# Patient Record
Sex: Male | Born: 1935 | Race: White | Hispanic: No | Smoking: Former smoker
Health system: Southern US, Community
[De-identification: ages and names within clinical notes are randomized; demographics above are authoritative.]

## PROBLEM LIST (undated history)

## (undated) DIAGNOSIS — Z9289 Personal history of other medical treatment: Secondary | ICD-10-CM

## (undated) DIAGNOSIS — I5189 Other ill-defined heart diseases: Secondary | ICD-10-CM

## (undated) DIAGNOSIS — I471 Supraventricular tachycardia, unspecified: Secondary | ICD-10-CM

## (undated) DIAGNOSIS — J8410 Pulmonary fibrosis, unspecified: Secondary | ICD-10-CM

## (undated) DIAGNOSIS — I951 Orthostatic hypotension: Secondary | ICD-10-CM

## (undated) DIAGNOSIS — I480 Paroxysmal atrial fibrillation: Secondary | ICD-10-CM

## (undated) DIAGNOSIS — E785 Hyperlipidemia, unspecified: Secondary | ICD-10-CM

## (undated) DIAGNOSIS — N400 Enlarged prostate without lower urinary tract symptoms: Secondary | ICD-10-CM

## (undated) DIAGNOSIS — Z950 Presence of cardiac pacemaker: Secondary | ICD-10-CM

## (undated) DIAGNOSIS — I10 Essential (primary) hypertension: Secondary | ICD-10-CM

## (undated) HISTORY — DX: Orthostatic hypotension: I95.1

## (undated) HISTORY — DX: Paroxysmal atrial fibrillation: I48.0

## (undated) HISTORY — DX: Pulmonary fibrosis, unspecified: J84.10

## (undated) HISTORY — DX: Presence of cardiac pacemaker: Z95.0

## (undated) HISTORY — PX: FOOT SURGERY: SHX648

## (undated) HISTORY — DX: Essential (primary) hypertension: I10

## (undated) HISTORY — PX: HERNIA REPAIR: SHX51

## (undated) HISTORY — PX: INSERT / REPLACE / REMOVE PACEMAKER: SUR710

## (undated) HISTORY — DX: Hyperlipidemia, unspecified: E78.5

## (undated) HISTORY — PX: BACK SURGERY: SHX140

---

## 2008-09-21 ENCOUNTER — Ambulatory Visit (HOSPITAL_BASED_OUTPATIENT_CLINIC_OR_DEPARTMENT_OTHER): Admission: RE | Admit: 2008-09-21 | Discharge: 2008-09-21 | Payer: Self-pay | Admitting: Orthopedic Surgery

## 2009-10-22 ENCOUNTER — Ambulatory Visit: Payer: Self-pay | Admitting: Unknown Physician Specialty

## 2009-11-19 ENCOUNTER — Encounter: Payer: Self-pay | Admitting: Cardiovascular Disease

## 2009-12-17 ENCOUNTER — Encounter: Payer: Self-pay | Admitting: Cardiovascular Disease

## 2009-12-31 ENCOUNTER — Ambulatory Visit: Payer: Self-pay | Admitting: Ophthalmology

## 2010-02-06 DIAGNOSIS — I10 Essential (primary) hypertension: Secondary | ICD-10-CM

## 2010-02-06 DIAGNOSIS — E785 Hyperlipidemia, unspecified: Secondary | ICD-10-CM

## 2010-06-07 ENCOUNTER — Emergency Department: Payer: Self-pay | Admitting: Emergency Medicine

## 2010-06-07 ENCOUNTER — Encounter: Payer: Self-pay | Admitting: Cardiovascular Disease

## 2010-06-20 ENCOUNTER — Ambulatory Visit: Payer: Self-pay | Admitting: Cardiovascular Disease

## 2010-06-20 DIAGNOSIS — I471 Supraventricular tachycardia: Secondary | ICD-10-CM

## 2010-07-29 ENCOUNTER — Inpatient Hospital Stay: Payer: Self-pay | Admitting: Internal Medicine

## 2010-08-02 ENCOUNTER — Emergency Department: Payer: Self-pay | Admitting: Emergency Medicine

## 2010-08-06 ENCOUNTER — Emergency Department: Payer: Self-pay | Admitting: Internal Medicine

## 2010-08-13 ENCOUNTER — Emergency Department: Payer: Self-pay | Admitting: Emergency Medicine

## 2010-08-26 ENCOUNTER — Emergency Department: Payer: Self-pay | Admitting: Emergency Medicine

## 2010-10-14 ENCOUNTER — Ambulatory Visit: Payer: Self-pay | Admitting: Cardiovascular Disease

## 2010-11-04 ENCOUNTER — Ambulatory Visit: Payer: Self-pay | Admitting: Ophthalmology

## 2010-12-17 NOTE — Assessment & Plan Note (Signed)
Summary: EKG FOR SURGICAL CLEARANCE  Nurse Visit   Vital Signs:  Patient profile:   77 year old male Height:      69 inches Weight:      165 pounds BMI:     24.45 Pulse rate:   71 / minute BP sitting:   140 / 756  (left arm) Cuff size:   regular  Vitals Entered By: Bishop Dublin, CMA (October 14, 2010 3:23 PM)  Past History:  Past Medical History: Last updated: 02/06/2010 hx of tachypalpitations Hyperlipidemia Hypertension  Past Surgical History: Last updated: 06/20/2010 Foot surg. March 47829. Back surg. (lower back surg.) hernia repair  Family History: Last updated: 06/20/2010 Family History of Coronary Artery Disease:   Social History: Last updated: 06/20/2010 Retired  Married  Tobacco Use - No.  Alcohol Use - no Regular Exercise - yes Drug Use - no  Risk Factors: Exercise: yes (06/20/2010)  Risk Factors: Smoking Status: never (06/20/2010)   Impression & Recommendations:  Problem # 1:  PRE-OPERATIVE CARDIAC EXAM (ICD-V72.81) He is low risk for upcoming cataract surgery. Can proceed without further delay.   His updated medication list for this problem includes:    Cardizem La 180 Mg Xr24h-tab (Diltiazem hcl coated beads) .Marland Kitchen... 1 once daily    Aspirin 81 Mg Tbec (Aspirin) .Marland Kitchen... 1 once daily    Diltiazem Hcl Er Beads 120 Mg Xr24h-cap (Diltiazem hcl er beads) .Marland Kitchen... Take one capsule by mouth daily    Visit Type:  Nurse visit  CC:  EKG for surgical clearance cataract surgery with Dr. Brett Canales Dingeldein at [facility]  Surgery scheduled for Dec. 19 and 2011..   Current Medications (verified): 1)  Prevacid 30 Mg Cpdr (Lansoprazole) .Marland Kitchen.. 1 Once Daily 2)  Cardizem La 180 Mg Xr24h-Tab (Diltiazem Hcl Coated Beads) .Marland Kitchen.. 1 Once Daily 3)  Aspirin 81 Mg Tbec (Aspirin) .Marland Kitchen.. 1 Once Daily 4)  Saw Palmetto 160 Mg Caps (Saw Palmetto (Serenoa Repens)) .Marland Kitchen.. 1 Once Daily 5)  Glucosamine-Chondroitin 500-400 Mg Caps (Glucosamine-Chondroitin) .Marland Kitchen.. 1 Once  Daily 6)  Garlic Oil 1000 Mg Caps (Garlic) .Marland Kitchen.. 1 Once Daily 7)  Centrum Silver  Tabs (Multiple Vitamins-Minerals) .Marland Kitchen.. 1 Once Daily 8)  Calcium-D 600-200 Mg-Unit Tabs (Calcium Carbonate-Vitamin D) .Marland Kitchen.. 1 Once Daily 9)  Vitamin E .... 1 Once Daily 10)  Diltiazem Hcl Er Beads 120 Mg Xr24h-Cap (Diltiazem Hcl Er Beads) .... Take One Capsule By Mouth Daily 11)  Simvastatin 20 Mg Tabs (Simvastatin) .... Take One Tablet By Mouth Daily At Bedtime  Allergies (verified): No Known Drug Allergies  Appended Document: EKG FOR SURGICAL CLEARANCE Faxed nurse visit to [facility] with Dr. Clementeen Hoof for cataract clearance surgery.

## 2010-12-17 NOTE — Letter (Signed)
Summary: Medical Record Release  Medical Record Release   Imported By: Harlon Flor 02/08/2010 08:01:56  _____________________________________________________________________  External Attachment:    Type:   Image     Comment:   External Document

## 2010-12-17 NOTE — Assessment & Plan Note (Signed)
Summary: ROV   Visit Type:  Follow-up Primary [name]:  Dr.Walker  CC:  c/o getting dizzy the other day with nausea and vomiting and had passed out.  Went to [facility] two weeks ago.Marland Kitchen  History of Present Illness: 77 yo gentleman with a h/o of tachypalpitations, well controlled on diltiazem, hyperlipidemia, GERD, esophageal stricture, presenting for new patient evaluation. He was last seen by myself at [facility] on 11/2009.  He reports that he had a recent epsiode of dizziness for which he took NTG x2 thinking it was his heart. He had worked hard mowing the day before in the hot sun. After the NTG, he felt worse, had N/V and called EMS. He believes he had syncope for 30 seconds.  His BP was very low, pulse in the 50s (his baseline). He was evaluated in the ER and discharged hime once he was feeling better. He did not want to stay and be admitted for cardiac eval.  Today he feels well. No further episodes, though his heart rate is in the 50s. He does have rare palpitation on diltiazem 180 mg daily.   EKG: NSR with rate of 60 bpm, RBBB, APC noted  Preventive Screening-Counseling & Management  Alcohol-Tobacco     Smoking Status: never  Caffeine-Diet-Exercise     Does Patient Exercise: yes      Drug Use:  no.    Current Medications (verified): 1)  Prevacid 30 Mg Cpdr (Lansoprazole) .Marland Kitchen.. 1 Once Daily 2)  Cardizem La 180 Mg Xr24h-Tab (Diltiazem Hcl Coated Beads) .Marland Kitchen.. 1 Once Daily 3)  Aspirin 81 Mg Tbec (Aspirin) .Marland Kitchen.. 1 Once Daily 4)  Saw Palmetto 160 Mg Caps (Saw Palmetto (Serenoa Repens)) .Marland Kitchen.. 1 Once Daily 5)  Glucosamine-Chondroitin 500-400 Mg Caps (Glucosamine-Chondroitin) .Marland Kitchen.. 1 Once Daily 6)  Garlic Oil 1000 Mg Caps (Garlic) .Marland Kitchen.. 1 Once Daily 7)  Centrum Silver  Tabs (Multiple Vitamins-Minerals) .Marland Kitchen.. 1 Once Daily 8)  Calcium-D 600-200 Mg-Unit Tabs (Calcium Carbonate-Vitamin D) .Marland Kitchen.. 1 Once Daily 9)  Vitamin E .... 1 Once Daily  Allergies (verified): No Known Drug Allergies  Past  History:  Past Surgical History: Foot surg. March 40981. Back surg. (lower back surg.) hernia repair  Family History: Family History of Coronary Artery Disease:   Social History: Retired  Married  Tobacco Use - No.  Alcohol Use - no Regular Exercise - yes Drug Use - no Smoking Status:  never Does Patient Exercise:  yes Drug Use:  no  Review of Systems  The patient denies fever, weight loss, weight gain, vision loss, decreased hearing, hoarseness, chest pain, syncope, dyspnea on exertion, peripheral edema, prolonged cough, abdominal pain, incontinence, muscle weakness, depression, and enlarged lymph nodes.         recent syncope/near syncope though now resolved  Vital Signs:  Patient profile:   77 year old male Height:      69 inches Weight:      166 pounds BMI:     24.60 Pulse rate:   62 / minute BP sitting:   132 / 73  (left arm) Cuff size:   regular  Vitals Entered By: Bishop Dublin, CMA (June 20, 2010 11:17 AM)  Physical Exam  General:  Well developed, well nourished, in no acute distress. Head:  normocephalic and atraumatic Neck:  Neck supple, no JVD. No masses, thyromegaly or abnormal cervical nodes. Lungs:  Clear bilaterally to auscultation and percussion. Heart:  Non-displaced PMI, chest non-tender; regular rate and rhythm, S1, S2 without murmurs, rubs or gallops. Carotid  upstroke normal, no bruit.  Pedals normal pulses. No edema, no varicosities. Abdomen:  Bowel sounds positive; abdomen soft and non-tender without masses Msk:  Back normal, normal gait. Muscle strength and tone normal. Pulses:  pulses normal in all 4 extremities Extremities:  No clubbing or cyanosis. Neurologic:  Alert and oriented x 3. Skin:  Intact without lesions or rashes. Psych:  Normal affect.   Impression & Recommendations:  Problem # 1:  SVT/ PSVT/ PAT (ICD-427.0) Rare episodes of APCS. Given his recent syncope/near syncope (likely exacerbated by taking NTG sl x2), we wil  decrase his diltazem to 120 mg daily. I have asked him to use his diltiazem 30 mg as needed for palpitations.  His updated medication list for this problem includes:    Cardizem La 180 Mg Xr24h-tab (Diltiazem hcl coated beads) .Marland Kitchen... 1 once daily    Aspirin 81 Mg Tbec (Aspirin) .Marland Kitchen... 1 once daily    Diltiazem Hcl Er Beads 120 Mg Xr24h-cap (Diltiazem hcl er beads) .Marland Kitchen... Take one capsule by mouth daily  Problem # 2:  HYPERTENSION (ICD-401.9) Well controlled on todays visit. We ae decreasing the diltiazem to 120 mg dailt. He is on ASA. Nonsmoker.   His updated medication list for this problem includes:    Cardizem La 180 Mg Xr24h-tab (Diltiazem hcl coated beads) .Marland Kitchen... 1 once daily    Aspirin 81 Mg Tbec (Aspirin) .Marland Kitchen... 1 once daily    Diltiazem Hcl Er Beads 120 Mg Xr24h-cap (Diltiazem hcl er beads) .Marland Kitchen... Take one capsule by mouth daily  Problem # 3:  HYPERLIPIDEMIA (ICD-272.4) We will check his cholesterol at his convenience in our office. He reports he does not have a follow up a this time with Dr. Dan Humphreys.  His updated medication list for this problem includes:    Simvastatin 20 Mg Tabs (Simvastatin) .Marland Kitchen... Take one tablet by mouth daily at bedtime  Other Orders: EKG w/ Interpretation (93000)  Patient Instructions: 1)  Your physician has recommended you make the following change in your medication: START diltiazem 120mg  daily 2)  Your physician wants you to follow-up in:  1 year  You will receive a reminder letter in the mail two months in advance. If you don't receive a letter, please call our office to schedule the follow-up appointment. Prescriptions: DILTIAZEM HCL ER BEADS 120 MG XR24H-CAP (DILTIAZEM HCL ER BEADS) Take one capsule by mouth daily  #90 x 4   Entered by:   Benedict Needy, RN   Authorized by:   Dossie Arbour MD   Signed by:   Benedict Needy, RN on 06/20/2010   Method used:   Electronically to        [facility]* (retail)             ,          Ph: [phone]        Fax: [phone]   RxID:   [id number]

## 2010-12-17 NOTE — Letter (Signed)
Summary: SE NOTE  SE NOTE   Imported By: Frazier Butt Chriscoe 06/07/2010 13:59:56  _____________________________________________________________________  External Attachment:    Type:   Image     Comment:   External Document

## 2010-12-17 NOTE — Consult Note (Signed)
Summary: Consultation Report  Consultation Report   Imported By: Park Breed 06/20/2010 14:20:21  _____________________________________________________________________  External Attachment:    Type:   Image     Comment:   External Document

## 2010-12-30 ENCOUNTER — Encounter: Payer: Self-pay | Admitting: Cardiovascular Disease

## 2011-01-08 NOTE — Miscellaneous (Signed)
Summary: Rx Refill  Clinical Lists Changes  Medications: Changed medication from DILTIAZEM HCL ER BEADS 120 MG XR24H-CAP (DILTIAZEM HCL ER BEADS) Take one capsule by mouth daily to DILTIAZEM HCL ER BEADS 120 MG XR24H-CAP (DILTIAZEM HCL ER BEADS) Take one capsule by mouth daily - Signed Added new medication of CARDIZEM 30 MG TABS (DILTIAZEM HCL) Take one tablet by mouth once daily. - Signed Changed medication from SIMVASTATIN 20 MG TABS (SIMVASTATIN) Take one tablet by mouth daily at bedtime to SIMVASTATIN 20 MG TABS (SIMVASTATIN) Take one tablet by mouth daily at bedtime - Signed Rx of DILTIAZEM HCL ER BEADS 120 MG XR24H-CAP (DILTIAZEM HCL ER BEADS) Take one capsule by mouth daily;  #90 x 3;  Signed;  Entered by: Lanny Hurst RN;  Authorized by: Dossie Arbour MD;  Method used: Print then Give to Patient Rx of CARDIZEM 30 MG TABS (DILTIAZEM HCL) Take one tablet by mouth once daily.;  #90 x 3;  Signed;  Entered by: Lanny Hurst RN;  Authorized by: Dossie Arbour MD;  Method used: Print then Give to Patient Rx of SIMVASTATIN 20 MG TABS (SIMVASTATIN) Take one tablet by mouth daily at bedtime;  #90 x 3;  Signed;  Entered by: Lanny Hurst RN;  Authorized by: Dossie Arbour MD;  Method used: Print then Give to Patient Rx of PREVACID 30 MG CPDR (LANSOPRAZOLE) 1 once daily;  #90 x 3;  Signed;  Entered by: Lanny Hurst RN;  Authorized by: Dossie Arbour MD;  Method used: Print then Give to Patient    Prescriptions: PREVACID 30 MG CPDR (LANSOPRAZOLE) 1 once daily  #90 x 3   Entered by:   Lanny Hurst RN   Authorized by:   Dossie Arbour MD   Signed by:   Lanny Hurst RN on 12/30/2010   Method used:   Print then Give to Patient   RxID:   [id number] SIMVASTATIN 20 MG TABS (SIMVASTATIN) Take one tablet by mouth daily at bedtime  #90 x 3   Entered by:   Lanny Hurst RN   Authorized by:   Dossie Arbour MD   Signed by:   Lanny Hurst RN on 12/30/2010   Method used:   Print then Give to Patient   RxID:   [id number] CARDIZEM  30 MG TABS (DILTIAZEM HCL) Take one tablet by mouth once daily.  #90 x 3   Entered by:   Lanny Hurst RN   Authorized by:   Dossie Arbour MD   Signed by:   Lanny Hurst RN on 12/30/2010   Method used:   Print then Give to Patient   RxID:   [id number] DILTIAZEM HCL ER BEADS 120 MG XR24H-CAP (DILTIAZEM HCL ER BEADS) Take one capsule by mouth daily  #90 x 3   Entered by:   Lanny Hurst RN   Authorized by:   Dossie Arbour MD   Signed by:   Lanny Hurst RN on 12/30/2010   Method used:   Print then Give to Patient   RxID:   [id number]

## 2011-03-10 ENCOUNTER — Telehealth: Payer: Self-pay | Admitting: Cardiovascular Disease

## 2011-03-10 NOTE — Telephone Encounter (Signed)
[facility] has informed that pt that Simvastatin counteracts with Diltiazem.  Can the pt take Lipitor?

## 2011-03-11 ENCOUNTER — Telehealth: Payer: Self-pay

## 2011-03-11 NOTE — Telephone Encounter (Signed)
Looks like he might be overdue for a visit?

## 2011-03-11 NOTE — Telephone Encounter (Signed)
Would continue diltiazem with simva 20. Will discuss with him on next visit.

## 2011-03-11 NOTE — Telephone Encounter (Signed)
Dr. Mariah Milling does this patient need to switch the simvastatin to lipitor?

## 2011-03-12 ENCOUNTER — Telehealth: Payer: Self-pay

## 2011-03-12 MED ORDER — SIMVASTATIN 20 MG PO TABS: 20.0000 mg | ORAL_TABLET | Freq: Every day | ORAL | Status: DC

## 2011-03-12 NOTE — Telephone Encounter (Signed)
Notified patient it is okay for him to take the diltiazem and simvastatin together.

## 2011-03-12 NOTE — Telephone Encounter (Signed)
Notified patient okay to take the diltiazem and simvastatin together.  Also made an appointment with Dr. Mariah Milling for Mar 21, 2011.

## 2011-03-12 NOTE — Telephone Encounter (Signed)
Notified patient need to make an appointment/scheduled for Mar 21, 2011.

## 2011-03-21 ENCOUNTER — Encounter: Payer: Self-pay | Admitting: Cardiovascular Disease

## 2011-03-21 ENCOUNTER — Ambulatory Visit (INDEPENDENT_AMBULATORY_CARE_PROVIDER_SITE_OTHER): Payer: Medicare Other | Admitting: Cardiovascular Disease

## 2011-03-21 DIAGNOSIS — I471 Supraventricular tachycardia: Secondary | ICD-10-CM

## 2011-03-21 DIAGNOSIS — E785 Hyperlipidemia, unspecified: Secondary | ICD-10-CM

## 2011-03-21 DIAGNOSIS — I10 Essential (primary) hypertension: Secondary | ICD-10-CM

## 2011-03-21 NOTE — Progress Notes (Signed)
   Patient ID: Walter Townsend, male    DOB: Apr 05, 1925, 77 y.o.   MRN: [id number]  HPI Comments: 77 yo gentleman with a h/o of tachypalpitations, well controlled on diltiazem, hyperlipidemia, GERD, esophageal stricture, presenting for Routine followup.  He reports that he had been doing well until his wife started having problems. She had confusion and leg weakness. He was helping her after she started to collapse and he pulled his back after trying to lift her. He's using a crutch and has significant left leg discomfort with mild numbness in the upper leg. He is being monitored closely and is on nonsteroidal anti-inflammatories. He has noticed mild improvement in his symptoms. This happened previously and it resolved without intervention.  He does have palpitations that he does appreciate. They are stable though bothersome. He denies any rapid rhythms indicating SVT as he has had in the past. He does have frequent PVCs on diltiazem 180 mg daily.  EKG today shows normal sinus rhythm with right bundle branch block,  PVCs noted.      Review of Systems  Constitutional: Negative.   HENT: Negative.   Eyes: Negative.   Respiratory: Negative.   Cardiovascular: Positive for palpitations.  Gastrointestinal: Negative.   Musculoskeletal: Positive for back pain.  Skin: Negative.   Neurological: Negative.   Hematological: Negative.   Psychiatric/Behavioral: Negative.   [all other systems reviewed and are negative   BP 120/60  Pulse 61  Ht 5\' 9"  (1.753 m)  Wt 162 lb (73.483 kg)  BMI 23.92 kg/m2   Physical Exam  [nursing notereviewed. Constitutional: He is oriented to person, place, and time. He appears well-developed and well-nourished.  HENT:  Head: Normocephalic.  Nose: Nose normal.  Mouth/Throat: Oropharynx is clear and moist.  Eyes: Conjunctivae are normal. Pupils are equal, round, and reactive to light.  Neck: Normal range of motion. Neck supple. No JVD present.  Cardiovascular:  Normal rate, regular rhythm, S1 normal, S2 normal, normal heart sounds and intact distal pulses.  Exam reveals no gallop and no friction rub.   No murmur heard.      Trace edema  Pulmonary/Chest: Effort normal and breath sounds normal. No respiratory distress. He has no wheezes. He has no rales. He exhibits no tenderness.  Abdominal: Soft. Bowel sounds are normal. He exhibits no distension. There is no tenderness.  Musculoskeletal: Normal range of motion. He exhibits no edema and no tenderness.  Lymphadenopathy:    He has no cervical adenopathy.  Neurological: He is alert and oriented to person, place, and time. Coordination normal.  Skin: Skin is warm and dry. No rash noted. No erythema.  Psychiatric: He has a normal mood and affect. His behavior is normal. Judgment and thought content normal.           Assessment and Plan

## 2011-03-21 NOTE — Assessment & Plan Note (Signed)
Given that there is an interaction between diltiazem and simvastatin, he will finish the simvastatin that he has and then changed to Lipitor 20 mg daily. His wife is not taking her Lipitor and he will start her medication until out runs out and then call our office for a refill.

## 2011-03-21 NOTE — Assessment & Plan Note (Signed)
He does continue to have PVCs. These are somewhat bothersome at times. It lasted to contact our office if these get worse.

## 2011-03-21 NOTE — Assessment & Plan Note (Signed)
Blood pressure is well controlled on today's visit. No changes made to the medications. 

## 2011-03-21 NOTE — Patient Instructions (Signed)
You are doing well. No medication changes were made. Please call us if you have new issues that need to be addressed before your next appt.  We will call you for a follow up Appt. In 6 months  

## 2011-04-01 NOTE — Op Note (Signed)
Walter Townsend, Walter Townsend             ACCOUNT NO.:  [id number]   MEDICAL RECORD NO.:  [id number]          PATIENT TYPE:  AMB   LOCATION:  [facility]                          FACILITY:  [facility]   PHYSICIAN:  Loreta Ave, M.D. DATE OF BIRTH:  [birth year]   DATE OF PROCEDURE:  09/21/2008  DATE OF DISCHARGE:                               OPERATIVE REPORT   PREOPERATIVE DIAGNOSIS:  Nonunion shaft fracture, fifth metatarsal,  right foot.   PROCEDURE:  Multiple drilling across nonunion site and fixation with a  4.5-mm cannulated screw.   SURGEON:  Loreta Ave, MD   ASSISTANT:  Genene Churn. Barry Dienes, [place]   ANESTHESIA:  General.   BLOOD LOSS:  Minimal.   SPECIMENS:  None.   CULTURES:  None.   COMPLICATIONS:  None.   PROCEDURE:  Sterile compressive with Cam walker.   TOURNIQUET TIME:  30 minutes.   PROCEDURE:  The patient was brought to the operating room and placed on  the operating table in the supine position.  After adequate anesthesia  had been obtained, tourniquet was applied to right leg.  Prepped and  draped in the usual sterile fashion.  Exsanguinated with elevation and  Esmarch tourniquet inflated.  Fluoroscopic guidance throughout.  A  longitudinal incision at the base of fifth metatarsal.  A guidewire was  then passed across the nonunion site multiple times to break down any  areas of sclerosis.  The guidewire was then passed distally entering  into the shaft confirming good placement on all views fluoroscopically.  Drilled, measured, and then fixed with a 4.0 mm cannulated lag screw.  Countersunk at the base.  Firm fixation with excellent alignment and  compression.  Once this was confirmed, wound was irrigated and closed  with nylon.  Sterile compressive dressing applied.  Cam walker applied.  Tourniquet was deflated and removed.  Anesthesia reversed.  Brought to  the recovery room.  Tolerated the surgery well.  No complications.     Loreta Ave, M.D.  Electronically Signed    DFM/MEDQ  D:  09/21/2008  T:  09/22/2008  Job:  [id number]

## 2011-08-19 LAB — POCT I-STAT, CHEM 8
BUN: 14
Chloride: 108
Creatinine, Ser: 1.2
Glucose, Bld: 109 — ABNORMAL HIGH
Hemoglobin: 14.3
Potassium: 4.3
Sodium: 139

## 2011-09-23 ENCOUNTER — Encounter: Payer: Self-pay | Admitting: Cardiovascular Disease

## 2011-09-25 ENCOUNTER — Encounter: Payer: Self-pay | Admitting: Cardiovascular Disease

## 2011-09-25 ENCOUNTER — Ambulatory Visit (INDEPENDENT_AMBULATORY_CARE_PROVIDER_SITE_OTHER): Payer: Medicare Other | Admitting: Cardiovascular Disease

## 2011-09-25 VITALS — BP 152/74 | HR 72 | Ht 69.0 in | Wt 164.0 lb

## 2011-09-25 DIAGNOSIS — R05 Cough: Secondary | ICD-10-CM

## 2011-09-25 DIAGNOSIS — E785 Hyperlipidemia, unspecified: Secondary | ICD-10-CM

## 2011-09-25 DIAGNOSIS — I471 Supraventricular tachycardia, unspecified: Secondary | ICD-10-CM

## 2011-09-25 DIAGNOSIS — R059 Cough, unspecified: Secondary | ICD-10-CM

## 2011-09-25 DIAGNOSIS — I10 Essential (primary) hypertension: Secondary | ICD-10-CM

## 2011-09-25 DIAGNOSIS — R0602 Shortness of breath: Secondary | ICD-10-CM

## 2011-09-25 DIAGNOSIS — J8410 Pulmonary fibrosis, unspecified: Secondary | ICD-10-CM

## 2011-09-25 MED ORDER — FUROSEMIDE 20 MG PO TABS: 20.0000 mg | ORAL_TABLET | Freq: Every day | ORAL | Status: DC | PRN

## 2011-09-25 NOTE — Patient Instructions (Addendum)
You are doing well. Please start lasix 20mg  every other day for shortness of breath Stop lasix when shortness of breath is better, then take as  Needed  Please call us if you have new issues that need to be addressed before your next appt.  Your physician recommends that you schedule a follow-up appointment in: 3 months  A chest x-ray takes a picture of the organs and structures inside the chest, including the heart, lungs, and blood vessels. This test can show several things, including, whether the heart is enlarges; whether fluid is building up in the lungs. We will call you with results.

## 2011-09-25 NOTE — Assessment & Plan Note (Signed)
Cholesterol is at goal on the current lipid regimen. No changes to the medications were made.  

## 2011-09-25 NOTE — Assessment & Plan Note (Signed)
Worsening shortness of breath. We do not have his old chest x-ray available to [place] for review today. Chest x-ray done today does show bilateral opacities concerning for pulmonary fibrosis or pneumonitis.   We have prescribed Lasix p.r.n. While the x-ray was pending. I'm not sure that this will help a significant amount of could be worth a try for his symptoms. We have suggested he try this every other day for the first week. We will also check a basic metabolic panel and BNP. We will ask him if he would like referral to pulmonology.

## 2011-09-25 NOTE — Assessment & Plan Note (Signed)
Blood pressure is well controlled on today's visit. No changes made to the medications. 

## 2011-09-25 NOTE — Assessment & Plan Note (Signed)
Arrhythmia improved on diltiazem.

## 2011-09-25 NOTE — Progress Notes (Signed)
Patient ID: Walter Townsend, male    DOB: March 03, 1925, 77 y.o.   MRN: [id number]  HPI Comments: 77 yo gentleman with a h/o of tachypalpitations, well controlled on diltiazem, hyperlipidemia, GERD, esophageal stricture, Presenting for routine followup.  He reports that his wife passed away last night. She has been in hospice for several months now. She has vascular dementia and finally succumbed. He was taking care of her at home for several months before she moved back to hospice recently.  He does report increasing shortness of breath over the past several weeks. He denies any significant change in his edema but he does have trace bilateral edema. He does drink a reasonable amount of fluids and reports that he urinates frequently but he attributes this to his prostate. He only smoked for 4 years as a teenager but did have significant secondhand smoke.   Today he feels well. No further episodes, though his heart rate is in the 50s. He does have rare palpitation on diltiazem 180 mg daily.   EKG: NSR with rate of 72 bpm, RBBB, APC noted in Trigeminal pattern    Outpatient Encounter Prescriptions as of 09/25/2011  Medication Sig Dispense Refill  . aspirin 81 MG EC tablet Take 81 mg by mouth daily.        . Calcium Carbonate-Vitamin D (CALCIUM-VITAMIN D) 600-200 MG-UNIT CAPS Take 1 capsule by mouth daily.        Marland Kitchen diltiazem (CARDIZEM CD) 180 MG 24 hr capsule Take 180 mg by mouth daily as needed.        . diltiazem (CARDIZEM) 30 MG tablet Take 30 mg by mouth 1 dose over 46 hours.        Marland Kitchen diltiazem (DILACOR XR) 120 MG 24 hr capsule Take 120 mg by mouth daily.        . Garlic Oil 1000 MG CAPS Take 1 capsule by mouth daily.        Marland Kitchen glucosamine-chondroitin 500-400 MG tablet Take 1 tablet by mouth daily.        . lansoprazole (PREVACID) 30 MG capsule Take 30 mg by mouth daily.        . Multiple Vitamin (MULTIVITAMIN) tablet Take 1 tablet by mouth daily.        . saw palmetto 160 MG capsule Take 160  mg by mouth daily.        . simvastatin (ZOCOR) 20 MG tablet Take 20 mg by mouth at bedtime.        . vitamin E 1000 UNIT capsule Take 1,000 Units by mouth daily.          Review of Systems  Constitutional: Negative.   HENT: Negative.   Eyes: Negative.   Respiratory: Positive for shortness of breath.   Cardiovascular: Negative.   Gastrointestinal: Negative.   Musculoskeletal: Negative.   Skin: Negative.   Neurological: Negative.   Hematological: Negative.   Psychiatric/Behavioral: Negative.   All other systems reviewed and are negative.    BP 152/74  Pulse 72  Ht 5\' 9"  (1.753 m)  Wt 164 lb (74.39 kg)  BMI 24.22 kg/m2  Physical Exam  Nursing note and vitals reviewed. Constitutional: He is oriented to person, place, and time. He appears well-developed and well-nourished.  HENT:  Head: Normocephalic.  Nose: Nose normal.  Mouth/Throat: Oropharynx is clear and moist.  Eyes: Conjunctivae are normal. Pupils are equal, round, and reactive to light.  Neck: Normal range of motion. Neck supple. No JVD present.  Cardiovascular: Normal rate,  regular rhythm, S1 normal, S2 normal, normal heart sounds and intact distal pulses.  Exam reveals no gallop and no friction rub.   No murmur heard. Pulmonary/Chest: Effort normal. No respiratory distress. He has no wheezes. He has rales. He exhibits no tenderness.       Most notably in the right base  Abdominal: Soft. Bowel sounds are normal. He exhibits no distension. There is no tenderness.  Musculoskeletal: Normal range of motion. He exhibits no edema and no tenderness.  Lymphadenopathy:    He has no cervical adenopathy.  Neurological: He is alert and oriented to person, place, and time. Coordination normal.  Skin: Skin is warm and dry. No rash noted. No erythema.  Psychiatric: He has a normal mood and affect. His behavior is normal. Judgment and thought content normal.           Assessment and Plan

## 2011-09-26 LAB — BASIC METABOLIC PANEL
BUN: 17 mg/dL (ref 8–27)
CO2: 20 mmol/L (ref 20–32)
Calcium: 9.7 mg/dL (ref 8.6–10.2)
Chloride: 101 mmol/L (ref 97–108)
Glucose: 100 mg/dL — ABNORMAL HIGH (ref 65–99)
Potassium: 4.4 mmol/L (ref 3.5–5.2)

## 2011-09-26 LAB — BRAIN NATRIURETIC PEPTIDE: BNP: 87.9 pg/mL (ref 0.0–100.0)

## 2011-10-06 ENCOUNTER — Encounter: Payer: Self-pay | Admitting: Pulmonary Disease

## 2011-10-06 ENCOUNTER — Ambulatory Visit (INDEPENDENT_AMBULATORY_CARE_PROVIDER_SITE_OTHER): Payer: Medicare Other | Admitting: Pulmonary Disease

## 2011-10-06 VITALS — BP 112/66 | HR 70 | Temp 97.9°F | Ht 69.0 in | Wt 167.0 lb

## 2011-10-06 DIAGNOSIS — J8410 Pulmonary fibrosis, unspecified: Secondary | ICD-10-CM

## 2011-10-06 NOTE — Patient Instructions (Signed)
You have pulmonary fibrosis or scarring in the lungs.   To help [place] better evaluate this, you need to have a CT chest, a barium swallow, and full pulmonary function tests.  We would prefer to do these tests this week, but per your wishes we will order then for early January, 2013.  We will see you back in January 2013.

## 2011-10-06 NOTE — Assessment & Plan Note (Signed)
Mr. Jarrard has dyspnea, cough, crackles on exam and imaging findings worrisome for pulmonary fibrosis.  I do not believe that he is volume overloaded on exam today, and a recent BNP sent by Dr. Mariah Milling was less than 100.  I explained to Mr. Deboer that I believe he likely does have some degree of pulmonary fibrosis and to better characterize it he should have a CT thorax and full PFTs.  He has no compelling evidence of an underlying connective tissue disorder with the exception of the prior esophageal disease.  Chronic aspiration is a reasonable consideration considering this.    I offered a CT thorax, full PFTs, and a barium swallow to be performed this week with follow up in one week, but unfortunately Mr. Meddaugh declined.  He believes that he is just out of shape and needs to focus on caring for his sister right now.  He wants to put off the work up until after Christmas.  I spent a considerable amount of time in clinic advising him that I thought an immediate work up was appropriate but despite my best efforts he refused.  I advised him that we would be happy to see him sooner than January 2013 if he changes his mind, and he said that he would think about it.

## 2011-10-06 NOTE — Progress Notes (Signed)
Subjective:    Patient ID: Walter Townsend, male    DOB: 1925/01/31, 77 y.o.   MRN: [id number]  HPI 77 y/o male referred for possible pulmonary fibrosis.    He states that he was first told that he had an abnormal chest exam 4-5 years ago.  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  He continued to remain asymptomatic, exercising by walking up to two miles a day until April of 2011.  At that time he broke his R foot and stopped exercising so much.  After recovering his wife developed dementia and for the majority of 2012 he has been caring for her daily.  This has involved direct care of her basic needs during which time he has neglected to exercise.  Sadly, she died on Oct 05, 2011 which he says has been causing him stress but he has been focusing on caring for other family members to help take his mind off of it.  Three weeks ago he tried to walk on the treadmill and found that he was dyspnic after only five minutes.  He mentioned this to Walter Townsend (with whom he follows for SVT) and a CXR was ordered which showed interstitial pneumonitis vs. Fibrosis.  He states that he can walk for five minutes at a time without getting short of breath, but still is capable of doing his ADL's and yardwork.  He has had a dry cough since mulching leaves one month ago.  No fevers, chills, chest pain or significant swelling.   He states that in years prior he has had trouble with food "hanging" in his esophagus, but to his knowledge this was cured by prevacid alone.  He denies choking or dysphagia.  Past Medical History  Diagnosis Date  . Rapid palpitations   . Hyperlipidemia   . Hypertension      Family History  Problem Relation Age of Onset  . Heart disease Other   . Coronary artery disease Other      History   Social History  . Marital Status: Married    Spouse Name: N/A    Number of  Children: N/A  . Years of Education: N/A   Occupational History  . Not on file.   Social History Main Topics  . Smoking status: Former Smoker -- 0.3 packs/day for 3 years    Types: Cigarettes    Quit date: 11/17/1948  . Smokeless tobacco: Never Used  . Alcohol Use: No  . Drug Use: No  . Sexually Active: Not on file   Other Topics Concern  . 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  He as never used pesticides.  His house is not moldy, he has no pets.       No Known Allergies   Outpatient Prescriptions Prior to Visit  Medication Sig Dispense Refill  . aspirin 81 MG EC tablet Take 81 mg by mouth daily.        . Calcium Carbonate-Vitamin D (CALCIUM-VITAMIN D) 600-200 MG-UNIT CAPS Take 1 capsule by mouth daily.        Marland Kitchen  diltiazem (CARDIZEM CD) 180 MG 24 hr capsule Take 180 mg by mouth daily as needed.        . diltiazem (CARDIZEM) 30 MG tablet Take 30 mg by mouth 1 dose over 46 hours.        Marland Kitchen diltiazem (DILACOR XR) 120 MG 24 hr capsule Take 120 mg by mouth daily.        . furosemide (LASIX) 20 MG tablet Take 1 tablet (20 mg total) by mouth daily as needed.  30 tablet  11  . Garlic Oil 1000 MG CAPS Take 1 capsule by mouth daily.        Marland Kitchen glucosamine-chondroitin 500-400 MG tablet Take 1 tablet by mouth daily.        . lansoprazole (PREVACID) 30 MG capsule Take 30 mg by mouth daily.        . Multiple Vitamin (MULTIVITAMIN) tablet Take 1 tablet by mouth daily.        . saw palmetto 160 MG capsule Take 160 mg by mouth daily.        . simvastatin (ZOCOR) 20 MG tablet Take 20 mg by mouth at bedtime.        . vitamin E 1000 UNIT capsule Take 1,000 Units by mouth daily.              Review of Systems    Constitutional: Positive for activity change. Negative for fever, chills, appetite change and unexpected weight change.  HENT: Negative for congestion, sore throat, rhinorrhea, sneezing, trouble swallowing, dental problem, voice change and postnasal drip.   Eyes: Negative for visual disturbance.  Respiratory: Positive for cough and shortness of breath. Negative for choking.   Cardiovascular: Negative for chest pain and leg swelling.  Gastrointestinal: Negative for nausea, vomiting and abdominal pain.  Genitourinary: Negative for difficulty urinating.  Musculoskeletal: Positive for arthralgias.  Skin: Negative for rash.  Psychiatric/Behavioral: Negative for behavioral problems and confusion.       Objective:   Physical Exam  Filed Vitals:   10/06/11 1447  BP: 112/66  Pulse: 70  Temp: 97.9 F (36.6 C)  TempSrc: Oral  Height: 5\' 9"  (1.753 m)  Weight: 167 lb (75.751 kg)  SpO2: 97%   He walked for two minutes in the office and his O2 sats did not drop below 90%.  Gen: well appearing, no acute distress HEENT: NCAT, PERRL, EOMi, OP clear, neck supple without masses PULM: Insp crackles throughout bilateral bases CV: RRR, systolic murmur left upper sternal border, no JVD AB: BS+, soft, nontender, no hsm Ext: warm, no edema, no clubbing, no cyanosis Derm: no rash or skin breakdown Neuro: A&Ox4, CN II-XII intact, strength 5/5 in all 4 extremeties      Assessment & Plan:   Pulmonary fibrosis Walter Townsend has dyspnea, cough, crackles on exam and imaging findings worrisome for pulmonary fibrosis.  I do not believe that he is volume overloaded on exam today, and a recent BNP sent by Walter Townsend was less than 100.  I explained to Walter Townsend that I believe he likely does have some degree of pulmonary fibrosis and to better characterize it he should have a CT thorax and full PFTs.  He has no compelling evidence of an underlying connective tissue disorder with the exception of the prior  esophageal disease.  Chronic aspiration is a reasonable consideration considering this.    I offered a CT thorax, full PFTs, and a barium swallow to be performed this week with follow up in one week,  but unfortunately Mr. Garriga declined.  He believes that he is just out of shape and needs to focus on caring for his sister right now.  He wants to put off the work up until after Christmas.  I spent a considerable amount of time in clinic advising him that I thought an immediate work up was appropriate but despite my best efforts he refused.  I advised him that we would be happy to see him sooner than January 2013 if he changes his mind, and he said that he would think about it.    Updated Medication List Outpatient Encounter Prescriptions as of 10/06/2011  Medication Sig Dispense Refill  . aspirin 81 MG EC tablet Take 81 mg by mouth daily.        . Calcium Carbonate-Vitamin D (CALCIUM-VITAMIN D) 600-200 MG-UNIT CAPS Take 1 capsule by mouth daily.        Marland Kitchen diltiazem (CARDIZEM CD) 180 MG 24 hr capsule Take 180 mg by mouth daily as needed.        . diltiazem (CARDIZEM) 30 MG tablet Take 30 mg by mouth. As directed      . diltiazem (DILACOR XR) 120 MG 24 hr capsule Take 120 mg by mouth daily. As directed      . furosemide (LASIX) 20 MG tablet Take 1 tablet (20 mg total) by mouth daily as needed.  30 tablet  11  . Garlic Oil 1000 MG CAPS Take 1 capsule by mouth daily.        Marland Kitchen glucosamine-chondroitin 500-400 MG tablet Take 1 tablet by mouth daily.        . lansoprazole (PREVACID) 30 MG capsule Take 30 mg by mouth daily.        . Multiple Vitamin (MULTIVITAMIN) tablet Take 1 tablet by mouth daily.        . saw palmetto 160 MG capsule Take 160 mg by mouth daily.        . simvastatin (ZOCOR) 20 MG tablet Take 20 mg by mouth at bedtime.        . vitamin E 1000 UNIT capsule Take 1,000 Units by mouth daily.

## 2011-10-07 ENCOUNTER — Telehealth: Payer: Self-pay | Admitting: Pulmonary Disease

## 2011-10-07 DIAGNOSIS — J8410 Pulmonary fibrosis, unspecified: Secondary | ICD-10-CM

## 2011-10-07 DIAGNOSIS — R0602 Shortness of breath: Secondary | ICD-10-CM

## 2011-10-07 NOTE — Telephone Encounter (Signed)
I spoke with pt and he states he is ready to have the ct chest, barium swallow, and pft set up. Pt states he will try to get these worked around his schedule. Pt is already scheduled for PFT 11/30 at 12:00 and is aware it's in our [place] office and is aware of address and phone #. Pt states he would like the other 2 test set up over at [facility] if possible. Does the CT need to be with/without contrast and would you like the barium swallow modified? Please advise Dr. Kendrick Fries, thanks

## 2011-10-07 NOTE — Telephone Encounter (Signed)
The CT can be non-contrast only and I would like a regular upper GI barium swallow, not a modified barium swallow.  Thanks.

## 2011-10-08 ENCOUNTER — Telehealth: Payer: Self-pay | Admitting: Pulmonary Disease

## 2011-10-08 DIAGNOSIS — R0602 Shortness of breath: Secondary | ICD-10-CM

## 2011-10-08 NOTE — Telephone Encounter (Signed)
I spoke with [facility] and was advised the 1st order for barium swallow was incorrect. I was advised it needs to be dg esophagus. I have corrected order. Please advise pcc, thanks

## 2011-10-08 NOTE — Telephone Encounter (Signed)
All orders have been placed. Will forward to [facility] so they can call pt first before setting pt's apt up. Please advise pcc, thanks

## 2011-10-13 ENCOUNTER — Ambulatory Visit (HOSPITAL_COMMUNITY)
Admission: RE | Admit: 2011-10-13 | Discharge: 2011-10-13 | Disposition: A | Discharge status: Home / Self Care | Payer: Medicare Other | Source: Ambulatory Visit | Attending: Pulmonary Disease | Admitting: Pulmonary Disease

## 2011-10-13 DIAGNOSIS — R0602 Shortness of breath: Secondary | ICD-10-CM | POA: Insufficient documentation

## 2011-10-13 DIAGNOSIS — K449 Diaphragmatic hernia without obstruction or gangrene: Secondary | ICD-10-CM | POA: Insufficient documentation

## 2011-10-13 DIAGNOSIS — K219 Gastro-esophageal reflux disease without esophagitis: Secondary | ICD-10-CM | POA: Insufficient documentation

## 2011-10-13 DIAGNOSIS — J8410 Pulmonary fibrosis, unspecified: Secondary | ICD-10-CM | POA: Insufficient documentation

## 2011-10-13 DIAGNOSIS — I2510 Atherosclerotic heart disease of native coronary artery without angina pectoris: Secondary | ICD-10-CM | POA: Insufficient documentation

## 2011-10-17 ENCOUNTER — Encounter (INDEPENDENT_AMBULATORY_CARE_PROVIDER_SITE_OTHER): Payer: Medicare Other | Admitting: Pulmonary Disease

## 2011-10-17 DIAGNOSIS — R0602 Shortness of breath: Secondary | ICD-10-CM

## 2011-10-17 LAB — PULMONARY FUNCTION TEST

## 2011-10-27 ENCOUNTER — Ambulatory Visit (INDEPENDENT_AMBULATORY_CARE_PROVIDER_SITE_OTHER): Payer: Medicare Other | Admitting: Pulmonary Disease

## 2011-10-27 ENCOUNTER — Encounter: Payer: Self-pay | Admitting: Pulmonary Disease

## 2011-10-27 DIAGNOSIS — K219 Gastro-esophageal reflux disease without esophagitis: Secondary | ICD-10-CM

## 2011-10-27 DIAGNOSIS — R05 Cough: Secondary | ICD-10-CM

## 2011-10-27 DIAGNOSIS — J8410 Pulmonary fibrosis, unspecified: Secondary | ICD-10-CM

## 2011-10-27 NOTE — Assessment & Plan Note (Signed)
We reviewed dietary and lifestyle modifications to help with his reflux today.  He should continue taking his PPI.  Definitive therapy may be a Nissen, as he has overt reflux on his barium swallow I'm not sure what further information a 24 hour pH probe would add.

## 2011-10-27 NOTE — Patient Instructions (Signed)
You have pulmonary fibrosis with normal lung function tests.   You should continue to walk on the treadmill or ride your bicycle every day for exercise. You can use mucinex 2-3 times per day as needed for cough and congestion. Use Ocean nasal spray 2-3 times per day for sinus congestion.  If this doesn't help, try using Lloyd Huger Med rinses 2-3 times per day with distilled water.  We will see you back in 3 months.

## 2011-10-27 NOTE — Progress Notes (Signed)
Subjective:    Patient ID: Walter Townsend, male    DOB: 01-Nov-1925, 77 y.o.   MRN: [id number]  HPIMr. Brenson returns for a follow up visit regarding his pulmonary fibrosis.  He has been exercising more on his treadmill and walked 1/2 mile yesterday.  He notes a cough which is occasionally productive of yellow thick sputum and is improved with mucinex.  He has sinus congestion throughout the day despite the use of what sounds like a nasal steroid (we don't have the drug on record).  Otherwise he is doing well.   Review of Systems     Objective:   Physical Exam Filed Vitals:   10/27/11 1314  BP: 158/80  Pulse: 77  Temp: 97.7 F (36.5 C)  TempSrc: Oral  Height: 5' 8.5" (1.74 m)  Weight: 169 lb (76.658 kg)  SpO2: 96%    Gen: no acute distress HEENT: NCAT, PERRL, EOMi, OP clear, neck supple without masses PULM: Insp crackles 1/2 way up bilaterally CV: RRR, no mgr, no JVD AB:  nontender, no distension, no hsm Ext: warm, no edema, no clubbing, no cyanosis Derm: no rash or skin breakdown Neuro: A&Ox4, CN II-XII intact, strength 5/5 in all 4 extremities   09/2011 CT Thorax: pulmonary fibrosis most in a UIP like pattern.  09/2011 Barium swallow: IMPRESSION:  1. Single episode of silent aspiration observed.  2. Mild presbyesophagus with hiatal hernia and significant  gastroesophageal reflux.  3. No evidence of esophagitis, stricture or ulceration.  Full PFT's: normal lung function and DLCO    Assessment & Plan:   Pulmonary fibrosis Mr. Fynn CT scan was described as a UIP like pattern because of the peripheral and basal interstitial thickening and scattered ground glass.   I believe that this is more likely due to aspiration given the findings from the barium swallow.  Regardless the cause, his lung function is excellent (volumes, DLCO, etc) so I think that it is more likely that his dyspnea is due to deconditioning.    At this point he does not need oxygen therapy, but  he should exercise more.  I think the best treatment plan at this point is to review reflux lifestyle changes and to continue taking his PPI at home.    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  He says that he has known that he has had fibrosis for years, so in all likelihood this will not progress rapidly.  We will monitor him every three months with office visits and PFT's every 6 months for now.    Cough I have suggested that he use guaifenesin and sinus rinses to help with his cough.   Reflux We reviewed dietary and lifestyle modifications to help with his reflux today.  He should continue taking his PPI.  Definitive therapy may be a Nissen, as he has overt reflux on his barium swallow I'm not sure what further information a 24 hour pH probe would add.    Updated Medication List Outpatient Encounter Prescriptions as of 10/27/2011  Medication Sig Dispense Refill  . aspirin 81 MG EC tablet Take 81 mg by mouth daily.        . Calcium Carbonate-Vitamin D (CALCIUM-VITAMIN D) 600-200 MG-UNIT CAPS Take 1 capsule by mouth daily.        Marland Kitchen diltiazem (CARDIZEM CD) 180 MG 24 hr capsule Take 180 mg by mouth. Daily as directed per Dr Mariah Milling      . diltiazem (  CARDIZEM) 30 MG tablet Take 30 mg by mouth. As directed      . diltiazem (DILACOR XR) 120 MG 24 hr capsule Take 120 mg by mouth daily. As directed      . Garlic Oil 1000 MG CAPS Take 1 capsule by mouth daily.        Marland Kitchen glucosamine-chondroitin 500-400 MG tablet Take 1 tablet by mouth daily.        . lansoprazole (PREVACID) 30 MG capsule Take 30 mg by mouth daily.        . Multiple Vitamin (MULTIVITAMIN) tablet Take 1 tablet by mouth daily.        . saw palmetto 160 MG capsule Take 160 mg by mouth daily.        . simvastatin (ZOCOR) 20 MG tablet Take 20 mg by mouth at bedtime.        . vitamin E 1000 UNIT capsule Take 1,000 Units by mouth daily.        Marland Kitchen DISCONTD: furosemide (LASIX) 20 MG  tablet Take 1 tablet (20 mg total) by mouth daily as needed.  30 tablet  11

## 2011-10-27 NOTE — Assessment & Plan Note (Signed)
I have suggested that he use guaifenesin and sinus rinses to help with his cough.

## 2011-10-27 NOTE — Assessment & Plan Note (Signed)
Mr. Broghan CT scan was described as a UIP like pattern because of the peripheral and basal interstitial thickening and scattered ground glass.   I believe that this is more likely due to aspiration given the findings from the barium swallow.  Regardless the cause, his lung function is excellent (volumes, DLCO, etc) so I think that it is more likely that his dyspnea is due to deconditioning.    At this point he does not need oxygen therapy, but he should exercise more.  I think the best treatment plan at this point is to review reflux lifestyle changes and to continue taking his PPI at home.    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  He says that he has known that he has had fibrosis for years, so in all likelihood this will not progress rapidly.  We will monitor him every three months with office visits and PFT's every 6 months for now.

## 2011-11-04 ENCOUNTER — Encounter: Payer: Self-pay | Admitting: Pulmonary Disease

## 2011-12-24 ENCOUNTER — Ambulatory Visit (INDEPENDENT_AMBULATORY_CARE_PROVIDER_SITE_OTHER): Payer: Medicare Other | Admitting: Cardiovascular Disease

## 2011-12-24 ENCOUNTER — Encounter: Payer: Self-pay | Admitting: Cardiovascular Disease

## 2011-12-24 VITALS — BP 143/84 | HR 61 | Ht 69.5 in | Wt 169.0 lb

## 2011-12-24 DIAGNOSIS — I471 Supraventricular tachycardia, unspecified: Secondary | ICD-10-CM

## 2011-12-24 DIAGNOSIS — R0602 Shortness of breath: Secondary | ICD-10-CM

## 2011-12-24 DIAGNOSIS — E785 Hyperlipidemia, unspecified: Secondary | ICD-10-CM

## 2011-12-24 DIAGNOSIS — R9431 Abnormal electrocardiogram [ECG] [EKG]: Secondary | ICD-10-CM

## 2011-12-24 DIAGNOSIS — J8410 Pulmonary fibrosis, unspecified: Secondary | ICD-10-CM

## 2011-12-24 MED ORDER — LANSOPRAZOLE 30 MG PO CPDR: 30.0000 mg | DELAYED_RELEASE_CAPSULE | Freq: Every day | ORAL | Status: DC

## 2011-12-24 MED ORDER — DILTIAZEM HCL 30 MG PO TABS: 30.0000 mg | ORAL_TABLET | Freq: Every day | ORAL | Status: DC | PRN

## 2011-12-24 MED ORDER — ATORVASTATIN CALCIUM 20 MG PO TABS: 20.0000 mg | ORAL_TABLET | Freq: Every day | ORAL | Status: DC

## 2011-12-24 NOTE — Assessment & Plan Note (Signed)
No significant symptoms of palpitations. He is concerned about his low blood pressure with exercise. We have suggested he hold the diltiazem 30 mg and take the 120 mg pill after exercise.

## 2011-12-24 NOTE — Progress Notes (Signed)
Patient ID: Walter Townsend, male    DOB: [birth year], 78 y.o.   MRN: [id number]  HPI Comments: 78 yo gentleman with a h/o of tachypalpitations, well controlled on diltiazem, hyperlipidemia, GERD, esophageal stricture, worsening SOB, dx with pulmonary fibrosis,  Presenting for routine followup.  He reports that his shortness of breath has been very limiting. He is unable to treadmill very far and has to stop secondary to his breathing. His energy is poor. He feels that something is wrong. He is concerned that his blood pressure is low when he is exercising. We previously decreased the diltiazem from 180 mg to 120 mg daily and he does take a 30 mg tablet later in the day for breakthrough palpitations.  He denies any significant change in his edema.  He only smoked for 4 years as a teenager but did have significant secondhand smoke.   Today he feels well. No further episodes, though his heart rate is in the 50s.    EKG: NSR with rate of 72 bpm, RBBB    Outpatient Encounter Prescriptions as of 12/24/2011  Medication Sig Dispense Refill  . aspirin 81 MG EC tablet Take 81 mg by mouth daily.        . Calcium Carbonate-Vitamin D (CALCIUM-VITAMIN D) 600-200 MG-UNIT CAPS Take 1 capsule by mouth daily.        Marland Kitchen diltiazem (DILACOR XR) 120 MG 24 hr capsule Take 120 mg by mouth daily. As directed      . Garlic Oil 1000 MG CAPS Take 1 capsule by mouth daily.        Marland Kitchen glucosamine-chondroitin 500-400 MG tablet Take 1 tablet by mouth daily.        . lansoprazole (PREVACID) 30 MG capsule Take 1 capsule (30 mg total) by mouth daily.  90 capsule  4  . Multiple Vitamin (MULTIVITAMIN) tablet Take 1 tablet by mouth daily.        . saw palmetto 160 MG capsule Take 160 mg by mouth daily.        . vitamin E 400 UNIT capsule Take 400 Units by mouth daily.      Marland Kitchen  diltiazem (CARDIZEM) 30 MG tablet Take 30 mg by mouth. As directed      . lansoprazole (PREVACID) 30 MG capsule Take 30 mg by mouth daily.        Marland Kitchen  atorvastatin (LIPITOR) 20 MG tablet Take 1 tablet (20 mg total) by mouth daily.  90 tablet  4    Review of Systems  Constitutional: Negative.   HENT: Negative.   Eyes: Negative.   Respiratory: Positive for shortness of breath.   Cardiovascular: Negative.   Gastrointestinal: Negative.   Musculoskeletal: Negative.   Skin: Negative.   Neurological: Negative.   Hematological: Negative.   Psychiatric/Behavioral: Negative.   All other systems reviewed and are negative.    BP 143/84  Pulse 61  Ht 5' 9.5" (1.765 m)  Wt 169 lb (76.658 kg)  BMI 24.60 kg/m2  Physical Exam  Nursing note and vitals reviewed. Constitutional: He is oriented to person, place, and time. He appears well-developed and well-nourished.  HENT:  Head: Normocephalic.  Nose: Nose normal.  Mouth/Throat: Oropharynx is clear and moist.  Eyes: Conjunctivae are normal. Pupils are equal, round, and reactive to light.  Neck: Normal range of motion. Neck supple. No JVD present.  Cardiovascular: Normal rate, regular rhythm, S1 normal, S2 normal, normal heart sounds and intact distal pulses.  Exam reveals no gallop and no friction  rub.   No murmur heard. Pulmonary/Chest: Effort normal. No respiratory distress. He has no wheezes. He has rales. He exhibits no tenderness.       Most notably in the right base  Abdominal: Soft. Bowel sounds are normal. He exhibits no distension. There is no tenderness.  Musculoskeletal: Normal range of motion. He exhibits no edema and no tenderness.  Lymphadenopathy:    He has no cervical adenopathy.  Neurological: He is alert and oriented to person, place, and time. Coordination normal.  Skin: Skin is warm and dry. No rash noted. No erythema.  Psychiatric: He has a normal mood and affect. His behavior is normal. Judgment and thought content normal.           Assessment and Plan

## 2011-12-24 NOTE — Assessment & Plan Note (Signed)
We have suggested he continue his Lipitor 20 mg daily.

## 2011-12-24 NOTE — Patient Instructions (Addendum)
You are doing well. Hold the diltiazem 30 mg and stay on diltiazem 120 mg a day (take after your exercise) Start the lipitor 20 mg daily  We have ordered a stress test to examine your shortness of breath The day before the stress test and the day of the test, do not take diltiazem  Please call us if you have new issues that need to be addressed before your next appt.  Your physician wants you to follow-up in: 6 months.  You will receive a reminder letter in the mail two months in advance. If you don't receive a letter, please call our office to schedule the follow-up appointment.  Your physician has requested that you have a lexiscan myoview. For further information please visit [email or web address] Please follow instruction sheet, as given.

## 2011-12-24 NOTE — Assessment & Plan Note (Signed)
We have ordered a stress test for his progressive shortness of breath. He is very symptomatic. Underlying abnormal EKG, history of hyperlipidemia and hypertension. Remote smoking history.

## 2011-12-24 NOTE — Assessment & Plan Note (Signed)
Stress test has been ordered to rule out ischemia as a cause of his shortness of breath. Yesterday possible his shortness of breath could be secondary to his pulmonary fibrosis though he does not report having hypoxia with exertion.

## 2011-12-26 ENCOUNTER — Telehealth: Payer: Self-pay

## 2011-12-26 NOTE — Telephone Encounter (Signed)
No precert required for Lexiscan.

## 2011-12-26 NOTE — Telephone Encounter (Signed)
Message copied by Festus Aloe on Fri Dec 26, 2011  8:57 AM ------      Message from: Carmelina Paddock      Created: Fri Dec 26, 2011  8:29 AM      Regarding: lexiscan @ [facility] 12/30/11       No precert required per this pt/s [facility] policy                  Thanks,            [employer]

## 2011-12-30 ENCOUNTER — Ambulatory Visit: Payer: Self-pay | Admitting: Cardiovascular Disease

## 2011-12-30 DIAGNOSIS — R079 Chest pain, unspecified: Secondary | ICD-10-CM

## 2012-01-02 ENCOUNTER — Encounter: Payer: Self-pay | Admitting: Cardiovascular Disease

## 2012-04-08 ENCOUNTER — Other Ambulatory Visit: Payer: Self-pay | Admitting: Cardiovascular Disease

## 2012-04-08 MED ORDER — DILTIAZEM HCL ER COATED BEADS 120 MG PO CP24: 120.0000 mg | ORAL_CAPSULE | Freq: Every day | ORAL | Status: DC

## 2012-04-08 NOTE — Telephone Encounter (Signed)
Refilled Diltiazem. 

## 2012-06-23 ENCOUNTER — Encounter: Payer: Self-pay | Admitting: Cardiovascular Disease

## 2012-06-23 ENCOUNTER — Ambulatory Visit (INDEPENDENT_AMBULATORY_CARE_PROVIDER_SITE_OTHER): Payer: Medicare Other | Admitting: Cardiovascular Disease

## 2012-06-23 VITALS — BP 130/80 | HR 63 | Ht 69.5 in | Wt 164.5 lb

## 2012-06-23 DIAGNOSIS — E785 Hyperlipidemia, unspecified: Secondary | ICD-10-CM

## 2012-06-23 DIAGNOSIS — I10 Essential (primary) hypertension: Secondary | ICD-10-CM

## 2012-06-23 DIAGNOSIS — I471 Supraventricular tachycardia: Secondary | ICD-10-CM

## 2012-06-23 DIAGNOSIS — J8410 Pulmonary fibrosis, unspecified: Secondary | ICD-10-CM

## 2012-06-23 MED ORDER — ATORVASTATIN CALCIUM 20 MG PO TABS: 20.0000 mg | ORAL_TABLET | Freq: Every day | ORAL | Status: DC

## 2012-06-23 MED ORDER — LANSOPRAZOLE 30 MG PO CPDR: 30.0000 mg | DELAYED_RELEASE_CAPSULE | Freq: Every day | ORAL | Status: DC

## 2012-06-23 MED ORDER — DILTIAZEM HCL ER COATED BEADS 120 MG PO CP24: 120.0000 mg | ORAL_CAPSULE | Freq: Every day | ORAL | Status: DC

## 2012-06-23 NOTE — Assessment & Plan Note (Signed)
Blood pressure is well controlled on today's visit. No changes made to the medications. 

## 2012-06-23 NOTE — Assessment & Plan Note (Signed)
He denies any tachycardia or palpitations on his current medication regimen. No changes made.

## 2012-06-23 NOTE — Assessment & Plan Note (Signed)
Shortness of breath is stable, in fact he feels it is improved with exercise. He is doing his own pulmonary rehabilitation through his activity

## 2012-06-23 NOTE — Assessment & Plan Note (Signed)
We have suggested he stay on Lipitor. We'll try to obtain his most recent lipid panel for our records.

## 2012-06-23 NOTE — Progress Notes (Signed)
Patient ID: Walter Townsend, male    DOB: November 29, 1924, 78 y.o.   MRN: [id number]  HPI Comments: 78 yo gentleman with a h/o of tachypalpitations, well controlled on diltiazem, hyperlipidemia, GERD, esophageal stricture, worsening SOB, dx with pulmonary fibrosis,  Presenting for routine followup.  Since his last clinic visit, he has been doing more exercise, walking and biking. He feels well and his breathing has improved. He has not had followup with pulmonary as he does feel better. He does notice bradycardia at nighttime after he takes his medications but he is not very symptomatic. Otherwise he feels well with no complaints.   He only smoked for 4 years as a teenager but did have significant secondhand smoke.   EKG: NSR with rate of 63 bpm, RBBB, rare PVC    Outpatient Encounter Prescriptions as of 06/23/2012  Medication Sig Dispense Refill  . aspirin 81 MG EC tablet Take 81 mg by mouth daily.        Marland Kitchen atorvastatin (LIPITOR) 20 MG tablet Take 1 tablet (20 mg total) by mouth daily.  90 tablet  3  . Calcium Carbonate-Vitamin D (CALCIUM-VITAMIN D) 600-200 MG-UNIT CAPS Take 1 capsule by mouth daily.        . Garlic Oil 1000 MG CAPS Take 1 capsule by mouth daily.        Marland Kitchen glucosamine-chondroitin 500-400 MG tablet Take 1 tablet by mouth daily.        . lansoprazole (PREVACID) 30 MG capsule Take 1 capsule (30 mg total) by mouth daily.  90 capsule  4  . Multiple Vitamin (MULTIVITAMIN) tablet Take 1 tablet by mouth daily.        . saw palmetto 160 MG capsule Take 160 mg by mouth daily.        . vitamin E 400 UNIT capsule Take 400 Units by mouth daily as needed.       . diltiazem (CARDIZEM CD) 120 MG 24 hr capsule Take 1 capsule (120 mg total) by mouth daily.  90 capsule  3   Review of Systems  Constitutional: Negative.   HENT: Negative.   Eyes: Negative.   Respiratory: Positive for shortness of breath.   Cardiovascular: Negative.   Gastrointestinal: Negative.   Musculoskeletal: Negative.     Skin: Negative.   Neurological: Negative.   Hematological: Negative.   Psychiatric/Behavioral: Negative.   All other systems reviewed and are negative.    BP 130/80  Pulse 63  Ht 5' 9.5" (1.765 m)  Wt 164 lb 8 oz (74.617 kg)  BMI 23.94 kg/m2  Physical Exam  Nursing note and vitals reviewed. Constitutional: He is oriented to person, place, and time. He appears well-developed and well-nourished.  HENT:  Head: Normocephalic.  Nose: Nose normal.  Mouth/Throat: Oropharynx is clear and moist.  Eyes: Conjunctivae are normal. Pupils are equal, round, and reactive to light.  Neck: Normal range of motion. Neck supple. No JVD present.  Cardiovascular: Normal rate, regular rhythm, S1 normal, S2 normal, normal heart sounds and intact distal pulses.  Exam reveals no gallop and no friction rub.   No murmur heard. Pulmonary/Chest: Effort normal. No respiratory distress. He has no wheezes. He has rales. He exhibits no tenderness.       Most notably in the right base  Abdominal: Soft. Bowel sounds are normal. He exhibits no distension. There is no tenderness.  Musculoskeletal: Normal range of motion. He exhibits no edema and no tenderness.  Lymphadenopathy:    He has no cervical adenopathy.  Neurological: He is alert and oriented to person, place, and time. Coordination normal.  Skin: Skin is warm and dry. No rash noted. No erythema.  Psychiatric: He has a normal mood and affect. His behavior is normal. Judgment and thought content normal.           Assessment and Plan

## 2012-06-23 NOTE — Patient Instructions (Signed)
You are doing well. No medication changes were made.  Please call us if you have new issues that need to be addressed before your next appt.  Your physician wants you to follow-up in: 6 months.  You will receive a reminder letter in the mail two months in advance. If you don't receive a letter, please call our office to schedule the follow-up appointment.   

## 2012-10-25 ENCOUNTER — Ambulatory Visit: Payer: Self-pay

## 2013-01-05 ENCOUNTER — Other Ambulatory Visit: Payer: Self-pay

## 2013-01-05 MED ORDER — DILTIAZEM HCL ER COATED BEADS 120 MG PO CP24: 120.0000 mg | ORAL_CAPSULE | Freq: Every day | ORAL | Status: DC

## 2013-01-05 MED ORDER — LANSOPRAZOLE 30 MG PO CPDR: 30.0000 mg | DELAYED_RELEASE_CAPSULE | Freq: Every day | ORAL | Status: DC

## 2013-08-01 ENCOUNTER — Other Ambulatory Visit: Payer: Self-pay | Admitting: Cardiovascular Disease

## 2013-08-25 ENCOUNTER — Telehealth: Payer: Self-pay

## 2013-08-25 ENCOUNTER — Ambulatory Visit (INDEPENDENT_AMBULATORY_CARE_PROVIDER_SITE_OTHER): Payer: Medicare Other | Admitting: Cardiovascular Disease

## 2013-08-25 ENCOUNTER — Encounter: Payer: Self-pay | Admitting: Cardiovascular Disease

## 2013-08-25 ENCOUNTER — Encounter (HOSPITAL_COMMUNITY)
Admission: AD | Disposition: A | Discharge status: Home / Self Care | Payer: Self-pay | Source: Ambulatory Visit | Attending: Cardiovascular Disease

## 2013-08-25 ENCOUNTER — Encounter: Payer: Self-pay | Admitting: *Deleted

## 2013-08-25 ENCOUNTER — Inpatient Hospital Stay (HOSPITAL_COMMUNITY)
Admission: AD | Admit: 2013-08-25 | Discharge: 2013-08-27 | DRG: 244 | Disposition: A | Discharge status: Home / Self Care | Payer: Medicare Other | Source: Ambulatory Visit | Attending: Cardiovascular Disease | Admitting: Cardiovascular Disease

## 2013-08-25 ENCOUNTER — Encounter (HOSPITAL_COMMUNITY): Payer: Self-pay | Admitting: *Deleted

## 2013-08-25 VITALS — BP 120/58 | HR 32 | Ht 68.0 in | Wt 167.0 lb

## 2013-08-25 DIAGNOSIS — N400 Enlarged prostate without lower urinary tract symptoms: Secondary | ICD-10-CM | POA: Diagnosis present

## 2013-08-25 DIAGNOSIS — I471 Supraventricular tachycardia: Secondary | ICD-10-CM

## 2013-08-25 DIAGNOSIS — Z7982 Long term (current) use of aspirin: Secondary | ICD-10-CM

## 2013-08-25 DIAGNOSIS — I441 Atrioventricular block, second degree: Secondary | ICD-10-CM | POA: Diagnosis present

## 2013-08-25 DIAGNOSIS — Z79899 Other long term (current) drug therapy: Secondary | ICD-10-CM

## 2013-08-25 DIAGNOSIS — Z87891 Personal history of nicotine dependence: Secondary | ICD-10-CM

## 2013-08-25 DIAGNOSIS — R42 Dizziness and giddiness: Secondary | ICD-10-CM

## 2013-08-25 DIAGNOSIS — R001 Bradycardia, unspecified: Secondary | ICD-10-CM

## 2013-08-25 DIAGNOSIS — I10 Essential (primary) hypertension: Secondary | ICD-10-CM | POA: Diagnosis present

## 2013-08-25 DIAGNOSIS — E785 Hyperlipidemia, unspecified: Secondary | ICD-10-CM | POA: Diagnosis present

## 2013-08-25 DIAGNOSIS — I4949 Other premature depolarization: Secondary | ICD-10-CM | POA: Diagnosis present

## 2013-08-25 DIAGNOSIS — I442 Atrioventricular block, complete: Secondary | ICD-10-CM

## 2013-08-25 DIAGNOSIS — J8410 Pulmonary fibrosis, unspecified: Secondary | ICD-10-CM | POA: Diagnosis present

## 2013-08-25 DIAGNOSIS — I495 Sick sinus syndrome: Secondary | ICD-10-CM | POA: Diagnosis present

## 2013-08-25 DIAGNOSIS — Z8249 Family history of ischemic heart disease and other diseases of the circulatory system: Secondary | ICD-10-CM

## 2013-08-25 DIAGNOSIS — I498 Other specified cardiac arrhythmias: Secondary | ICD-10-CM

## 2013-08-25 HISTORY — PX: TEMPORARY PACEMAKER INSERTION: SHX5471

## 2013-08-25 HISTORY — DX: Benign prostatic hyperplasia without lower urinary tract symptoms: N40.0

## 2013-08-25 LAB — MRSA PCR SCREENING: MRSA by PCR: NEGATIVE

## 2013-08-25 LAB — CBC WITH DIFFERENTIAL/PLATELET
Basophils Absolute: 0.1 10*3/uL (ref 0.0–0.1)
Basophils Relative: 1 % (ref 0–1)
Eosinophils Absolute: 0.2 10*3/uL (ref 0.0–0.7)
Eosinophils Relative: 3 % (ref 0–5)
HCT: 42.5 % (ref 39.0–52.0)
Lymphocytes Relative: 40 % (ref 12–46)
Lymphs Abs: 3.6 10*3/uL (ref 0.7–4.0)
MCH: 32.1 pg (ref 26.0–34.0)
MCHC: 35.3 g/dL (ref 30.0–36.0)
Monocytes Absolute: 1 10*3/uL (ref 0.1–1.0)
Monocytes Relative: 11 % (ref 3–12)
Neutro Abs: 4.2 10*3/uL (ref 1.7–7.7)
Neutrophils Relative %: 46 % (ref 43–77)
RDW: 13.6 % (ref 11.5–15.5)
WBC: 9.2 10*3/uL (ref 4.0–10.5)

## 2013-08-25 LAB — COMPREHENSIVE METABOLIC PANEL
AST: 20 U/L (ref 0–37)
Albumin: 3.8 g/dL (ref 3.5–5.2)
BUN: 14 mg/dL (ref 6–23)
CO2: 27 mEq/L (ref 19–32)
Calcium: 9.7 mg/dL (ref 8.4–10.5)
Creatinine, Ser: 0.91 mg/dL (ref 0.50–1.35)
Total Protein: 7.8 g/dL (ref 6.0–8.3)

## 2013-08-25 LAB — MAGNESIUM: Magnesium: 2 mg/dL (ref 1.5–2.5)

## 2013-08-25 SURGERY — TEMPORARY PACEMAKER INSERTION
Anesthesia: LOCAL

## 2013-08-25 MED ORDER — FENTANYL CITRATE 0.05 MG/ML IJ SOLN
INTRAMUSCULAR | Status: AC
  Filled 2013-08-25: qty 2

## 2013-08-25 MED ORDER — HEPARIN (PORCINE) IN NACL 2-0.9 UNIT/ML-% IJ SOLN
INTRAMUSCULAR | Status: AC
  Filled 2013-08-25: qty 1000

## 2013-08-25 MED ORDER — HYDRALAZINE HCL 20 MG/ML IJ SOLN
10.0000 mg | Freq: Four times a day (QID) | INTRAMUSCULAR | Status: DC | PRN
  Administered 2013-08-25 – 2013-08-26 (×2): 10 mg via INTRAVENOUS
  Filled 2013-08-25 (×2): qty 1

## 2013-08-25 MED ORDER — LIDOCAINE HCL (PF) 1 % IJ SOLN
INTRAMUSCULAR | Status: AC
  Filled 2013-08-25: qty 30

## 2013-08-25 MED ORDER — DIPHENHYDRAMINE HCL 25 MG PO CAPS: 25.0000 mg | ORAL_CAPSULE | Freq: Every evening | ORAL | Status: DC | PRN

## 2013-08-25 MED ORDER — ACETAMINOPHEN 325 MG PO TABS
650.0000 mg | ORAL_TABLET | ORAL | Status: DC | PRN
  Administered 2013-08-26: 650 mg via ORAL
  Filled 2013-08-25: qty 2

## 2013-08-25 MED ORDER — ZOLPIDEM TARTRATE 5 MG PO TABS
5.0000 mg | ORAL_TABLET | Freq: Every evening | ORAL | Status: DC | PRN
Start: 2013-08-25 — End: 2013-08-27
  Administered 2013-08-25 – 2013-08-26 (×2): 5 mg via ORAL
  Filled 2013-08-25 (×2): qty 1

## 2013-08-25 MED ORDER — NITROGLYCERIN 0.4 MG SL SUBL: 0.4000 mg | SUBLINGUAL_TABLET | SUBLINGUAL | Status: DC | PRN

## 2013-08-25 MED ORDER — ONDANSETRON HCL 4 MG/2ML IJ SOLN
4.0000 mg | Freq: Four times a day (QID) | INTRAMUSCULAR | Status: DC | PRN
  Administered 2013-08-26: 4 mg via INTRAVENOUS
  Filled 2013-08-25: qty 2

## 2013-08-25 MED ORDER — MIDAZOLAM HCL 2 MG/2ML IJ SOLN
INTRAMUSCULAR | Status: AC
  Filled 2013-08-25: qty 2

## 2013-08-25 MED ORDER — SODIUM CHLORIDE 0.9 % IV SOLN
INTRAVENOUS | Status: DC
  Administered 2013-08-25: 20 mL/h via INTRAVENOUS

## 2013-08-25 NOTE — Telephone Encounter (Signed)
I spoke with the patient. He states that yesterday he felt dizzy with a BP of 90/60 and HR- 50. This morning he has readings of a BP of 155/80 and HR- 40. He is not diabetic, but checked his blood sugar- reading was 115. The patient states he is not feeling well at all and wants to be seen. He does not want to go to the ER for evaluation. He reports he usually takes diltiazem 120 mg in the afternoons. He states he took an extra diltiazem 30 mg last night because he still wasn't feeling well. Offered appointment at 10:30 am this morning with Dr. Mariah Milling. I advised if he is symptomatic, that he should not drive himself.

## 2013-08-25 NOTE — Progress Notes (Signed)
Patient ID: Walter Townsend, male    DOB: 05/25/1925, 79 y.o.   MRN: [id number]  HPI Comments: 79 yo gentleman with a h/o of tachypalpitations, previously well controlled on diltiazem, hyperlipidemia, GERD, esophageal stricture, worsening SOB, dx with pulmonary fibrosis,  Presenting for followup after developing dizzy episodes yesterday.  He is a poor historian today and is uncertain when he typically takes his diltiazem. After much discussion, it sounds as if he takes the diltiazem in the afternoon. He woke up yesterday and felt well and through the day, did not feel well. Symptoms seem to be worse after taking his diltiazem yesterday afternoon. As he did not feel well, he took an extra diltiazem 30 mg which he typically takes for tachycardia. This seemed to make his symptoms worse. He slept and as symptoms persisted, presented to the office today for evaluation.   Getting into the office, his gait has been poor, balance unusual, he is confused, dizzy. He certainly does not feel himself. He is able to walk to the bathroom with his walker but appeared pale, somewhat unsteady.  Typically he is very active with no complaints   He only smoked for 4 years as a teenager but did have significant secondhand smoke.   EKG today shows normal sinus rhythm with rate 75, complete heart block with escape rhythm at 32 beats per minute     No facility-administered encounter medications on file as of 08/25/2013.   Outpatient Encounter Prescriptions as of 08/25/2013  Medication Sig Dispense Refill  . aspirin 81 MG EC tablet Take 81 mg by mouth at bedtime.       . Calcium Carbonate-Vitamin D (CALCIUM-VITAMIN D) 600-200 MG-UNIT CAPS Take 1 capsule by mouth daily.        . cyanocobalamin 100 MCG tablet Take 100 mcg by mouth daily.      . Garlic Oil 1000 MG CAPS Take 1 capsule by mouth daily.        Marland Kitchen glucosamine-chondroitin 500-400 MG tablet Take 1 tablet by mouth daily.        . Multiple Vitamin (MULTIVITAMIN)  tablet Take 1 tablet by mouth daily.        . saw palmetto 160 MG capsule Take 160 mg by mouth daily.        Marland Kitchen diltiazem (CARDIZEM CD) 120 MG 24 hr capsule Take 1 capsule (120 mg total) by mouth daily.  90 capsule  3     Review of Systems  Constitutional: Positive for fatigue.  HENT: Negative.   Eyes: Negative.   Respiratory: Negative.   Cardiovascular: Negative.   Gastrointestinal: Negative.   Endocrine: Negative.   Musculoskeletal: Negative.   Skin: Negative.   Allergic/Immunologic: Negative.   Neurological: Positive for dizziness and weakness.  Hematological: Negative.   Psychiatric/Behavioral: Negative.   All other systems reviewed and are negative.    BP 120/58  Pulse 32  Ht 5\' 8"  (1.727 m)  Wt 167 lb (75.751 kg)  BMI 25.4 kg/m2  Physical Exam  Nursing note and vitals reviewed. Constitutional: He is oriented to person, place, and time. He appears well-developed and well-nourished.  HENT:  Head: Normocephalic.  Nose: Nose normal.  Mouth/Throat: Oropharynx is clear and moist.  Eyes: Conjunctivae are normal. Pupils are equal, round, and reactive to light.  Neck: Normal range of motion. Neck supple. No JVD present.  Cardiovascular: Regular rhythm, S1 normal, S2 normal, normal heart sounds and intact distal pulses.  Bradycardia present.  Exam reveals no gallop and no friction  rub.   No murmur heard. Pulmonary/Chest: Effort normal and breath sounds normal. No respiratory distress. He has no wheezes. He has no rales. He exhibits no tenderness.  Abdominal: Soft. Bowel sounds are normal. He exhibits no distension. There is no tenderness.  Musculoskeletal: Normal range of motion. He exhibits no edema and no tenderness.  Lymphadenopathy:    He has no cervical adenopathy.  Neurological: He is alert and oriented to person, place, and time. Coordination normal.  Skin: Skin is warm and dry. No rash noted. No erythema.  Psychiatric: He has a normal mood and affect. His behavior  is normal. Judgment and thought content normal.      Assessment and Plan

## 2013-08-25 NOTE — CV Procedure (Addendum)
Temporary Pacemaker Procedure Note   Indication: Transient high grade AV block with pauses up to 5 seconds  Procedure: Temporary transvenous pacer via the right femoral vein  Description: After informed consent, a 6 French sheath was placed in the right femoral vein. Following 1% Xylocaine local anesthesia. A 5 French balloon tipped bipolar temporary pacemaker was advanced to the right ventricular apex. Using fluoroscopic guidance. A threshold was established to be less than 0.6 MA. Final pacer settings were rate of 60 with output of 4 MA. No complications occurred.

## 2013-08-25 NOTE — H&P (Signed)
The patient is admitted to the hospital with presyncope. Monitoring has revealed pauses up to 5 seconds. The EP team. Have asked that we place a temporary wire to prevent asystole. I discussed the approach with the patient and his grandson. He is willing to proceed. The plan is to see if pauses resolve after he has had an opportunity for medication washout. The procedure and its risks were discussed in detail. Bleeding, infection, death, blood clotting, among others were discussed as possible. Complications.

## 2013-08-25 NOTE — Patient Instructions (Addendum)
You are in complete heart block Please stop the diltiazem We strongly recommend you proceed to [place] for admission and close monitoring of her heart rate, possible pacemaker placement.  Please call us if you have new issues that need to be addressed before your next appt.  Your physician wants you to follow-up in: 6 months.  You will receive a reminder letter in the mail two months in advance. If you don't receive a letter, please call our office to schedule the follow-up appointment.

## 2013-08-25 NOTE — Telephone Encounter (Signed)
Patient got very dizzy yesterday with BP 90/60 with heart rate 46. Blood pressure today 155/80 and heart rate 39 with dizziness and very fatigue. Please call with what to do.

## 2013-08-25 NOTE — Assessment & Plan Note (Signed)
Recommended he hold his diltiazem for now. Given history of tachypalpitations, if he does have underlying AV nodal disease, may benefit from pacer given prior problems with tachycardia.

## 2013-08-25 NOTE — Assessment & Plan Note (Signed)
Encouraged him to stay on his Lipitor 

## 2013-08-25 NOTE — H&P (Addendum)
ELECTROPHYSIOLOGY ADMISSION HISTORY & PHYSICAL  Patient ID: Walter Townsend MRN: [id number], DOB/AGE: [birth year]   Date of Admission: 08/25/2013  Primary Physician: Elmo Putt, MD Primary Cardiologist: Mariah Milling, MD Reason for Admission: Complete heart block  History of Present Illness Walter Townsend is a pleasant 79 year old man with HTN, dyslipidemia, pulmonary fibrosis and symptomatic PVCs on diltiazem who is being admitted with complete heart block. He reports taking diltiazem "for years." He takes diltiazem CD 120 mg daily and takes short-acting 30 mg as needed. Yesterday he was feeling increased palpitations and dizziness so he took a short acting dose in addition to the long-acting maintenance dose at 6:00 pm. He states this made him feel worse. He describes intermittent near syncope anytime he tried to walk or exert. He was also experiencing dizziness while lying down. He denies frank syncope. He denies CP or SOB. He denies any history of CAD/MI/valvular heart disease or HF.   Past Medical History Past Medical History  Diagnosis Date  . Rapid palpitations   . Hyperlipidemia   . Hypertension   . Pulmonary fibrosis   . Prostate enlargement     Past Surgical History Past Surgical History  Procedure Laterality Date  . Foot surgery    . Back surgery    . Hernia repair      Allergies/Intolerances No Known Allergies  Home Medications Medications Prior to Admission  Medication Sig Dispense Refill  . aspirin 81 MG EC tablet Take 81 mg by mouth at bedtime.       Marland Kitchen atorvastatin (LIPITOR) 20 MG tablet Take 20 mg by mouth daily.      . Calcium Carbonate-Vitamin D (CALCIUM-VITAMIN D) 600-200 MG-UNIT CAPS Take 1 capsule by mouth daily.        . cyanocobalamin 100 MCG tablet Take 100 mcg by mouth daily.      Marland Kitchen diltiazem (CARDIZEM CD) 120 MG 24 hr capsule Take 120 mg by mouth daily.      Marland Kitchen docusate sodium (COLACE) 100 MG capsule Take 100 mg by mouth at bedtime as needed for  constipation.      . Garlic Oil 1000 MG CAPS Take 1 capsule by mouth daily.        Marland Kitchen glucosamine-chondroitin 500-400 MG tablet Take 1 tablet by mouth daily.        . lansoprazole (PREVACID) 30 MG capsule Take 30 mg by mouth daily.      . Multiple Vitamin (MULTIVITAMIN) tablet Take 1 tablet by mouth daily.        . saw palmetto 160 MG capsule Take 160 mg by mouth daily.          Family History Positive for CAD   Social History Social History  . Marital Status: Married   Social History Main Topics  . Smoking status: Former Smoker -- 0.30 packs/day for 3 years    Types: Cigarettes    Quit date: 11/17/1948  . Smokeless tobacco: Never Used  . Alcohol Use: No  . 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  He as never used pesticides.  His house is not moldy, he has no pets.      Review of Systems General:  No chills, fever, night sweats or weight changes.  Cardiovascular: No chest pain, dyspnea on exertion, edema, orthopnea, paroxysmal nocturnal dyspnea. Dermatological: No rash, lesions or masses. Respiratory: No cough, dyspnea. Urologic: No hematuria, dysuria. Abdominal: No nausea, vomiting, diarrhea, bright red blood per rectum, melena, or hematemesis. Neurologic: No visual changes All other systems reviewed and are otherwise negative except as noted above.  Physical Exam Vitals: Blood pressure 150/69, pulse 85, temperature 97.5 F (36.4 C), temperature source Oral, resp. rate 16, height 5\' 8"  (1.727 m), weight 160 lb 7.9 oz (72.8 kg), SpO2 99.00%.  General: Well developed, well appearing 79 y.o. male in no acute distress. HEENT: Normocephalic, atraumatic. EOMs intact. Sclera nonicteric. Oropharynx clear.  Neck:  Supple without bruits. No JVD. Lungs: Respirations regular and unlabored, CTA bilaterally. No wheezes, rales or rhonchi. Heart: Bradycardic. S1, S2 present. No murmurs, rub, S3 or S4. Abdomen: Soft, non-tender, non-distended. BS present x 4 quadrants. No hepatosplenomegaly.  Extremities: No clubbing, cyanosis or edema. DP/PT/Radials 2+ and equal bilaterally. Psych: Normal affect. Neuro: Alert and oriented X 3. Moves all extremities spontaneously. Musculoskeletal: No kyphosis. Skin: Intact. Warm and dry. No rashes or petechiae in exposed areas.   Labs Admission labs pending  Radiology/Studies No results found.  Echocardiogram  No echo in EPIC  12-lead ECG shows SR with intermittent CHB and RBBB Telemetry shows SR with frequent CHB and 2:1 AV block with pauses ~5 seconds  Assessment and Plan 1. CHB alternating with 2:1 AV block and pauses ~5 seconds 2. Symptomatic PVCs 3. Pulmonary fibrosis 4. HTN  Walter Townsend presents with high grade AV block, symptomatic with dizziness and near syncope. He is currently taking a CCB. He is having frequent episodes with pauses in quick succession that are quite symptomatic. At this time, he needs temporary pacing wire placement. The procedure was reviewed with him and his grandson in detail, including risks and benefits. This will allow for washout from CCB to determine if his conduction will return to normal. Walter Townsend expressed verbal understanding and agrees to proceed. He will be admitted to the CCU. Routine labs will be obtained including CBC, CMET, Mg and TSH. In addition, will order echo.   Signed, Rick Duff, PA-C 08/25/2013, 2:35 PM   Agree with the history, physical and assessment as outlined. We will proceed with temporary pacer placement to allow safe washout of AV nodal blocking agents. He may require permanent pacer if AV block continues.

## 2013-08-25 NOTE — Progress Notes (Signed)
Dr. Katrinka Blazing notified of pt's elevated B/P. Orders obtained

## 2013-08-25 NOTE — Assessment & Plan Note (Addendum)
Symptoms started yesterday, uncertain if it started prior to taking his diltiazem or if heart block was initiated by his  afternoon dose of Cardizem. Likely has underlying sick sinus syndrome as typically he takes Cardizem without any symptoms .   As his son was with him today and able to escort him to the hospital, we suggested he go to [place], [place] for evaluation, possible pacemaker implant if he continues to have heart block after Cardizem  "washout".   Covering physicians were contacted at [facility], arrangements made for direct admission. Dr. Graciela Husbands unaware. Patient will likely need a short hospital course with monitoring, medication washout prior to any decision about pacer.

## 2013-08-25 NOTE — Care Management Note (Signed)
    Page 1 of 1   08/25/2013     2:03:33 PM   CARE MANAGEMENT NOTE 08/25/2013  Patient:  Walter Townsend, Walter Townsend   Account Number:  [id number]  Date Initiated:  08/25/2013  Documentation initiated by:  Junius Creamer  Subjective/Objective Assessment:   adm w brady     Action/Plan:   lives w wife, pcp dr Jonny Ruiz walker   Anticipated DC Date:     Anticipated DC Plan:  HOME/SELF CARE         Choice offered to / List presented to:             Status of service:   Medicare Important Message given?   (If response is "NO", the following Medicare IM given date fields will be blank) Date Medicare IM given:   Date Additional Medicare IM given:    Discharge Disposition:    Per UR Regulation:  Reviewed for med. necessity/level of care/duration of stay  If discussed at Long Length of Stay Meetings, dates discussed:    Comments:

## 2013-08-26 ENCOUNTER — Ambulatory Visit: Payer: Medicare Other | Admitting: Cardiovascular Disease

## 2013-08-26 ENCOUNTER — Encounter (HOSPITAL_COMMUNITY)
Admission: AD | Disposition: A | Discharge status: Home / Self Care | Payer: Self-pay | Source: Ambulatory Visit | Attending: Cardiovascular Disease

## 2013-08-26 DIAGNOSIS — I517 Cardiomegaly: Secondary | ICD-10-CM

## 2013-08-26 DIAGNOSIS — I442 Atrioventricular block, complete: Secondary | ICD-10-CM

## 2013-08-26 DIAGNOSIS — I10 Essential (primary) hypertension: Secondary | ICD-10-CM

## 2013-08-26 HISTORY — PX: PACEMAKER INSERTION: SHX728

## 2013-08-26 HISTORY — PX: PERMANENT PACEMAKER INSERTION: SHX5480

## 2013-08-26 LAB — BASIC METABOLIC PANEL
CO2: 24 mEq/L (ref 19–32)
Chloride: 103 mEq/L (ref 96–112)
GFR calc Af Amer: 89 mL/min — ABNORMAL LOW (ref >90)
Glucose, Bld: 100 mg/dL — ABNORMAL HIGH (ref 70–99)
Potassium: 4.1 mEq/L (ref 3.5–5.1)
Sodium: 139 mEq/L (ref 135–145)

## 2013-08-26 LAB — TSH: TSH: 4.419 u[IU]/mL (ref 0.350–4.500)

## 2013-08-26 SURGERY — PERMANENT PACEMAKER INSERTION
Anesthesia: LOCAL

## 2013-08-26 MED ORDER — CEFAZOLIN SODIUM 1-5 GM-% IV SOLN
INTRAVENOUS | Status: AC
  Filled 2013-08-26: qty 100

## 2013-08-26 MED ORDER — FENTANYL CITRATE 0.05 MG/ML IJ SOLN
INTRAMUSCULAR | Status: AC
  Filled 2013-08-26: qty 2

## 2013-08-26 MED ORDER — YOU HAVE A PACEMAKER BOOK
Freq: Once | Status: AC
  Administered 2013-08-26: 21:00:00
  Filled 2013-08-26: qty 1

## 2013-08-26 MED ORDER — MIDAZOLAM HCL 5 MG/5ML IJ SOLN
INTRAMUSCULAR | Status: AC
  Filled 2013-08-26: qty 5

## 2013-08-26 MED ORDER — ONDANSETRON HCL 4 MG/2ML IJ SOLN: 4.0000 mg | Freq: Four times a day (QID) | INTRAMUSCULAR | Status: DC | PRN

## 2013-08-26 MED ORDER — LIDOCAINE HCL (PF) 1 % IJ SOLN
INTRAMUSCULAR | Status: AC
  Filled 2013-08-26: qty 30

## 2013-08-26 MED ORDER — ACETAMINOPHEN 325 MG PO TABS
650.0000 mg | ORAL_TABLET | ORAL | Status: DC | PRN
  Administered 2013-08-27: 05:00:00 650 mg via ORAL
  Filled 2013-08-26: qty 2

## 2013-08-26 MED ORDER — DILTIAZEM HCL ER COATED BEADS 120 MG PO CP24
120.0000 mg | ORAL_CAPSULE | Freq: Every day | ORAL | Status: DC
  Administered 2013-08-26 – 2013-08-27 (×2): 120 mg via ORAL
  Filled 2013-08-26 (×2): qty 1

## 2013-08-26 MED ORDER — CEFAZOLIN SODIUM-DEXTROSE 2-3 GM-% IV SOLR
2.0000 g | INTRAVENOUS | Status: DC
  Filled 2013-08-26: qty 50

## 2013-08-26 MED ORDER — LISINOPRIL 10 MG PO TABS
10.0000 mg | ORAL_TABLET | Freq: Every day | ORAL | Status: DC
  Administered 2013-08-26 – 2013-08-27 (×2): 10 mg via ORAL
  Filled 2013-08-26 (×2): qty 1

## 2013-08-26 MED ORDER — CHLORHEXIDINE GLUCONATE 4 % EX LIQD
CUTANEOUS | Status: AC
  Filled 2013-08-26: qty 15

## 2013-08-26 MED ORDER — SODIUM CHLORIDE 0.9 % IR SOLN
80.0000 mg | Status: DC
  Filled 2013-08-26: qty 2

## 2013-08-26 MED ORDER — ACETAMINOPHEN 325 MG PO TABS: 325.0000 mg | ORAL_TABLET | ORAL | Status: DC | PRN

## 2013-08-26 MED ORDER — CEFAZOLIN SODIUM-DEXTROSE 2-3 GM-% IV SOLR
2.0000 g | Freq: Four times a day (QID) | INTRAVENOUS | Status: DC
  Administered 2013-08-26 – 2013-08-27 (×2): 2 g via INTRAVENOUS
  Filled 2013-08-26 (×3): qty 50

## 2013-08-26 NOTE — Progress Notes (Addendum)
Dr. Ladona Townsend has been in to interview and examine the patient. Telemetry reviewed in full. Mr. Walter Townsend continues to have intermittent high grade AV block. He is now 45 hours into washout from CCB. His LVEF is normal (echo reviewed with Dr. Shirlee Townsend). He will require PPM. This recommendation was discussed with him and his granddaughter in detail. Risks, benefits and alternatives to PPM implantation were discussed. These risks include, but are not limited to, bleeding, infection, pneumothorax, perforation, tamponade, vascular damage, renal failure, lead dislodgement, MI, stroke and death. Mr. Walter Townsend expressed verbal understanding and agrees to proceed with PPM implantation.

## 2013-08-26 NOTE — Progress Notes (Signed)
Patient ID: Walter Townsend, male   DOB: Aug 23, 1925, 79 y.o.   MRN: [id number] Subjective:  Feels well. No chest pain  Objective:  Vital Signs in the last 24 hours: Temp:  [97.5 F (36.4 C)-98.3 F (36.8 C)] 98.3 F (36.8 C) (10/10 0735) Pulse Rate:  [55-94] 69 (10/09 2000) Resp:  [13-30] 16 (10/10 1100) BP: (100-201)/(53-144) 123/65 mmHg (10/10 1100) SpO2:  [88 %-100 %] 96 % (10/10 0735) Weight:  [160 lb 7.9 oz (72.8 kg)] 160 lb 7.9 oz (72.8 kg) (10/09 1252)  Intake/Output from previous day: 10/09 0701 - 10/10 0700 In: 180 [I.V.:180] Out: 2150 [Urine:2150] Intake/Output from this shift: Total I/O In: 80 [I.V.:80] Out: -   Physical Exam: Well appearing elderly man, NAD HEENT: Unremarkable Neck:  6 cm JVD, no thyromegally Back:  No CVA tenderness Lungs:  Clear with no wheezes HEART:  Regular rate rhythm, no murmurs, no rubs, no clicks Abd:  soft, positive bowel sounds, no organomegally, no rebound, no guarding Ext:  2 plus pulses, no edema, no cyanosis, no clubbing, indwelling catheter Skin:  No rashes no nodules Neuro:  CN II through XII intact, motor grossly intact  Lab Results:  Recent Labs  08/25/13 1605  WBC 9.2  HGB 15.0  PLT 208    Recent Labs  08/25/13 1605 08/26/13 0512  NA 142 139  K 4.0 4.1  CL 104 103  CO2 27 24  GLUCOSE 100* 100*  BUN 14 12  CREATININE 0.91 0.87   No results found for this basename: TROPONINI, CK, MB,  in the last 72 hours Hepatic Function Panel  Recent Labs  08/25/13 1605  PROT 7.8  ALBUMIN 3.8  AST 20  ALT 25  ALKPHOS 98  BILITOT 0.3   No results found for this basename: CHOL,  in the last 72 hours No results found for this basename: PROTIME,  in the last 72 hours  Imaging: No results found.  Cardiac Studies: Tele - nsr with RBBB, minimal pacing Assessment/Plan:  1. CHB on calcium channel blockers, now resolved essentially. Will recheck later today with expectation that temporary pm be removed. I suspect he  will for now get by without need for PPM.  LOS: 1 day    Gregg Taylor,M.D. 08/26/2013, 12:00 PM

## 2013-08-26 NOTE — CV Procedure (Signed)
DDD PM insertion via the left subclavian vein without immediate complication. K#[id number].

## 2013-08-26 NOTE — H&P (View-Only) (Signed)
Patient ID: Walter Townsend, male   DOB: 02/19/1925, 79 y.o.   MRN: [id number] Subjective:  Feels well. No chest pain  Objective:  Vital Signs in the last 24 hours: Temp:  [97.5 F (36.4 C)-98.3 F (36.8 C)] 98.3 F (36.8 C) (10/10 0735) Pulse Rate:  [55-94] 69 (10/09 2000) Resp:  [13-30] 16 (10/10 1100) BP: (100-201)/(53-144) 123/65 mmHg (10/10 1100) SpO2:  [88 %-100 %] 96 % (10/10 0735) Weight:  [160 lb 7.9 oz (72.8 kg)] 160 lb 7.9 oz (72.8 kg) (10/09 1252)  Intake/Output from previous day: 10/09 0701 - 10/10 0700 In: 180 [I.V.:180] Out: 2150 [Urine:2150] Intake/Output from this shift: Total I/O In: 80 [I.V.:80] Out: -   Physical Exam: Well appearing elderly man, NAD HEENT: Unremarkable Neck:  6 cm JVD, no thyromegally Back:  No CVA tenderness Lungs:  Clear with no wheezes HEART:  Regular rate rhythm, no murmurs, no rubs, no clicks Abd:  soft, positive bowel sounds, no organomegally, no rebound, no guarding Ext:  2 plus pulses, no edema, no cyanosis, no clubbing, indwelling catheter Skin:  No rashes no nodules Neuro:  CN II through XII intact, motor grossly intact  Lab Results:  Recent Labs  08/25/13 1605  WBC 9.2  HGB 15.0  PLT 208    Recent Labs  08/25/13 1605 08/26/13 0512  NA 142 139  K 4.0 4.1  CL 104 103  CO2 27 24  GLUCOSE 100* 100*  BUN 14 12  CREATININE 0.91 0.87   No results found for this basename: TROPONINI, CK, MB,  in the last 72 hours Hepatic Function Panel  Recent Labs  08/25/13 1605  PROT 7.8  ALBUMIN 3.8  AST 20  ALT 25  ALKPHOS 98  BILITOT 0.3   No results found for this basename: CHOL,  in the last 72 hours No results found for this basename: PROTIME,  in the last 72 hours  Imaging: No results found.  Cardiac Studies: Tele - nsr with RBBB, minimal pacing Assessment/Plan:  1. CHB on calcium channel blockers, now resolved essentially. Will recheck later today with expectation that temporary pm be removed. I suspect he  will for now get by without need for PPM.  LOS: 1 day    Passion Lavin,M.D. 08/26/2013, 12:00 PM    

## 2013-08-26 NOTE — Discharge Summary (Addendum)
ELECTROPHYSIOLOGY DISCHARGE SUMMARY    Patient ID: Walter Townsend,  MRN: [id number], DOB/AGE: [birth year] 79 y.o.  Admit date: 08/25/2013 Discharge date: 08/27/2013  Primary Care Physician: Yates Decamp, MD Primary Cardiologist: Mariah Milling, MD  Primary Discharge Diagnosis:  1. Complete heart block with pauses ~5 seconds in duration, transient, s/p St. Jude Assurity DDD pacemaker implantation this admission  Secondary Discharge Diagnoses:  1. Symptomatic PVCs 2. HTN 3. Dyslipidemia 4. Pulmonary fibrosis  Procedures This Admission:  1. Temporary transvenous pacing wire placement 08/25/2013 2. 2D echocardiogram 08/26/2013 Study Conclusions - Left ventricle: The cavity size was normal. Wall thickness was increased in a pattern of mild LVH. Systolic function was normal. The estimated ejection fraction was in the range of 60% to 65%. Wall motion was normal; there were no regional wall motion abnormalities. Doppler parameters are consistent with abnormal left ventricular relaxation (grade 1 diastolic dysfunction). - Aortic valve: Trileaflet; moderately calcified leaflets. Sclerosis without stenosis. Valve area: 2.24cm^2(VTI). Valve area: 1.98cm^2 (Vmax). - Mitral valve: Mildly to moderately calcified annulus. Mildly calcified leaflets . No significant regurgitation. - Left atrium: The atrium was mildly dilated. - Right ventricle: The cavity size was normal. Systolic function was normal. - Right atrium: The atrium was mildly dilated. - Tricuspid valve: Peak RV-RA gradient:52mm Hg (S). - Pulmonary arteries: PA peak pressure: 35mm Hg (S). - Inferior vena cava: The vessel was normal in size; the respirophasic diameter changes were in the normal range (= 50%); findings are consistent with normal central venous pressure. Impression: -Normal LV size with mild LV hypertrophy. EF 60-65%. Normal RV size and systolic function. Aortic sclerosis without stenosis. 3. Dual chamber PPM  implantation ([employer]) 08/26/2013  History:  Walter Townsend is a pleasant 79 year old man with HTN, dyslipidemia, pulmonary fibrosis and symptomatic PVCs on diltiazem who was admitted on 08/25/2013 with complete heart block. He reports taking diltiazem "for years." He takes diltiazem CD 120 mg daily and takes short-acting 30 mg as needed. On the day of admission he was feeling increased palpitations and dizziness so he took a short acting dose in addition to his long-acting maintenance dose at 6:00 pm on 08/24/2013. He states this made him feel worse. He describes intermittent near syncope anytime he tried to walk or exert. He then developed dizziness while lying down which prompted him to seek medical attention. He denied frank syncope. He denied CP or SOB. He does not have any history of CAD/MI/valvular heart disease or HF.   Hospital Course: Mr. Manfredo was admitted to the CCU. His labs, including CBC, CMET, Mg and TSH, were unremarkable. A temporary pacing wire was placed. Diltiazem was discontinued. An echo was done revealing normal LVEF and no significant valvular abnormalities.   After CCB washout, he continued to have intermittent high grade AV block. Dr. Ladona Ridgel recommended PPM. On 08/26/2013 he underwent dual chamber PPM implantation. Mr. Arriaga tolerated this procedure well without any immediate complication.   His blood pressure was elevated on admission and he required when necessary hydralazine IV for better blood pressure control. Lisinopril 10 mg daily was added to his medication regimen and he is tolerating this well. He remains hemodynamically stable and afebrile.   Post-procedure, his chest xray shows stable lead placement without pneumothorax. His device interrogation shows normal PPM function with stable lead parameters/measurements. His implant site is intact without significant bleeding or hematoma. He has been given discharge instructions including wound care and activity  restrictions. He will follow-up in 10 days for wound check.  He has been seen, examined and deemed stable for discharge today by Dr. Eldridge Dace.  SVT listed in problem list.  Would continue diltiazem for now given that pacer is in place and BP has not been too low.  Discharge Vitals: Blood pressure 136/68, pulse 70, temperature 97.4 F (36.3 C), temperature source Oral, resp. rate 18, height 5\' 8"  (1.727 m), weight 160 lb 11.5 oz (72.9 kg), SpO2 93.00%.   Labs: Mg 2.0 TSH 4.419    Component Value Date/Time   WBC 9.2 08/25/2013 1605   RBC 4.68 08/25/2013 1605   HGB 15.0 08/25/2013 1605   HCT 42.5 08/25/2013 1605   PLT 208 08/25/2013 1605   MCV 90.8 08/25/2013 1605   MCH 32.1 08/25/2013 1605   MCHC 35.3 08/25/2013 1605   RDW 13.6 08/25/2013 1605   LYMPHSABS 3.6 08/25/2013 1605   MONOABS 1.0 08/25/2013 1605   EOSABS 0.2 08/25/2013 1605   BASOSABS 0.1 08/25/2013 1605    Recent Labs Lab 08/25/13 1605 08/26/13 0512  NA 142 139  K 4.0 4.1  CL 104 103  CO2 27 24  BUN 14 12  CREATININE 0.91 0.87  CALCIUM 9.7 9.2  PROT 7.8  --   BILITOT 0.3  --   ALKPHOS 98  --   ALT 25  --   AST 20  --   GLUCOSE 100* 100*    Disposition:  The patient is being discharged in stable condition.  Follow-up:     Follow-up Information   Follow up with [facility] On 09/07/2013. (At 3:30 PM for wound check)    Specialty:  Cardiology   Contact information:   [address] [place] [address] [phone]      Follow up with Sherryl Manges, MD In 3 months. (For pacemaker follow-up; Our office will mail letter)    Specialty:  Cardiology   Contact information:   [address]   [place] [address] [phone]      Discharge Medications:    Medication List         aspirin 81 MG EC tablet  Take 81 mg by mouth at bedtime.     atorvastatin 20 MG tablet  Commonly known as:  LIPITOR  Take 20 mg by mouth daily.     Calcium-Vitamin D  600-200 MG-UNIT Caps  Take 1 capsule by mouth daily.     cyanocobalamin 100 MCG tablet  Take 100 mcg by mouth daily.     diltiazem 120 MG 24 hr capsule  Commonly known as:  CARDIZEM CD  Take 120 mg by mouth daily.     docusate sodium 100 MG capsule  Commonly known as:  COLACE  Take 100 mg by mouth at bedtime as needed for constipation.     Garlic Oil 1000 MG Caps  Take 1 capsule by mouth daily.     glucosamine-chondroitin 500-400 MG tablet  Take 1 tablet by mouth daily.     lansoprazole 30 MG capsule  Commonly known as:  PREVACID  Take 30 mg by mouth daily.     lisinopril 10 MG tablet  Commonly known as:  PRINIVIL,ZESTRIL  Take 1 tablet (10 mg total) by mouth daily.     multivitamin tablet  Take 1 tablet by mouth daily.     saw palmetto 160 MG capsule  Take 160 mg by mouth daily.       Duration of Discharge Encounter: Greater than 35 minutes including physician  time, discussing medicine changes, activity restrictions and followup.  Signed, Theodore Demark, PA-C 08/27/2013, 9:51 AM  I have examined the patient and reviewed assessment and plan and discussed with patient.  Agree with above as stated.  Plan for wound check at [facility] and Remo Lipps future f/u in [place] with Dr. Mariah Milling.  Stressed importance of BP control as well. Will need f/u [facility] in the next few weeks.   Karen Kinnard S.

## 2013-08-26 NOTE — Interval H&P Note (Signed)
History and Physical Interval Note:  08/26/2013 5:02 PM  Walter Townsend  has presented today for surgery, with the diagnosis of HB  The various methods of treatment have been discussed with the patient and family. After consideration of risks, benefits and other options for treatment, the patient has consented to  Procedure(s): PERMANENT PACEMAKER INSERTION (N/A) as a surgical intervention .  The patient's history has been reviewed, patient examined, no change in status, stable for surgery.  I have reviewed the patient's chart and labs.  Questions were answered to the patient's satisfaction.     Leonia Reeves.D.

## 2013-08-26 NOTE — Progress Notes (Signed)
  Echocardiogram 2D Echocardiogram has been performed.  Quintasha Gren 08/26/2013, 10:58 AM

## 2013-08-26 NOTE — Progress Notes (Addendum)
Patient Name: Walter Townsend Date of Encounter: 08/26/2013    SUBJECTIVE: Multiple calls about elevated blood pressure. The patient is also very restless as he is restrained because of the right femoral pacer area. He denies chest pain, dyspnea, and dizziness.  TELEMETRY:  Heart rate 80. Some pacing during the night Filed Vitals:   08/26/13 0500 08/26/13 0600 08/26/13 0700 08/26/13 0735  BP: 164/79 173/79 158/78   Pulse:      Temp:    98.3 F (36.8 C)  TempSrc:    Oral  Resp: 17 19 19    Height:      Weight:      SpO2:    96%    Intake/Output Summary (Last 24 hours) at 08/26/13 0806 Last data filed at 08/26/13 0700  Gross per 24 hour  Intake    180 ml  Output   2150 ml  Net  -1970 ml    LABS: Basic Metabolic Panel:  Recent Labs  96/04/54 1605 08/26/13 0512  NA 142 139  K 4.0 4.1  CL 104 103  CO2 27 24  GLUCOSE 100* 100*  BUN 14 12  CREATININE 0.91 0.87  CALCIUM 9.7 9.2  MG 2.0  --    CBC:  Recent Labs  08/25/13 1605  WBC 9.2  NEUTROABS 4.2  HGB 15.0  HCT 42.5  MCV 90.8  PLT 208    Radiology/Studies:  No acute abnormality  Physical Exam: Blood pressure 158/78, pulse 69, temperature 98.3 F (36.8 C), temperature source Oral, resp. rate 19, height 5\' 8"  (1.727 m), weight 160 lb 7.9 oz (72.8 kg), SpO2 96.00%. Weight change:    No pericardial friction rub, or murmur  ASSESSMENT:  1. Severe blood pressure elevation , related to medication withdrawal  2. High-grade AV block, transient. AV nodal blocking agents have now been discontinued for near 24 hours   Plan:  1. Spoke with Dr. Graciela Husbands morning, who feels that we should monitor the patient for another 24 hours off medications for pacemaker placement. This would potentially lead to pacemaker insertion tomorrow morning.  2. Add an ACE inhibitor, orally, for blood pressure control.  3. Decreased pacemaker rate to 45 beats per minute.  Windy Fast W 08/26/2013, 8:06 AM

## 2013-08-27 ENCOUNTER — Encounter (HOSPITAL_COMMUNITY): Payer: Self-pay | Admitting: Physician Assistant

## 2013-08-27 ENCOUNTER — Inpatient Hospital Stay (HOSPITAL_COMMUNITY): Payer: Medicare Other

## 2013-08-27 MED ORDER — LISINOPRIL 10 MG PO TABS: 10.0000 mg | ORAL_TABLET | Freq: Every day | ORAL | Status: DC

## 2013-08-27 NOTE — Op Note (Signed)
NAMECLINTEN, Walter Townsend             ACCOUNT NO.:  [id number]  MEDICAL RECORD NO.:  [id number]  LOCATION:  6C03C                        FACILITY:  [facility]  PHYSICIAN:  Doylene Canning. Ladona Ridgel, MD    DATE OF BIRTH:  08/11/1925  DATE OF PROCEDURE:  08/26/2013 DATE OF DISCHARGE:                              OPERATIVE REPORT   PROCEDURE PERFORMED:  Insertion of a dual-chamber pacemaker.  INDICATION:  Symptomatic complete heart block with persistent heart block despite discontinuation of AV nodal blocking drugs and no other reversible causes.  INTRODUCTION:  The patient is an 79 year old man who was presented to the hospital after developing complete heart block with long pauses.  He had been on Cardizem for many years.  He had taken an extra p.r.n. dose. He subsequently developed complete heart block with long escape with a 6- second pause.  The patient was observed for 44 hours.  During that time, he underwent insertion of a temporary transvenous pacemaker for bradycardia support.  Because of persistence of complete heart block 44 hours after discontinuation of all AV nodal blocking agents and because the patient has right bundle-branch block at baseline with first-degree AV block, it was deemed most appropriate to proceed with dual-chamber pacemaker insertion.  PROCEDURE:  After informed consent was obtained, the patient was taken to the diagnostic EP lab in a fasting state.  After usual preparation and draping, intravenous fentanyl and midazolam was given for sedation. 30 mL of lidocaine was infiltrated into the left infraclavicular region. A 5-cm incision was carried out over this region.  Electrocautery was utilized to dissect down to the fascial plane.  The left subclavian vein was then punctured x2 and the St. Jude model 2088T 52 cm active fixation pacing lead, serial number [id number] was advanced to the right ventricle.  The St. Jude model 2088T 46 cm active fixation pacing lead, serial  number [id number] was advanced to the right atrium.  Mapping was carried out first in the right ventricle and at the final site, the R- waves were 8 with the lead actively fixed, the pacing impedance was 700 ohms, the threshold 0.9 V at 0.4 milliseconds.  10 V pacing did not stimulate the diaphragm.  There was large injury current with active fixation of the lead.  With these satisfactory parameters, attention was then turned to placement of the atrial lead, which was placed in the anterolateral portion of the right atrium.  The P-waves measured 4 mV. The pacing impedance was 400 ohms.  Threshold with lead actively fixed 0.9 V at 0.4 milliseconds.  With these satisfactory parameters, the leads were secured to the sub pectoralis fascia with a figure-of-eight silk suture and the sewing sleeve was secured with silk suture. Electrocautery was then utilized to make a subcutaneous pocket. Antibiotic irrigation was utilized to irrigate the pocket and electrocautery was utilized to assure hemostasis.  The St. Jude Assurity dual-chamber pacemaker, serial number [id number] was connected to the atrial and ventricular pacing leads and placed back in the subcutaneous pocket.  The pocket was irrigated with antibiotic irrigation and the incision was closed with 2-0 and 3-0 Vicryl.  Benzoin and Steri-Strips were painted on the skin, pressure dressing was applied, the  patient was returned to his room in satisfactory condition.  COMPLICATIONS:  There were no immediate procedure complications.  RESULTS:  This demonstrate successful implantation of a St. Jude dual- chamber pacemaker in a patient with symptomatic complete heart block despite discontinuation of calcium channel blockers.     Doylene Canning. Ladona Ridgel, MD     GWT/MEDQ  D:  08/26/2013  T:  08/27/2013  Job:  [id number]  cc:   Antonieta Iba, MD

## 2013-08-27 NOTE — Progress Notes (Signed)
Patient Name: Walter Townsend Date of Encounter: 08/27/2013  Active Problems:   AV block, complete - s/p St. Jude Assurity DDD pacemaker    SUBJECTIVE: No pain at the pacemaker site, no shortness of breath or weakness. Thinks Tylenol will be adequate for pain control  OBJECTIVE Filed Vitals:   08/27/13 0400 08/27/13 0500 08/27/13 0730 08/27/13 0800  BP: 107/64 131/60 106/63 136/68  Pulse:  74 70 70  Temp:  97.6 F (36.4 C) 97.4 F (36.3 C)   TempSrc:  Oral Oral   Resp: 18  18   Height:      Weight:      SpO2: 96% 98% 99% 93%    Intake/Output Summary (Last 24 hours) at 08/27/13 0827 Last data filed at 08/27/13 0546  Gross per 24 hour  Intake    560 ml  Output    600 ml  Net    -40 ml   Filed Weights   08/25/13 1252 08/27/13 0000  Weight: 160 lb 7.9 oz (72.8 kg) 160 lb 11.5 oz (72.9 kg)    PHYSICAL EXAM General: Well developed, well nourished, male in no acute distress. Head: Normocephalic, atraumatic.  Neck: Supple without bruits, JVD not elevated. Lungs:  Resp regular and unlabored, few rales bases. Pacer site without hematoma Heart: RRR, S1, S2, no S3, S4, or murmur; no rub. Abdomen: Soft, non-tender, non-distended, BS + x 4.  Extremities: No clubbing, cyanosis, no edema.  Neuro: Alert and oriented X 3. Moves all extremities spontaneously. Psych: Normal affect.  LABS: CBC: Recent Labs  08/25/13 1605  WBC 9.2  NEUTROABS 4.2  HGB 15.0  HCT 42.5  MCV 90.8  PLT 208   Basic Metabolic Panel: Recent Labs  08/25/13 1605 08/26/13 0512  NA 142 139  K 4.0 4.1  CL 104 103  CO2 27 24  GLUCOSE 100* 100*  BUN 14 12  CREATININE 0.91 0.87  CALCIUM 9.7 9.2  MG 2.0  --    Liver Function Tests: Recent Labs  08/25/13 1605  AST 20  ALT 25  ALKPHOS 98  BILITOT 0.3  PROT 7.8  ALBUMIN 3.8   Thyroid Function Tests: Recent Labs  08/25/13 1605  TSH 4.419   TELE: Sinus rhythm, V Pacing as needed     Radiology/Studies: Dg Chest 2  View 08/27/2013   CLINICAL DATA:  Status post pacemaker insertion.  EXAM: CHEST  2 VIEW  COMPARISON:  Chest CT, 10/13/2011.  FINDINGS: Left anterior chest wall pacemaker has its leads within the right atrium and right ventricle, well positioned.  The cardiac silhouette is normal in size. The mediastinum is normal in contour.  Lungs show changes of heterogeneous interstitial fibrosis, but no acute findings.  No pneumothorax.  IMPRESSION: New left anterior chest wall pacemaker has its leads well-positioned in the right atrium right ventricle. No pneumothorax.   Electronically Signed   By: Amie Portland M.D.   On: 08/27/2013 07:51     Current Medications:  .  ceFAZolin (ANCEF) IV  2 g Intravenous Q6H  . diltiazem  120 mg Oral Daily  . lisinopril  10 mg Oral Daily   . sodium chloride 20 mL/hr (08/25/13 2358)    ASSESSMENT AND PLAN: Active Problems:   AV block, complete - status post St. Jude Assurity DDD pacemaker; post procedure chest x-ray shows no complications the patient is doing well. Discharge home today with outpatient followup as arranged.  Melida Quitter , PA-C 8:27 AM 08/27/2013  Electrophysiology  attending  Patient seen and examined. Agree with the above history, exam, assessment, and plan. We'll discharge home with usual followup.  Lewayne Bunting, M.D.

## 2013-08-30 ENCOUNTER — Ambulatory Visit: Payer: Medicare Other | Admitting: Cardiovascular Disease

## 2013-09-07 ENCOUNTER — Ambulatory Visit (INDEPENDENT_AMBULATORY_CARE_PROVIDER_SITE_OTHER): Payer: Medicare Other | Admitting: *Deleted

## 2013-09-07 DIAGNOSIS — I442 Atrioventricular block, complete: Secondary | ICD-10-CM

## 2013-09-07 DIAGNOSIS — Z950 Presence of cardiac pacemaker: Secondary | ICD-10-CM

## 2013-09-07 LAB — PACEMAKER DEVICE OBSERVATION
AL IMPEDENCE PM: 475 Ohm
AL THRESHOLD: 0.5 V
ATRIAL PACING PM: 21
BAMS-0001: 150 {beats}/min
BAMS-0003: 70 {beats}/min
BATTERY VOLTAGE: 3.0381 V
RV LEAD AMPLITUDE: 7.2 mv
VENTRICULAR PACING PM: 21

## 2013-09-07 NOTE — Progress Notes (Signed)
Pt seen in device clinic for follow up of recently implanted pacemaker.  Wound well healed.  No redness, swelling, or edema.  Steri-strips removed today.   Device interrogated and found to be functioning normally.  No changes made today. See PaceArt for full details.  Pt denies chest pain, shortness of breath, palpitations, or dizziness.  Pt to follow up with Dr. Herbert Moors in 3 months.   Walter Townsend 09/07/2013 4:22 PM

## 2013-09-14 ENCOUNTER — Encounter: Payer: Self-pay | Admitting: Internal Medicine

## 2013-09-20 ENCOUNTER — Other Ambulatory Visit: Payer: Self-pay | Admitting: *Deleted

## 2013-09-20 MED ORDER — LISINOPRIL 10 MG PO TABS: 10.0000 mg | ORAL_TABLET | Freq: Every day | ORAL | Status: DC

## 2013-09-20 MED ORDER — LANSOPRAZOLE 30 MG PO CPDR: 30.0000 mg | DELAYED_RELEASE_CAPSULE | Freq: Every day | ORAL | Status: DC

## 2013-09-20 MED ORDER — DILTIAZEM HCL ER COATED BEADS 120 MG PO CP24: 120.0000 mg | ORAL_CAPSULE | Freq: Every day | ORAL | Status: DC

## 2013-09-20 MED ORDER — ATORVASTATIN CALCIUM 20 MG PO TABS: 20.0000 mg | ORAL_TABLET | Freq: Every day | ORAL | Status: DC

## 2013-09-20 NOTE — Telephone Encounter (Signed)
Requested Prescriptions   Signed Prescriptions Disp Refills  . atorvastatin (LIPITOR) 20 MG tablet 90 tablet 3    Sig: Take 1 tablet (20 mg total) by mouth daily.    Authorizing Provider: Antonieta Iba    Ordering User: Shawnie Dapper, Abe [employer] C  . lisinopril (PRINIVIL,ZESTRIL) 10 MG tablet 90 tablet 3    Sig: Take 1 tablet (10 mg total) by mouth daily.    Authorizing Provider: Antonieta Iba    Ordering User: Kendrick Fries lansoprazole (PREVACID) 30 MG capsule 90 capsule 3    Sig: Take 1 capsule (30 mg total) by mouth daily.    Authorizing Provider: Antonieta Iba    Ordering User: Kendrick Fries diltiazem (CARDIZEM CD) 120 MG 24 hr capsule 90 capsule 3    Sig: Take 1 capsule (120 mg total) by mouth daily.    Authorizing Provider: Antonieta Iba    Ordering User: Kendrick Fries

## 2013-11-29 ENCOUNTER — Encounter: Payer: Self-pay | Admitting: Internal Medicine

## 2013-11-29 ENCOUNTER — Ambulatory Visit (INDEPENDENT_AMBULATORY_CARE_PROVIDER_SITE_OTHER): Payer: Medicare Other | Admitting: Internal Medicine

## 2013-11-29 VITALS — BP 164/97 | HR 63 | Ht 68.0 in | Wt 168.8 lb

## 2013-11-29 DIAGNOSIS — I442 Atrioventricular block, complete: Secondary | ICD-10-CM

## 2013-11-29 DIAGNOSIS — I471 Supraventricular tachycardia: Secondary | ICD-10-CM

## 2013-11-29 DIAGNOSIS — Z45018 Encounter for adjustment and management of other part of cardiac pacemaker: Secondary | ICD-10-CM | POA: Insufficient documentation

## 2013-11-29 DIAGNOSIS — I10 Essential (primary) hypertension: Secondary | ICD-10-CM

## 2013-11-29 LAB — MDC_IDC_ENUM_SESS_TYPE_INCLINIC
Battery Remaining Longevity: 108 mo
Battery Voltage: 2.99 V
Brady Statistic RA Percent Paced: 24 %
Brady Statistic RV Percent Paced: 18 %
Implantable Pulse Generator Model: 2240
Lead Channel Impedance Value: 440 Ohm
Lead Channel Impedance Value: 460 Ohm
Lead Channel Pacing Threshold Amplitude: 0.875 V
Lead Channel Pacing Threshold Amplitude: 1.25 V
Lead Channel Pacing Threshold Pulse Width: 0.6 ms
Lead Channel Sensing Intrinsic Amplitude: 5 mV
Lead Channel Setting Pacing Amplitude: 0.875
Lead Channel Setting Pacing Pulse Width: 0.4 ms
MDC IDC MSMT LEADCHNL RV PACING THRESHOLD PULSEWIDTH: 0.4 ms
MDC IDC MSMT LEADCHNL RV SENSING INTR AMPL: 3 mV
MDC IDC MSMT LEADCHNL RV SENSING INTR AMPL: 4 mV
MDC IDC PG SERIAL: 2980518
MDC IDC SET LEADCHNL RA PACING AMPLITUDE: 2.5 V
MDC IDC SET LEADCHNL RV SENSING SENSITIVITY: 3 mV

## 2013-11-29 NOTE — Patient Instructions (Signed)
Remote monitoring is used to monitor your Pacemaker of ICD from home. This monitoring reduces the number of office visits required to check your device to one time per year. It allows us to keep an eye on the functioning of your device to ensure it is working properly. You are scheduled for a device check from home on 03/02/14 Merlin. You may send your transmission at any time that day. If you have a wireless device, the transmission will be sent automatically. After your physician reviews your transmission, you will receive a postcard with your next transmission date.  Your physician wants you to follow-up on 08/2014 with Dr. Graciela HusbandsKlein. You will receive a reminder letter in the mail two months in advance. If you don't receive a letter, please call our office to schedule the follow-up appointment.

## 2013-11-29 NOTE — Assessment & Plan Note (Signed)
The patient the patient paces 40% of the time in the a and V

## 2013-11-29 NOTE — Assessment & Plan Note (Addendum)
The patient's device was interrogated and the information was fully reviewed.  The device was reprogrammed to maximize longevity  We discussed multiple concerns he had regarding his pacemaker. These included the use of yard tools. 1 issue with a certain as to what the role of a backpack carrying loading machine with the gas motor; we'll look into this for him

## 2013-11-29 NOTE — Assessment & Plan Note (Signed)
Low-dose treatment with diltiazem resulted in significant hypotension. At home he says his blood pressures in the 120-130 range. Hence we will discontinue his diltiazem

## 2013-11-29 NOTE — Progress Notes (Signed)
      Patient Care Team: Rafael BihariJohn B Walker III, MD as PCP - General (Unknown Physician Specialty)   HPI  Walter Townsend is a 80 y.o. male Seen following pacemaker implantation 10/14 for symptomatic high-grade heart block . Echo at that time demonstrated normal left ventricular function  He has had no more slow heartbeats. Exercise tolerance is stable. Past Medical History  Diagnosis Date  . Rapid palpitations   . Hyperlipidemia   . Hypertension   . Pulmonary fibrosis   . Prostate enlargement     Past Surgical History  Procedure Laterality Date  . Foot surgery    . Back surgery    . Hernia repair    . Pacemaker insertion  08/26/2013    St. Jude Assurity DDD pacemaker    Current Outpatient Prescriptions  Medication Sig Dispense Refill  . aspirin 81 MG EC tablet Take 81 mg by mouth at bedtime.       Marland Kitchen. atorvastatin (LIPITOR) 20 MG tablet Take 1 tablet (20 mg total) by mouth daily.  90 tablet  3  . Calcium Carbonate-Vitamin D (CALCIUM-VITAMIN D) 600-200 MG-UNIT CAPS Take 1 capsule by mouth daily.        . cyanocobalamin 100 MCG tablet Take 100 mcg by mouth daily.      Marland Kitchen. diltiazem (CARDIZEM CD) 120 MG 24 hr capsule Take 1 capsule (120 mg total) by mouth daily.  90 capsule  3  . docusate sodium (COLACE) 100 MG capsule Take 100 mg by mouth at bedtime as needed for constipation.      . Garlic Oil 1000 MG CAPS Take 1 capsule by mouth daily.        Marland Kitchen. glucosamine-chondroitin 500-400 MG tablet Take 1 tablet by mouth daily.        . lansoprazole (PREVACID) 30 MG capsule Take 1 capsule (30 mg total) by mouth daily.  90 capsule  3  . Multiple Vitamin (MULTIVITAMIN) tablet Take 1 tablet by mouth daily.        . saw palmetto 160 MG capsule Take 160 mg by mouth daily.         No current facility-administered medications for this visit.    No Known Allergies  Review of Systems negative except from HPI and PMH  Physical Exam BP 164/97  Pulse 63  Ht 5\' 8"  (1.727 m)  Wt 168 lb 12 oz  (76.544 kg)  BMI 25.66 kg/m2 Well developed and nourished in no acute distress HENT normal Neck supple with JVP-flat Clear Regular rate and rhythm, no murmurs or gallops Abd-soft with active BS No Clubbing cyanosis edema Skin-warm and dry A & Oriented  Grossly normal sensory and motor function  ECG demonstrates sinus rhythm with right bundle branch block and left axis deviation   Assessment and  Plan

## 2014-02-24 ENCOUNTER — Telehealth: Payer: Self-pay | Admitting: Internal Medicine

## 2014-02-24 NOTE — Telephone Encounter (Signed)
New message     Talk to [place] City[place]ula or EdieKristin.  Pt has a merlin box made by [employer].  Please call him and let him know if he needs to do anything for his remote transmission

## 2014-02-24 NOTE — Telephone Encounter (Signed)
Left message with wife for pt to call.

## 2014-03-02 ENCOUNTER — Ambulatory Visit (INDEPENDENT_AMBULATORY_CARE_PROVIDER_SITE_OTHER): Payer: Medicare Other | Admitting: *Deleted

## 2014-03-02 ENCOUNTER — Encounter: Payer: Self-pay | Admitting: Internal Medicine

## 2014-03-02 DIAGNOSIS — I442 Atrioventricular block, complete: Secondary | ICD-10-CM

## 2014-03-02 DIAGNOSIS — I471 Supraventricular tachycardia: Secondary | ICD-10-CM

## 2014-03-06 LAB — MDC_IDC_ENUM_SESS_TYPE_REMOTE
Brady Statistic AP VP Percent: 1 %
Brady Statistic AP VS Percent: 25 %
Brady Statistic AS VS Percent: 74 %
Brady Statistic RA Percent Paced: 25 %
Brady Statistic RV Percent Paced: 1 %
Implantable Pulse Generator Model: 2240
Lead Channel Impedance Value: 400 Ohm
Lead Channel Impedance Value: 450 Ohm
Lead Channel Pacing Threshold Amplitude: 0.875 V
Lead Channel Pacing Threshold Amplitude: 1.25 V
Lead Channel Pacing Threshold Pulse Width: 0.6 ms
Lead Channel Sensing Intrinsic Amplitude: 2.2 mV
Lead Channel Setting Pacing Amplitude: 1.125
Lead Channel Setting Pacing Amplitude: 2.5 V
Lead Channel Setting Pacing Pulse Width: 0.4 ms
Lead Channel Setting Sensing Sensitivity: 1.5 mV
MDC IDC MSMT BATTERY REMAINING LONGEVITY: 120 mo
MDC IDC MSMT BATTERY VOLTAGE: 3.01 V
MDC IDC MSMT LEADCHNL RA SENSING INTR AMPL: 3.9 mV
MDC IDC MSMT LEADCHNL RV PACING THRESHOLD PULSEWIDTH: 0.4 ms
MDC IDC PG SERIAL: 2980518
MDC IDC SESS DTM: 20150416060012
MDC IDC STAT BRADY AS VP PERCENT: 1 %

## 2014-03-13 ENCOUNTER — Telehealth: Payer: Self-pay

## 2014-03-13 NOTE — Telephone Encounter (Signed)
Pt called back stating that he will take the next available appt, as "they are not going to call me back". Pt was offered appt to see Corine ShelterLuke Kilroy, PA, but he would prefer to see Dr. Mariah MillingGollan.   He requests that Dr. Mariah MillingGollan still call him back, as "he told me to call anytime I need to be seen and he will work me in".  Please advise.  Thank you.

## 2014-03-13 NOTE — Telephone Encounter (Signed)
Spoke w/ pt.  He reports SOB on Saturday, a little yesterday, but no symptoms today.  Reports that he is nauseated, but denies any pain.  Pt would like to be seen in clinic today.  Advised him that Dr. Mariah MillingGollan has no openings today, but would let him know what is going on.   Pt states that he will be bringing his sister to her appt tomorrow and would like Dr. Mariah MillingGollan to see him at this time, as well. Pt states "have Dr. Mariah MillingGollan call me back today.  I will be expecting his call". Please advise.  Thank you.

## 2014-03-13 NOTE — Telephone Encounter (Signed)
Potentially we can see him at 3:30 on Tuesday

## 2014-03-13 NOTE — Telephone Encounter (Signed)
Pt called and states he has SOB, and nauseated, no pain. Please call.

## 2014-03-13 NOTE — Telephone Encounter (Signed)
Spoke w/ pt.  Offered to possibly work him in the sched tomorrow am after Dr. Mariah MillingGollan finishes at the hospital, but he states that he has a dentist appt in the am. Advised him that we may be able to work him in tomorrow afternoon, depending on how Dr. Windell HummingbirdGollan's hospital load.  Pt denies chest pain, edema, reports that his wt has been stable.  States that believes that he was dehydrated, as he drank some Gatorade and felt much better. Reports that he was previously admitted for dehydration and that this felt similar.  Reports that during episode, his BP was normal, his HR was 65, SpO2 94%, that his heart felt like it was "skipping every other beat, then 2 beats and pause."  Reports that symptoms resolved after drinking Gatorade. Advised pt that if sx happen outside of office hours, to call EMS and have them perform an EKG. He is agreeable to this and states that he will wait to see if he can be worked in [employer] account executivetomorrow.   Otherwise, he will keep his appt next week.

## 2014-03-14 ENCOUNTER — Encounter: Payer: Self-pay | Admitting: Cardiovascular Disease

## 2014-03-14 ENCOUNTER — Ambulatory Visit (INDEPENDENT_AMBULATORY_CARE_PROVIDER_SITE_OTHER): Payer: Medicare Other | Admitting: Cardiovascular Disease

## 2014-03-14 VITALS — BP 140/72 | HR 60 | Ht 68.0 in | Wt 165.0 lb

## 2014-03-14 DIAGNOSIS — I471 Supraventricular tachycardia: Secondary | ICD-10-CM

## 2014-03-14 DIAGNOSIS — Z45018 Encounter for adjustment and management of other part of cardiac pacemaker: Secondary | ICD-10-CM

## 2014-03-14 DIAGNOSIS — E785 Hyperlipidemia, unspecified: Secondary | ICD-10-CM

## 2014-03-14 DIAGNOSIS — J8410 Pulmonary fibrosis, unspecified: Secondary | ICD-10-CM

## 2014-03-14 DIAGNOSIS — I10 Essential (primary) hypertension: Secondary | ICD-10-CM

## 2014-03-14 DIAGNOSIS — R0602 Shortness of breath: Secondary | ICD-10-CM

## 2014-03-14 NOTE — Assessment & Plan Note (Signed)
Relatively stable symptoms, previously seen by pulmonary

## 2014-03-14 NOTE — Assessment & Plan Note (Signed)
Recommended that he stay on his Lipitor 

## 2014-03-14 NOTE — Telephone Encounter (Signed)
Spoke w/pt in regards to [employer] transmission. Transmission received.

## 2014-03-14 NOTE — Progress Notes (Signed)
Patient ID: Walter Townsend, male    DOB: [birth year], 80 y.o.   MRN: [id number]  HPI Comments: 80 yo gentleman with a h/o of tachypalpitations many years ago,  well controlled on diltiazem, hyperlipidemia, GERD, esophageal stricture, worsening SOB, dx with pulmonary fibrosis,  Previously epsiodes of dizzy episodes, presenting after an episode of palpitation and SOB.  He reports that 3 days ago, he woke with tachycadia, SOB sx, lasting most of Saturday and part of Sunday. Heart rate was pounding and he was breathing rapidly, heavy. He attributes his sx to urinating a large amount that night and a change of his electrolytes. He has been urinating a good amount at night in general, worse that night. He continues to take diltiazem CD once a day.  Dizziness was not discussed today and seems to be less of an issue on todays visit. Typically he is very active    He only smoked for 4 years as a teenager but did have significant secondhand smoke. Previously seen by pulmonary for lung dz   EKG today shows paced rhythm with rate 60 bpm     Outpatient Encounter Prescriptions as of 03/14/2014  Medication Sig  . aspirin 81 MG EC tablet Take 81 mg by mouth at bedtime.   Marland Kitchen. atorvastatin (LIPITOR) 20 MG tablet Take 1 tablet (20 mg total) by mouth daily.  . Calcium Carbonate-Vitamin D (CALCIUM-VITAMIN D) 600-200 MG-UNIT CAPS Take 1 capsule by mouth daily.    . cyanocobalamin 100 MCG tablet Take 100 mcg by mouth daily.  Marland Kitchen. diltiazem (CARDIZEM CD) 120 MG 24 hr capsule Take 1 capsule (120 mg total) by mouth daily.  Marland Kitchen. docusate sodium (COLACE) 100 MG capsule Take 100 mg by mouth at bedtime as needed for constipation.  . Garlic Oil 1000 MG CAPS Take 1 capsule by mouth daily.    Marland Kitchen. glucosamine-chondroitin 500-400 MG tablet Take 1 tablet by mouth daily.    . lansoprazole (PREVACID) 30 MG capsule Take 1 capsule (30 mg total) by mouth daily.  . Multiple Vitamin (MULTIVITAMIN) tablet Take 1 tablet by mouth daily.    .  saw palmetto 160 MG capsule Take 160 mg by mouth daily.      Review of Systems  HENT: Negative.   Eyes: Negative.   Respiratory: Positive for shortness of breath.   Cardiovascular: Positive for palpitations.  Gastrointestinal: Negative.   Endocrine: Negative.   Musculoskeletal: Negative.   Skin: Negative.   Allergic/Immunologic: Negative.   Hematological: Negative.   Psychiatric/Behavioral: Negative.   All other systems reviewed and are negative.   BP 140/72  Pulse 60  Ht 5\' 8"  (1.727 m)  Wt 165 lb (74.844 kg)  BMI 25.09 kg/m2  Physical Exam  Nursing note and vitals reviewed. Constitutional: He is oriented to person, place, and time. He appears well-developed and well-nourished.  HENT:  Head: Normocephalic.  Nose: Nose normal.  Mouth/Throat: Oropharynx is clear and moist.  Eyes: Conjunctivae are normal. Pupils are equal, round, and reactive to light.  Neck: Normal range of motion. Neck supple. No JVD present.  Cardiovascular: Regular rhythm, S1 normal, S2 normal, normal heart sounds and intact distal pulses.  Bradycardia present.  Exam reveals no gallop and no friction rub.   No murmur heard. Pulmonary/Chest: Effort normal and breath sounds normal. No respiratory distress. He has no wheezes. He has no rales. He exhibits no tenderness.  Abdominal: Soft. Bowel sounds are normal. He exhibits no distension. There is no tenderness.  Musculoskeletal: Normal range of motion.  He exhibits no edema and no tenderness.  Lymphadenopathy:    He has no cervical adenopathy.  Neurological: He is alert and oriented to person, place, and time. Coordination normal.  Skin: Skin is warm and dry. No rash noted. No erythema.  Psychiatric: He has a normal mood and affect. His behavior is normal. Judgment and thought content normal.      Assessment and Plan

## 2014-03-14 NOTE — Assessment & Plan Note (Signed)
Blood pressure is well controlled on today's visit. No changes made to the medications. 

## 2014-03-14 NOTE — Assessment & Plan Note (Signed)
Remote smoking history.Pulmonary fibrosis, previously seen by Pulmonary.

## 2014-03-14 NOTE — Assessment & Plan Note (Signed)
Followed by Dr. Klein 

## 2014-03-14 NOTE — Assessment & Plan Note (Signed)
Episode of recent tachycardia, resolved without intervention. Significant urination overnight, etiology unclear. Unable to exclude this as a cause of his recent arrhythmia. No medication changes made. Recommended hydration, over the counter potassium, gatoraide G2. If he has more arrhythmia, 30 day monitor could be ordered.

## 2014-03-14 NOTE — Patient Instructions (Signed)
You are doing well. No medication changes were made.  Take over the counter potassium, magnesium as needed Try Gatoraide G2 when you urinate large amounts  Please call us if you have new issues that need to be addressed before your next appt.  Your physician wants you to follow-up in: 6 months.  You will receive a reminder letter in the mail two months in advance. If you don't receive a letter, please call our office to schedule the follow-up appointment.

## 2014-03-22 ENCOUNTER — Encounter: Payer: Self-pay | Admitting: Cardiology

## 2014-03-22 ENCOUNTER — Ambulatory Visit: Payer: Medicare Other | Admitting: Cardiovascular Disease

## 2014-05-26 ENCOUNTER — Other Ambulatory Visit: Payer: Self-pay

## 2014-05-26 MED ORDER — HYDROCORTISONE 2.5 % RE CREA: 1.0000 "application " | TOPICAL_CREAM | Freq: Three times a day (TID) | RECTAL | Status: DC

## 2014-06-05 ENCOUNTER — Ambulatory Visit (INDEPENDENT_AMBULATORY_CARE_PROVIDER_SITE_OTHER): Payer: Medicare Other | Admitting: *Deleted

## 2014-06-05 DIAGNOSIS — I442 Atrioventricular block, complete: Secondary | ICD-10-CM

## 2014-06-05 LAB — MDC_IDC_ENUM_SESS_TYPE_REMOTE
Battery Remaining Longevity: 117 mo
Battery Remaining Percentage: 95.5 %
Brady Statistic AS VS Percent: 70 %
Date Time Interrogation Session: 20150720080010
Lead Channel Pacing Threshold Amplitude: 0.875 V
Lead Channel Pacing Threshold Pulse Width: 0.4 ms
Lead Channel Sensing Intrinsic Amplitude: 3.7 mV
Lead Channel Setting Pacing Amplitude: 1.125
Lead Channel Setting Pacing Amplitude: 2.5 V
Lead Channel Setting Pacing Pulse Width: 0.4 ms
Lead Channel Setting Sensing Sensitivity: 1.5 mV
MDC IDC MSMT BATTERY VOLTAGE: 3.01 V
MDC IDC MSMT LEADCHNL RA IMPEDANCE VALUE: 450 Ohm
MDC IDC MSMT LEADCHNL RV IMPEDANCE VALUE: 380 Ohm
MDC IDC MSMT LEADCHNL RV SENSING INTR AMPL: 3 mV
MDC IDC PG SERIAL: 2980518
MDC IDC STAT BRADY AP VP PERCENT: 1 %
MDC IDC STAT BRADY AP VS PERCENT: 29 %
MDC IDC STAT BRADY AS VP PERCENT: 1 %
MDC IDC STAT BRADY RA PERCENT PACED: 29 %
MDC IDC STAT BRADY RV PERCENT PACED: 1 %

## 2014-06-05 NOTE — Progress Notes (Signed)
Remote pacemaker transmission.   

## 2014-06-06 ENCOUNTER — Encounter: Payer: Medicare Other | Admitting: *Deleted

## 2014-06-06 ENCOUNTER — Telehealth: Payer: Self-pay | Admitting: Cardiology

## 2014-06-06 NOTE — Telephone Encounter (Signed)
Confirmed remote transmission with pt wife.  

## 2014-06-07 ENCOUNTER — Encounter: Payer: Self-pay | Admitting: Cardiology

## 2014-07-07 ENCOUNTER — Encounter: Payer: Self-pay | Admitting: Cardiovascular Disease

## 2014-07-07 ENCOUNTER — Ambulatory Visit (INDEPENDENT_AMBULATORY_CARE_PROVIDER_SITE_OTHER): Payer: Medicare Other | Admitting: Cardiovascular Disease

## 2014-07-07 VITALS — BP 150/72 | HR 68 | Ht 69.0 in | Wt 170.0 lb

## 2014-07-07 DIAGNOSIS — Z45018 Encounter for adjustment and management of other part of cardiac pacemaker: Secondary | ICD-10-CM

## 2014-07-07 DIAGNOSIS — J8410 Pulmonary fibrosis, unspecified: Secondary | ICD-10-CM

## 2014-07-07 DIAGNOSIS — I10 Essential (primary) hypertension: Secondary | ICD-10-CM

## 2014-07-07 DIAGNOSIS — I471 Supraventricular tachycardia: Secondary | ICD-10-CM

## 2014-07-07 DIAGNOSIS — E785 Hyperlipidemia, unspecified: Secondary | ICD-10-CM

## 2014-07-07 DIAGNOSIS — T6701XA Heatstroke and sunstroke, initial encounter: Secondary | ICD-10-CM | POA: Insufficient documentation

## 2014-07-07 NOTE — Patient Instructions (Signed)
You are doing well. No medication changes were made.  Please continue to monitor your blood pressure  Please call us if you have new issues that need to be addressed before your next appt.  Your physician wants you to follow-up in: 6 months.  You will receive a reminder letter in the mail two months in advance. If you don't receive a letter, please call our office to schedule the follow-up appointment.

## 2014-07-07 NOTE — Assessment & Plan Note (Signed)
He has scheduled followup with Dr. Graciela HusbandsKlein later this fall. Suspect his pacemaker is working fine

## 2014-07-07 NOTE — Assessment & Plan Note (Signed)
Prominent findings on clinical exam. Followed by pulmonary. Recommended he limit his exertion especially out in the extreme heat to avoid hypoxia

## 2014-07-07 NOTE — Assessment & Plan Note (Signed)
Suspect his symptoms one week ago were secondary to heat stroke or some stroke. Recommended he avoid extreme heat. Encouraged him to push fluids if he does spend time outside

## 2014-07-07 NOTE — Progress Notes (Signed)
Patient ID: Walter Townsend, male    DOB: September 21, 1925, 80 y.o.   MRN: [id number]  HPI Comments: 80 yo gentleman with a h/o of tachypalpitations, well controlled on diltiazem, status post pacemaker , hyperlipidemia, GERD, esophageal stricture, worsening SOB, dx with pulmonary fibrosis, presenting for followup after recent episode of hypotension.  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 He became exhausted, shortness of breath, could barely make it back into his house. He checked his blood pressure which was 95/55, heart rate 90. Uncertain if he had hypoxia. He reports it is taking in several days to recover. He was worried that his pacemaker was causing his heart rate to run high and wondered if he did something to hurt his pacemaker.  He denies having dizziness on a regular basis Typically he is very active   He reports that he has significant nocturia, peeing every 2 hours through the night. He uses a bedside commode His knee is giving him problems and he thinks he will need surgery next year   He only smoked for 4 years as a teenager but did have significant secondhand smoke. Previously seen by pulmonary for lung dz   EKG today shows normal sinus rhythm with right bundle branch block, rate 68 beats per minute     Outpatient Encounter Prescriptions as of 07/07/2014  Medication Sig  . aspirin 81 MG EC tablet Take 81 mg by mouth at bedtime.   Marland Kitchen. atorvastatin (LIPITOR) 20 MG tablet Take 1 tablet (20 mg total) by mouth daily.  . Calcium Carbonate-Vitamin D (CALCIUM-VITAMIN D) 600-200 MG-UNIT CAPS Take 1 capsule by mouth daily.    . cyanocobalamin 100 MCG tablet Take 100 mcg by mouth daily.  Marland Kitchen. diltiazem (CARDIZEM CD) 120 MG 24 hr capsule Take 1 capsule (120 mg total) by mouth daily.  Marland Kitchen. docusate sodium (COLACE) 100 MG capsule Take 100 mg by mouth at bedtime as needed for constipation.  . Garlic Oil 1000 MG CAPS Take 1 capsule by mouth daily.    Marland Kitchen.  glucosamine-chondroitin 500-400 MG tablet Take 1 tablet by mouth daily.    . hydrocortisone (ANUSOL-HC) 2.5 % rectal cream Place 1 application rectally 3 (three) times daily.  . lansoprazole (PREVACID) 30 MG capsule Take 1 capsule (30 mg total) by mouth daily.  . Multiple Vitamin (MULTIVITAMIN) tablet Take 1 tablet by mouth daily.    . saw palmetto 160 MG capsule Take 160 mg by mouth daily.    . tamsulosin (FLOMAX) 0.4 MG CAPS capsule Take 0.4 mg by mouth daily.     Review of Systems  HENT: Negative.   Eyes: Negative.   Respiratory: Positive for shortness of breath.   Cardiovascular: Negative.   Gastrointestinal: Negative.   Endocrine: Negative.   Musculoskeletal: Negative.   Skin: Negative.   Allergic/Immunologic: Negative.   Neurological: Positive for weakness.  Hematological: Negative.   Psychiatric/Behavioral: Negative.   All other systems reviewed and are negative.   BP 150/72  Pulse 68  Ht 5\' 9"  (1.753 m)  Wt 170 lb (77.111 kg)  BMI 25.09 kg/m2 Repeat blood pressure continues to run high at 160 systolic Physical Exam  Nursing note and vitals reviewed. Constitutional: He is oriented to person, place, and time. He appears well-developed and well-nourished.  HENT:  Head: Normocephalic.  Nose: Nose normal.  Mouth/Throat: Oropharynx is clear and moist.  Eyes: Conjunctivae are normal. Pupils are equal, round, and reactive to light.  Neck: Normal range of motion.  Neck supple. No JVD present.  Cardiovascular: Regular rhythm, S1 normal, S2 normal, normal heart sounds and intact distal pulses.  Bradycardia present.  Exam reveals no gallop and no friction rub.   No murmur heard. Pulmonary/Chest: Effort normal and breath sounds normal. No respiratory distress. He has no wheezes. He has no rales. He exhibits no tenderness.  Abdominal: Soft. Bowel sounds are normal. He exhibits no distension. There is no tenderness.  Musculoskeletal: Normal range of motion. He exhibits no edema and  no tenderness.  Lymphadenopathy:    He has no cervical adenopathy.  Neurological: He is alert and oriented to person, place, and time. Coordination normal.  Skin: Skin is warm and dry. No rash noted. No erythema.  Psychiatric: He has a normal mood and affect. His behavior is normal. Judgment and thought content normal.      Assessment and Plan

## 2014-07-07 NOTE — Assessment & Plan Note (Signed)
Cholesterol is at goal on the current lipid regimen. No changes to the medications were made.  

## 2014-07-07 NOTE — Assessment & Plan Note (Signed)
On his diltiazem, no recent episodes of tachycardia.

## 2014-07-07 NOTE — Assessment & Plan Note (Signed)
Blood pressures running high today. Recommended he monitor his blood pressure at home. He did report it typically runs in the 120-130 range at home. Recommended he call us next week with more numbers

## 2014-07-11 ENCOUNTER — Encounter: Payer: Self-pay | Admitting: Cardiology

## 2014-07-18 ENCOUNTER — Encounter: Payer: Self-pay | Admitting: Internal Medicine

## 2014-08-22 ENCOUNTER — Encounter: Payer: Self-pay | Admitting: Internal Medicine

## 2014-08-22 ENCOUNTER — Ambulatory Visit (INDEPENDENT_AMBULATORY_CARE_PROVIDER_SITE_OTHER): Payer: Medicare Other | Admitting: Internal Medicine

## 2014-08-22 VITALS — BP 130/80 | HR 60 | Ht 69.0 in | Wt 170.5 lb

## 2014-08-22 DIAGNOSIS — Z45018 Encounter for adjustment and management of other part of cardiac pacemaker: Secondary | ICD-10-CM

## 2014-08-22 DIAGNOSIS — I442 Atrioventricular block, complete: Secondary | ICD-10-CM

## 2014-08-22 DIAGNOSIS — I951 Orthostatic hypotension: Secondary | ICD-10-CM

## 2014-08-22 HISTORY — DX: Orthostatic hypotension: I95.1

## 2014-08-22 LAB — MDC_IDC_ENUM_SESS_TYPE_INCLINIC
Battery Remaining Longevity: 127.2 mo
Battery Voltage: 2.99 V
Brady Statistic RV Percent Paced: 0.67 %
Date Time Interrogation Session: 20151006120454
Implantable Pulse Generator Serial Number: 2980518
Lead Channel Impedance Value: 375 Ohm
Lead Channel Impedance Value: 450 Ohm
Lead Channel Pacing Threshold Amplitude: 1 V
Lead Channel Pacing Threshold Pulse Width: 0.4 ms
Lead Channel Pacing Threshold Pulse Width: 0.6 ms
Lead Channel Pacing Threshold Pulse Width: 0.6 ms
Lead Channel Sensing Intrinsic Amplitude: 3.8 mV
Lead Channel Setting Pacing Amplitude: 1.25 V
Lead Channel Setting Pacing Amplitude: 2.5 V
Lead Channel Setting Pacing Pulse Width: 0.4 ms
MDC IDC MSMT LEADCHNL RA PACING THRESHOLD AMPLITUDE: 1 V
MDC IDC MSMT LEADCHNL RA PACING THRESHOLD AMPLITUDE: 1 V
MDC IDC MSMT LEADCHNL RV SENSING INTR AMPL: 2.5 mV
MDC IDC SET LEADCHNL RV SENSING SENSITIVITY: 1.5 mV
MDC IDC STAT BRADY RA PERCENT PACED: 29 %

## 2014-08-22 MED ORDER — TAMSULOSIN HCL 0.4 MG PO CAPS: 0.4000 mg | ORAL_CAPSULE | Freq: Every day | ORAL | Status: DC

## 2014-08-22 MED ORDER — LANSOPRAZOLE 30 MG PO CPDR: 30.0000 mg | DELAYED_RELEASE_CAPSULE | Freq: Every day | ORAL | Status: DC

## 2014-08-22 MED ORDER — LISINOPRIL 5 MG PO TABS: 5.0000 mg | ORAL_TABLET | Freq: Two times a day (BID) | ORAL | Status: DC

## 2014-08-22 MED ORDER — ATORVASTATIN CALCIUM 20 MG PO TABS: 20.0000 mg | ORAL_TABLET | Freq: Every day | ORAL | Status: DC

## 2014-08-22 NOTE — Progress Notes (Signed)
Patient Care Team: Rafael Bihari, MD as PCP - General (Unknown Physician Specialty)   HPI  Walter Townsend is a 80 y.o. male Seen following pacemaker implantation 10/14 for symptomatic high-grade heart block . Echo at that time demonstrated normal left ventricular function  He has had no more slow heartbeats. Exercise tolerance is stable.  He has noted bp falls on dilt whether he takes in am or pm he has noted 90/60 He has tried lisinopril bid with much better tolerance   Past Medical History  Diagnosis Date  . Rapid palpitations   . Hyperlipidemia   . Hypertension   . Pulmonary fibrosis   . Prostate enlargement     Past Surgical History  Procedure Laterality Date  . Foot surgery    . Back surgery    . Hernia repair    . Pacemaker insertion  08/26/2013    St. Jude Assurity DDD pacemaker  . Insert / replace / remove pacemaker      Current Outpatient Prescriptions  Medication Sig Dispense Refill  . aspirin 81 MG EC tablet Take 81 mg by mouth at bedtime.       Marland Kitchen atorvastatin (LIPITOR) 20 MG tablet Take 1 tablet (20 mg total) by mouth daily.  90 tablet  3  . Calcium Carbonate-Vitamin D (CALCIUM-VITAMIN D) 600-200 MG-UNIT CAPS Take 1 capsule by mouth daily.        . cyanocobalamin 100 MCG tablet Take 100 mcg by mouth daily.      Marland Kitchen diltiazem (CARDIZEM CD) 120 MG 24 hr capsule Take 1 capsule (120 mg total) by mouth daily.  90 capsule  3  . docusate sodium (COLACE) 100 MG capsule Take 100 mg by mouth at bedtime as needed for constipation.      . Garlic Oil 1000 MG CAPS Take 1 capsule by mouth daily.        Marland Kitchen glucosamine-chondroitin 500-400 MG tablet Take 1 tablet by mouth daily.        . hydrocortisone (ANUSOL-HC) 2.5 % rectal cream Place 1 application rectally 3 (three) times daily.  30 g  1  . lansoprazole (PREVACID) 30 MG capsule Take 1 capsule (30 mg total) by mouth daily.  90 capsule  3  . lisinopril (PRINIVIL,ZESTRIL) 10 MG tablet Take 10 mg by mouth daily.       . Multiple Vitamin (MULTIVITAMIN) tablet Take 1 tablet by mouth daily.        . saw palmetto 160 MG capsule Take 160 mg by mouth daily.        . tamsulosin (FLOMAX) 0.4 MG CAPS capsule Take 0.4 mg by mouth daily.        No current facility-administered medications for this visit.    No Known Allergies  Review of Systems negative except from HPI and PMH  Physical Exam BP 130/80  Pulse 60  Ht 5\' 9"  (1.753 m)  Wt 170 lb 8 oz (77.338 kg)  BMI 25.17 kg/m2 Well developed and nourished in no acute distress HENT normal Neck supple with JVP-flat Clear Regular rate and rhythm, no murmurs or gallops Abd-soft with active BS No Clubbing cyanosis edema Skin-warm and dry A & Oriented  Grossly normal sensory and motor function  ECG demonstrates sinus rhythm with right bundle branch block and left axis deviation   Assessment and  Plan  Hypertension  Well controlled  Orthostatic hypotension  Complete heart block stable post pacing  Pacemaker [employer]   The  patient's device was interrogated.  The information was reviewed. No changes were made in the programming.     Will have him take lisin 5 bid and check renal function in about two weeks   Normal 2014

## 2014-08-22 NOTE — Patient Instructions (Addendum)
Remote monitoring is used to monitor your Pacemaker of ICD from home. This monitoring reduces the number of office visits required to check your device to one time per year. It allows us to keep an eye on the functioning of your device to ensure it is working properly. You are scheduled for a device check from home on 11/22/14. You may send your transmission at any time that day. If you have a wireless device, the transmission will be sent automatically. After your physician reviews your transmission, you will receive a postcard with your next transmission date.   Your physician wants you to follow-up in: 1 year with Dr. Graciela HusbandsKlein. You will receive a reminder letter in the mail two months in advance. If you don't receive a letter, please call our office to schedule the follow-up appointment.   Your physician has recommended you make the following change in your medication:  Take Lisinopril 5 mg twice daily   Your physician recommends that you return for lab work in:  BMP in 2 weeks   Your physician wants you to follow-up in: 6 months with Dr. Mariah MillingGollan. You will receive a reminder letter in the mail two months in advance. If you don't receive a letter, please call our office to schedule the follow-up appointment.

## 2014-08-23 ENCOUNTER — Other Ambulatory Visit: Payer: Self-pay | Admitting: *Deleted

## 2014-08-23 MED ORDER — TAMSULOSIN HCL 0.4 MG PO CAPS: 0.4000 mg | ORAL_CAPSULE | Freq: Every day | ORAL | Status: DC

## 2014-08-28 ENCOUNTER — Ambulatory Visit: Payer: 59 | Admitting: Cardiovascular Disease

## 2014-09-05 ENCOUNTER — Other Ambulatory Visit (INDEPENDENT_AMBULATORY_CARE_PROVIDER_SITE_OTHER): Payer: Medicare Other

## 2014-09-05 DIAGNOSIS — I442 Atrioventricular block, complete: Secondary | ICD-10-CM

## 2014-09-06 LAB — BASIC METABOLIC PANEL
BUN/Creatinine Ratio: 14 (ref 10–22)
BUN: 15 mg/dL (ref 8–27)
CALCIUM: 9.3 mg/dL (ref 8.6–10.2)
CO2: 19 mmol/L (ref 18–29)
CREATININE: 1.05 mg/dL (ref 0.76–1.27)
Chloride: 99 mmol/L (ref 97–108)
GFR calc Af Amer: 74 mL/min/{1.73_m2} (ref >59)
GFR calc non Af Amer: 64 mL/min/{1.73_m2} (ref >59)
Glucose: 109 mg/dL — ABNORMAL HIGH (ref 65–99)
Potassium: 4.7 mmol/L (ref 3.5–5.2)
SODIUM: 138 mmol/L (ref 134–144)

## 2014-10-02 ENCOUNTER — Other Ambulatory Visit: Payer: Self-pay | Admitting: Cardiovascular Disease

## 2014-10-26 ENCOUNTER — Encounter (HOSPITAL_COMMUNITY): Payer: Self-pay | Admitting: Interventional Cardiology

## 2014-11-20 DIAGNOSIS — Z950 Presence of cardiac pacemaker: Secondary | ICD-10-CM | POA: Insufficient documentation

## 2014-11-20 DIAGNOSIS — Z8679 Personal history of other diseases of the circulatory system: Secondary | ICD-10-CM | POA: Insufficient documentation

## 2014-11-21 ENCOUNTER — Ambulatory Visit (INDEPENDENT_AMBULATORY_CARE_PROVIDER_SITE_OTHER): Payer: Medicare Other | Admitting: Cardiovascular Disease

## 2014-11-21 ENCOUNTER — Encounter: Payer: Self-pay | Admitting: Cardiovascular Disease

## 2014-11-21 VITALS — BP 120/58 | HR 63 | Ht 69.0 in | Wt 168.8 lb

## 2014-11-21 DIAGNOSIS — R0602 Shortness of breath: Secondary | ICD-10-CM

## 2014-11-21 DIAGNOSIS — I471 Supraventricular tachycardia, unspecified: Secondary | ICD-10-CM

## 2014-11-21 DIAGNOSIS — Z45018 Encounter for adjustment and management of other part of cardiac pacemaker: Secondary | ICD-10-CM

## 2014-11-21 DIAGNOSIS — J8410 Pulmonary fibrosis, unspecified: Secondary | ICD-10-CM

## 2014-11-21 DIAGNOSIS — R351 Nocturia: Secondary | ICD-10-CM

## 2014-11-21 DIAGNOSIS — I951 Orthostatic hypotension: Secondary | ICD-10-CM

## 2014-11-21 DIAGNOSIS — I10 Essential (primary) hypertension: Secondary | ICD-10-CM

## 2014-11-21 MED ORDER — DESMOPRESSIN ACETATE 0.1 MG PO TABS: 0.1000 mg | ORAL_TABLET | Freq: Every day | ORAL | Status: DC

## 2014-11-21 NOTE — Assessment & Plan Note (Signed)
Shortness of breath likely secondary to pulmonary fibrosis. He is concerned about heart failure but there is no clinical signs of this at this time

## 2014-11-21 NOTE — Assessment & Plan Note (Signed)
Likely has an appropriate overnight urination in terms of frequency and volume. After long discussion, on a trial basis we will start desmopressin 0.1 mg dose in the late evening. The hope would be to decrease his urine output overnight and avoid hypotension in the morning. Encouraged him to persist with his fluid intake in the morning when he wakes up.

## 2014-11-21 NOTE — Assessment & Plan Note (Signed)
Followed by Dr. Klein 

## 2014-11-21 NOTE — Assessment & Plan Note (Signed)
Continues to have labile but pressure, very low blood pressure in the morning after significant nocturia. I suspect he is having inappropriate nocturia with large volume diuresis. He is quite bothered by this and unable to function well in the morning. Small improvement with Gatorade but still symptomatic. Options include midodrine in the morning, also discussed with him starting desmopressin 1 dose before bedtime to decrease his nocturia. He prefers the desmopressin. We will start 0.1 mg daily at bedtime on a trial basis.

## 2014-11-21 NOTE — Patient Instructions (Addendum)
Please take the lisinopril before dinner (5 mg up to a 10 mg pill)  Start desmopressin one pill before bed (this will slow urination overnight)  Please call us if you have new issues that need to be addressed before your next appt.  Your physician wants you to follow-up in: 3 months.  You will receive a reminder letter in the mail two months in advance. If you don't receive a letter, please call our office to schedule the follow-up appointment.

## 2014-11-21 NOTE — Assessment & Plan Note (Signed)
Suggested he take his lisinopril and diltiazem before dinner to avoid hypotension in the morning. Currently he takes both in the morning

## 2014-11-21 NOTE — Assessment & Plan Note (Signed)
He reports slow worsening of his shortness of breath. Most notable with exertion, has to stop to catch his breath. Oxygen saturation is not performed on today's visit. If symptoms get worse, may need oxygen.

## 2014-11-21 NOTE — Progress Notes (Signed)
Patient ID: Walter PickettWillard L Townsend, male    DOB: 01-19-1925, 81 y.o.   MRN: [id number]  HPI Comments: 81 yo gentleman with a h/o of tachypalpitations, well controlled on diltiazem, status post pacemaker , hyperlipidemia, GERD, esophageal stricture, worsening SOB, dx with pulmonary fibrosis, presenting for followup for his nocturia and labile hypertension .  In follow-up today, he reports having very low blood pressures in the morning, sometimes 80 systolic, very high blood pressures after dinner, sometimes 200s systolic. He has significant nocturia, frequently has high-volume, 1/2 gallon or more. He urinates every 2 hours at night, uses bedside commode He is bothered by the high-volume at nighttime, minimal volume in the day Usually feels better after drinking Gatorade in the morning, blood pressure does not drop as low With any activity such as weed eating or garden work, blood pressure will usually drop very low  Blood pressure also low after eating He is concerned about CHF. Denies any lower extremity edema. Everybody in his family had CHF Some shortness of breath with exertion, typically blood pressure running low.  He feels that his pulmonate fibrosis is slowly getting worse Flomax recently held. He was previously taking this at night, now taking it in the morning, last dose was 48 hours ago  EKG on today's visit shows normal sinus rhythm with rate 63 bpm, right bundle branch block  Other past medical history  previously  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 He became exhausted, shortness of breath, could barely make it back into his house. He checked his blood pressure which was 95/55, heart rate 90. Uncertain if he had hypoxia. He reports it is taking in several days to recover. He was worried that his pacemaker was causing his heart rate to run high and wondered if he did something to hurt his pacemaker.   Typically he is very active   His knee is giving him problems,  pain  He only smoked for 4 years as a teenager but did have significant secondhand smoke. Previously seen by pulmonary for lung dz    No Known Allergies  Outpatient Encounter Prescriptions as of 11/21/2014  Medication Sig  . aspirin 81 MG EC tablet Take 81 mg by mouth at bedtime.   Marland Kitchen. atorvastatin (LIPITOR) 20 MG tablet Take 1 tablet (20 mg total) by mouth daily.  . Calcium Carbonate-Vitamin D (CALCIUM-VITAMIN D) 600-200 MG-UNIT CAPS Take 1 capsule by mouth daily.    . cyanocobalamin 100 MCG tablet Take 100 mcg by mouth daily.  Marland Kitchen. diltiazem (CARDIZEM CD) 120 MG 24 hr capsule TAKE 1 CAPSULE DAILY  . docusate sodium (COLACE) 100 MG capsule Take 100 mg by mouth at bedtime as needed for constipation.  . Garlic Oil 1000 MG CAPS Take 1 capsule by mouth daily.    Marland Kitchen. glucosamine-chondroitin 500-400 MG tablet Take 1 tablet by mouth daily.    . hydrocortisone (ANUSOL-HC) 2.5 % rectal cream Place 1 application rectally 3 (three) times daily.  . lansoprazole (PREVACID) 30 MG capsule Take 1 capsule (30 mg total) by mouth daily.  Marland Kitchen. lisinopril (PRINIVIL,ZESTRIL) 5 MG tablet Take 1 tablet (5 mg total) by mouth 2 (two) times daily.  . Multiple Vitamin (MULTIVITAMIN) tablet Take 1 tablet by mouth daily.    . saw palmetto 160 MG capsule Take 160 mg by mouth daily.    Marland Kitchen. desmopressin (DDAVP) 0.1 MG tablet Take 1 tablet (0.1 mg total) by mouth at bedtime.  . [DISCONTINUED] tamsulosin (FLOMAX) 0.4 MG CAPS capsule Take  1 capsule (0.4 mg total) by mouth daily. (Patient not taking: Reported on 11/21/2014)    Past Medical History  Diagnosis Date  . Rapid palpitations   . Hyperlipidemia   . Hypertension   . Pulmonary fibrosis   . Prostate enlargement   . Orthostatic hypotension 08/22/2014    Past Surgical History  Procedure Laterality Date  . Foot surgery    . Back surgery    . Hernia repair    . Pacemaker insertion  08/26/2013    St. Jude Assurity DDD pacemaker  . Insert / replace / remove pacemaker    .  Temporary pacemaker insertion N/A 08/25/2013    Procedure: TEMPORARY PACEMAKER INSERTION;  Surgeon: Lesleigh Noe, MD;  Location: [facility] CATH LAB;  Service: Cardiovascular;  Laterality: N/A;  . Permanent pacemaker insertion N/A 08/26/2013    Procedure: PERMANENT PACEMAKER INSERTION;  Surgeon: Marinus Maw, MD;  Location: [facility] CATH LAB;  Service: Cardiovascular;  Laterality: N/A;    Social History  reports that he quit smoking about 66 years ago. His smoking use included Cigarettes. He has a .9 pack-year smoking history. He has never used smokeless tobacco. He reports that he does not drink alcohol or use illicit drugs.  Family History family history includes Cancer in his brother; Coronary artery disease in his other; Heart attack in his brother, brother, brother, brother, and sister; Heart disease in his father and other; Heart failure in his brother.     Review of Systems  Respiratory: Positive for shortness of breath.   Cardiovascular: Negative.   Gastrointestinal: Negative.   Musculoskeletal: Negative.   Neurological: Positive for dizziness and weakness.  Hematological: Negative.   Psychiatric/Behavioral: Negative.   All other systems reviewed and are negative.   BP 120/58 mmHg  Pulse 63  Ht  (1.753 m)  Wt 168 lb 12 oz (76.544 kg)  BMI 24.91 kg/m2   Physical Exam  Constitutional: He is oriented to person, place, and time. He appears well-developed and well-nourished.  HENT:  Head: Normocephalic.  Nose: Nose normal.  Mouth/Throat: Oropharynx is clear and moist.  Eyes: Conjunctivae are normal. Pupils are equal, round, and reactive to light.  Neck: Normal range of motion. Neck supple. No JVD present.  Cardiovascular: Normal rate, regular rhythm, S1 normal, S2 normal, normal heart sounds and intact distal pulses.  Exam reveals no gallop and no friction rub.   No murmur heard. Pulmonary/Chest: Effort normal and breath sounds normal. No respiratory distress. He has no  wheezes. He has no rales. He exhibits no tenderness.  Abdominal: Soft. Bowel sounds are normal. He exhibits no distension. There is no tenderness.  Musculoskeletal: Normal range of motion. He exhibits no edema or tenderness.  Lymphadenopathy:    He has no cervical adenopathy.  Neurological: He is alert and oriented to person, place, and time. Coordination normal.  Skin: Skin is warm and dry. No rash noted. No erythema.  Psychiatric: He has a normal mood and affect. His behavior is normal. Judgment and thought content normal.      Assessment and Plan   Nursing note and vitals reviewed.

## 2014-11-22 ENCOUNTER — Ambulatory Visit (INDEPENDENT_AMBULATORY_CARE_PROVIDER_SITE_OTHER): Payer: Medicare Other | Admitting: *Deleted

## 2014-11-22 DIAGNOSIS — I442 Atrioventricular block, complete: Secondary | ICD-10-CM

## 2014-11-22 NOTE — Progress Notes (Signed)
Remote pacemaker transmission.   

## 2014-11-24 LAB — MDC_IDC_ENUM_SESS_TYPE_REMOTE
Battery Remaining Longevity: 114 mo
Battery Remaining Percentage: 94 %
Battery Voltage: 2.99 V
Brady Statistic AP VS Percent: 25 %
Brady Statistic AS VP Percent: 1 %
Brady Statistic AS VS Percent: 74 %
Brady Statistic RV Percent Paced: 1 %
Implantable Pulse Generator Model: 2240
Implantable Pulse Generator Serial Number: 2980518
Lead Channel Impedance Value: 390 Ohm
Lead Channel Impedance Value: 460 Ohm
Lead Channel Pacing Threshold Amplitude: 1 V
Lead Channel Pacing Threshold Pulse Width: 0.4 ms
Lead Channel Sensing Intrinsic Amplitude: 4.2 mV
Lead Channel Setting Pacing Amplitude: 1.25 V
Lead Channel Setting Pacing Amplitude: 2.5 V
MDC IDC MSMT LEADCHNL RA PACING THRESHOLD PULSEWIDTH: 0.6 ms
MDC IDC MSMT LEADCHNL RV PACING THRESHOLD AMPLITUDE: 1 V
MDC IDC MSMT LEADCHNL RV SENSING INTR AMPL: 2.4 mV
MDC IDC SESS DTM: 20160106070017
MDC IDC SET LEADCHNL RV PACING PULSEWIDTH: 0.4 ms
MDC IDC SET LEADCHNL RV SENSING SENSITIVITY: 1.5 mV
MDC IDC STAT BRADY AP VP PERCENT: 1 %
MDC IDC STAT BRADY RA PERCENT PACED: 25 %

## 2015-02-01 ENCOUNTER — Encounter: Payer: Self-pay | Admitting: Cardiology

## 2015-02-14 ENCOUNTER — Encounter: Payer: Self-pay | Admitting: Internal Medicine

## 2015-02-20 ENCOUNTER — Encounter: Payer: Self-pay | Admitting: Cardiovascular Disease

## 2015-02-20 ENCOUNTER — Ambulatory Visit (INDEPENDENT_AMBULATORY_CARE_PROVIDER_SITE_OTHER): Payer: Medicare Other | Admitting: Cardiovascular Disease

## 2015-02-20 VITALS — BP 150/68 | HR 65 | Ht 68.5 in | Wt 166.0 lb

## 2015-02-20 DIAGNOSIS — I951 Orthostatic hypotension: Secondary | ICD-10-CM | POA: Diagnosis not present

## 2015-02-20 DIAGNOSIS — I10 Essential (primary) hypertension: Secondary | ICD-10-CM

## 2015-02-20 DIAGNOSIS — I471 Supraventricular tachycardia: Secondary | ICD-10-CM | POA: Diagnosis not present

## 2015-02-20 DIAGNOSIS — E785 Hyperlipidemia, unspecified: Secondary | ICD-10-CM

## 2015-02-20 DIAGNOSIS — J8410 Pulmonary fibrosis, unspecified: Secondary | ICD-10-CM

## 2015-02-20 NOTE — Assessment & Plan Note (Signed)
Symptoms seem to have improved somewhat without his prostate medication, still has dramatic hypotension in the mornings but less symptomatic. Encouraged him to increase his fluid intake, could wear compression hose if needed. He reports having dizziness with desmopressin

## 2015-02-20 NOTE — Progress Notes (Signed)
Patient ID: Walter Townsend, male    DOB: August 06, 1925, 81 y.o.   MRN: [id number]  HPI Comments: 81 yo gentleman with a h/o of tachypalpitations, well controlled on diltiazem, status post pacemaker , hyperlipidemia, GERD, esophageal stricture, worsening SOB, dx with pulmonary fibrosis, presenting for followup for his nocturia and labile hypertension .  In follow-up today, he reports that he is feeling much better. He is no longer taking his prostate medication. This was contributing to his low blood pressures. He continues to have urination sometimes large volume overnight, typically every 2-3 hours. Occasional lightheadedness that might resolve with drinking Gatorade. Otherwise is active with no complaints. With activity in the garden, sometimes systolic pressure 105, 110. In general feels well No significant shortness of breath or chest pain No lower extremity edema. No weight change He is concerned about his pulmonary fibrosis, though denies any dramatic change in his breathing He does report that he did try desmopressin at nighttime for high volume polyuria/nocturia. This caused some dizziness He continues to have knee pain. Going to orthopedics today for a shot  EKG on today's visit shows normal sinus rhythm with rate 65 bpm, right bundle branch block  Other past medical history  previously  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 He became exhausted, shortness of breath, could barely make it back into his house. He checked his blood pressure which was 95/55, heart rate 90. His knee is giving him problems, pain. Sees orthopedics for shot Walter Townsend)  He only smoked for 4 years as a teenager but did have significant secondhand smoke. Previously seen by pulmonary for lung dz    No Known Allergies  Outpatient Encounter Prescriptions as of 02/20/2015  Medication Sig  . aspirin 81 MG EC tablet Take 81 mg by mouth at bedtime.   Marland Kitchen atorvastatin (LIPITOR) 20 MG tablet Take 1 tablet  (20 mg total) by mouth daily.  . Calcium Carbonate-Vitamin D (CALCIUM-VITAMIN D) 600-200 MG-UNIT CAPS Take 1 capsule by mouth daily.    . cyanocobalamin 100 MCG tablet Take 100 mcg by mouth daily.  Marland Kitchen diltiazem (CARDIZEM CD) 120 MG 24 hr capsule TAKE 1 CAPSULE DAILY  . docusate sodium (COLACE) 100 MG capsule Take 100 mg by mouth at bedtime as needed for constipation.  . Garlic Oil 1000 MG CAPS Take 1 capsule by mouth daily.    Marland Kitchen glucosamine-chondroitin 500-400 MG tablet Take 1 tablet by mouth daily.    . hydrocortisone (ANUSOL-HC) 2.5 % rectal cream Place 1 application rectally 3 (three) times daily.  . lansoprazole (PREVACID) 30 MG capsule Take 1 capsule (30 mg total) by mouth daily.  Marland Kitchen lisinopril (PRINIVIL,ZESTRIL) 5 MG tablet Take 1 tablet (5 mg total) by mouth 2 (two) times daily. (Patient taking differently: Take 2.5 mg by mouth 2 (two) times daily. )  . Multiple Vitamin (MULTIVITAMIN) tablet Take 1 tablet by mouth daily.    . saw palmetto 160 MG capsule Take 160 mg by mouth daily.    . [DISCONTINUED] desmopressin (DDAVP) 0.1 MG tablet Take 1 tablet (0.1 mg total) by mouth at bedtime. (Patient not taking: Reported on 02/20/2015)    Past Medical History  Diagnosis Date  . Rapid palpitations   . Hyperlipidemia   . Hypertension   . Pulmonary fibrosis   . Prostate enlargement   . Orthostatic hypotension 08/22/2014    Past Surgical History  Procedure Laterality Date  . Foot surgery    . Back surgery    . Hernia repair    .  Pacemaker insertion  08/26/2013    St. Jude Assurity DDD pacemaker  . Insert / replace / remove pacemaker    . Temporary pacemaker insertion N/A 08/25/2013    Procedure: TEMPORARY PACEMAKER INSERTION;  Surgeon: Lesleigh NoeHenry W Smith III, MD;  [facility];  Service: Cardiovascular;  Laterality: N/A;  . Permanent pacemaker insertion N/A 08/26/2013    Procedure: PERMANENT PACEMAKER INSERTION;  Surgeon: Marinus MawGregg W Taylor, MD;  [facility];  Service:  Cardiovascular;  Laterality: N/A;    Social History  reports that he quit smoking about 66 years ago. His smoking use included Cigarettes. He has a .9 pack-year smoking history. He has never used smokeless tobacco. He reports that he does not drink alcohol or use illicit drugs.  Family History family history includes Cancer in his brother; Coronary artery disease in his other; Heart attack in his brother, brother, brother, brother, and sister; Heart disease in his father and other; Heart failure in his brother.   Review of Systems  Respiratory: Positive for shortness of breath.   Cardiovascular: Negative.   Gastrointestinal: Negative.   Musculoskeletal: Negative.   Neurological: Negative.   Hematological: Negative.   Psychiatric/Behavioral: Negative.   All other systems reviewed and are negative.   BP 150/68 mmHg  Pulse 65  Ht 5' 8.5" (1.74 m)  Wt 166 lb (75.297 kg)  BMI 24.87 kg/m2   Physical Exam  Constitutional: He is oriented to person, place, and time. He appears well-developed and well-nourished.  HENT:  Head: Normocephalic.  Nose: Nose normal.  Mouth/Throat: Oropharynx is clear and moist.  Eyes: Conjunctivae are normal. Pupils are equal, round, and reactive to light.  Neck: Normal range of motion. Neck supple. No JVD present.  Cardiovascular: Normal rate, regular rhythm, S1 normal, S2 normal, normal heart sounds and intact distal pulses.  Exam reveals no gallop and no friction rub.   No murmur heard. Pulmonary/Chest: Effort normal and breath sounds normal. No respiratory distress. He has no wheezes. He has no rales. He exhibits no tenderness.  Abdominal: Soft. Bowel sounds are normal. He exhibits no distension. There is no tenderness.  Musculoskeletal: Normal range of motion. He exhibits no edema or tenderness.  Lymphadenopathy:    He has no cervical adenopathy.  Neurological: He is alert and oriented to person, place, and time. Coordination normal.  Skin: Skin is  warm and dry. No rash noted. No erythema.  Psychiatric: He has a normal mood and affect. His behavior is normal. Judgment and thought content normal.      Assessment and Plan   Nursing note and vitals reviewed.

## 2015-02-20 NOTE — Assessment & Plan Note (Signed)
Blood pressure is well controlled on today's visit. No changes made to the medications. 

## 2015-02-20 NOTE — Assessment & Plan Note (Signed)
Encouraged him to stay on his Lipitor. Most recent lipid panel not available through primary care

## 2015-02-20 NOTE — Assessment & Plan Note (Signed)
Managed by pulmonary. Symptoms seem relatively stable

## 2015-02-20 NOTE — Patient Instructions (Signed)
You are doing well. No medication changes were made.  Please call us if you have new issues that need to be addressed before your next appt.  Your physician wants you to follow-up in: 6 months.  You will receive a reminder letter in the mail two months in advance. If you don't receive a letter, please call our office to schedule the follow-up appointment.   

## 2015-02-26 ENCOUNTER — Ambulatory Visit (INDEPENDENT_AMBULATORY_CARE_PROVIDER_SITE_OTHER): Payer: Medicare Other | Admitting: *Deleted

## 2015-02-26 ENCOUNTER — Encounter: Payer: Self-pay | Admitting: Internal Medicine

## 2015-02-26 DIAGNOSIS — I442 Atrioventricular block, complete: Secondary | ICD-10-CM

## 2015-02-26 LAB — MDC_IDC_ENUM_SESS_TYPE_REMOTE
Brady Statistic AP VP Percent: 1.2 %
Brady Statistic AP VS Percent: 26 %
Brady Statistic AS VP Percent: 1 %
Brady Statistic AS VS Percent: 73 %
Brady Statistic RA Percent Paced: 27 %
Brady Statistic RV Percent Paced: 1.3 %
Date Time Interrogation Session: 20160411080016
Implantable Pulse Generator Model: 2240
Implantable Pulse Generator Serial Number: 2980518
Lead Channel Impedance Value: 390 Ohm
Lead Channel Impedance Value: 460 Ohm
Lead Channel Pacing Threshold Amplitude: 0.875 V
Lead Channel Sensing Intrinsic Amplitude: 2.3 mV
Lead Channel Sensing Intrinsic Amplitude: 4.2 mV
Lead Channel Setting Pacing Amplitude: 2.5 V
Lead Channel Setting Sensing Sensitivity: 1.5 mV
MDC IDC MSMT BATTERY REMAINING LONGEVITY: 112 mo
MDC IDC MSMT BATTERY REMAINING PERCENTAGE: 92 %
MDC IDC MSMT BATTERY VOLTAGE: 2.99 V
MDC IDC MSMT LEADCHNL RV PACING THRESHOLD PULSEWIDTH: 0.4 ms
MDC IDC SET LEADCHNL RV PACING AMPLITUDE: 1.125
MDC IDC SET LEADCHNL RV PACING PULSEWIDTH: 0.4 ms

## 2015-02-26 NOTE — Progress Notes (Signed)
Remote pacemaker transmission.   

## 2015-03-27 ENCOUNTER — Encounter: Payer: Self-pay | Admitting: Cardiology

## 2015-05-18 DIAGNOSIS — N320 Bladder-neck obstruction: Secondary | ICD-10-CM | POA: Insufficient documentation

## 2015-05-29 ENCOUNTER — Ambulatory Visit (INDEPENDENT_AMBULATORY_CARE_PROVIDER_SITE_OTHER): Payer: Medicare Other | Admitting: *Deleted

## 2015-05-29 ENCOUNTER — Encounter: Payer: Self-pay | Admitting: Internal Medicine

## 2015-05-29 DIAGNOSIS — I442 Atrioventricular block, complete: Secondary | ICD-10-CM | POA: Diagnosis not present

## 2015-05-29 NOTE — Progress Notes (Signed)
Remote pacemaker transmission.   

## 2015-05-31 LAB — CUP PACEART REMOTE DEVICE CHECK
Battery Remaining Percentage: 95.5 %
Brady Statistic AS VP Percent: 1 %
Brady Statistic AS VS Percent: 71 %
Brady Statistic RA Percent Paced: 29 %
Brady Statistic RV Percent Paced: 1.1 %
Date Time Interrogation Session: 20160712060016
Lead Channel Impedance Value: 390 Ohm
Lead Channel Impedance Value: 450 Ohm
Lead Channel Pacing Threshold Amplitude: 1.125 V
Lead Channel Setting Pacing Pulse Width: 0.4 ms
Lead Channel Setting Sensing Sensitivity: 1.5 mV
MDC IDC MSMT BATTERY REMAINING LONGEVITY: 121 mo
MDC IDC MSMT BATTERY VOLTAGE: 2.99 V
MDC IDC MSMT LEADCHNL RA SENSING INTR AMPL: 4.5 mV
MDC IDC MSMT LEADCHNL RV PACING THRESHOLD PULSEWIDTH: 0.4 ms
MDC IDC MSMT LEADCHNL RV SENSING INTR AMPL: 2.5 mV
MDC IDC PG SERIAL: 2980518
MDC IDC SET LEADCHNL RA PACING AMPLITUDE: 2.5 V
MDC IDC SET LEADCHNL RV PACING AMPLITUDE: 1.375
MDC IDC STAT BRADY AP VP PERCENT: 1.1 %
MDC IDC STAT BRADY AP VS PERCENT: 28 %
Pulse Gen Model: 2240

## 2015-06-18 ENCOUNTER — Encounter: Payer: Self-pay | Admitting: Internal Medicine

## 2015-06-22 ENCOUNTER — Encounter: Payer: Self-pay | Admitting: Cardiology

## 2015-09-04 ENCOUNTER — Other Ambulatory Visit: Payer: Self-pay | Admitting: *Deleted

## 2015-09-04 ENCOUNTER — Ambulatory Visit: Payer: Medicare Other | Admitting: Cardiovascular Disease

## 2015-09-04 ENCOUNTER — Telehealth: Payer: Self-pay | Admitting: Cardiovascular Disease

## 2015-09-04 MED ORDER — DILTIAZEM HCL ER COATED BEADS 120 MG PO CP24: 120.0000 mg | ORAL_CAPSULE | Freq: Every day | ORAL | Status: DC

## 2015-09-04 MED ORDER — ATORVASTATIN CALCIUM 20 MG PO TABS: 20.0000 mg | ORAL_TABLET | Freq: Every day | ORAL | Status: DC

## 2015-09-04 MED ORDER — LISINOPRIL 5 MG PO TABS: 5.0000 mg | ORAL_TABLET | Freq: Two times a day (BID) | ORAL | Status: DC

## 2015-09-04 NOTE — Telephone Encounter (Signed)
° °  1. Which medications need to be refilled? Diltiazem 120 mg po daily ; Atorvastatin 20 mg po daily ; Lansoprazole 30 mg po daily ; Tramsulosin 0.4 mg po daily ; Lisinopril 5 mg po daily ; hydrocortisone 2.5 % PR PRN   2. Which pharmacy/location is medication to be sent to?  [facility]  3. Do they need a 30 day or 90 day supply?  90    Patient wants to get all rx on same refill/mailing/order schedule

## 2015-09-04 NOTE — Telephone Encounter (Signed)
Pt will have to contact PCP for Hydrocortisone, Tramsulosin and lansoprazole Refill.  I have refilled all cardiac medications request today [facility].

## 2015-09-07 ENCOUNTER — Ambulatory Visit: Payer: Medicare Other

## 2015-09-07 DIAGNOSIS — Z23 Encounter for immunization: Secondary | ICD-10-CM

## 2015-09-11 ENCOUNTER — Encounter: Payer: Self-pay | Admitting: Internal Medicine

## 2015-09-11 ENCOUNTER — Ambulatory Visit (INDEPENDENT_AMBULATORY_CARE_PROVIDER_SITE_OTHER): Payer: Medicare Other | Admitting: Internal Medicine

## 2015-09-11 VITALS — BP 160/80 | HR 68 | Ht 69.0 in | Wt 166.0 lb

## 2015-09-11 DIAGNOSIS — I442 Atrioventricular block, complete: Secondary | ICD-10-CM | POA: Diagnosis not present

## 2015-09-11 DIAGNOSIS — Z45018 Encounter for adjustment and management of other part of cardiac pacemaker: Secondary | ICD-10-CM | POA: Diagnosis not present

## 2015-09-11 LAB — CUP PACEART INCLINIC DEVICE CHECK
Battery Voltage: 2.99 V
Date Time Interrogation Session: 20161025123856
Implantable Lead Implant Date: 20141010
Lead Channel Impedance Value: 380 Ohm
Lead Channel Pacing Threshold Amplitude: 0.875 V
Lead Channel Pacing Threshold Pulse Width: 0.4 ms
Lead Channel Sensing Intrinsic Amplitude: 2.3 mV
Lead Channel Sensing Intrinsic Amplitude: 4.4 mV
Lead Channel Setting Pacing Amplitude: 1.25 V
Lead Channel Setting Pacing Pulse Width: 0.4 ms
Lead Channel Setting Sensing Sensitivity: 1.5 mV
MDC IDC LEAD IMPLANT DT: 20141010
MDC IDC LEAD LOCATION: 753859
MDC IDC LEAD LOCATION: 753860
MDC IDC MSMT LEADCHNL RA IMPEDANCE VALUE: 450 Ohm
MDC IDC MSMT LEADCHNL RA PACING THRESHOLD AMPLITUDE: 0.75 V
MDC IDC MSMT LEADCHNL RA PACING THRESHOLD PULSEWIDTH: 0.6 ms
MDC IDC PG SERIAL: 2980518
MDC IDC SET LEADCHNL RA PACING AMPLITUDE: 2.5 V
MDC IDC STAT BRADY RA PERCENT PACED: 28 %
MDC IDC STAT BRADY RV PERCENT PACED: 1 % — AB
Pulse Gen Model: 2240

## 2015-09-11 NOTE — Patient Instructions (Signed)
Medication Instructions: 1) Take diltiazem (cardiazem) at bedtime 2) Take lisinopril 2.5 mg once every morning  Labwork: - none  Procedures/Testing: - none  Follow-Up: - Remote monitoring is used to monitor your Pacemaker of ICD from home. This monitoring reduces the number of office visits required to check your device to one time per year. It allows us to keep an eye on the functioning of your device to ensure it is working properly. You are scheduled for a device check from home on 12/11/15. You may send your transmission at any time that day. If you have a wireless device, the transmission will be sent automatically. After your physician reviews your transmission, you will receive a postcard with your next transmission date.  - Your physician wants you to follow-up in: 1 year with Dr. Graciela HusbandsKlein. You will receive a reminder letter in the mail two months in advance. If you don't receive a letter, please call our office to schedule the follow-up appointment.  Any Additional Special Instructions Will Be Listed Below (If Applicable).  s

## 2015-09-11 NOTE — Progress Notes (Signed)
Patient Care Team: Rafael BihariJohn B Walker III, MD as PCP - General (Unknown Physician Specialty)   HPI  Walter PickettWillard L Townsend is a 81 y.o. male Seen following pacemaker implantation 10/14 for symptomatic high-grade heart block . Echo at that time demonstrated normal left ventricular function  He has had no more slow heartbeats. Exercise tolerance is stable  e does note however that with strenuous exercise his blood pressure goes low.  He has tried to manipulate his blood pressure medications in the past with variable results. We've tried taking the diltiazem at night and in the morning. Occasionally, he still has episodes with lower blood pressures. For a while, his blood pressure is doing better on twice a day lisinopril. On his own, over recent months, he has been taking it every other day. At half dose.  He denies chest pain or shortness of breath.  Outside records reviewed      Past Medical History  Diagnosis Date  . Rapid palpitations   . Hyperlipidemia   . Hypertension   . Pulmonary fibrosis (HCC)   . Prostate enlargement   . Orthostatic hypotension 08/22/2014    Past Surgical History  Procedure Laterality Date  . Foot surgery    . Back surgery    . Hernia repair    . Pacemaker insertion  08/26/2013    St. Jude Assurity DDD pacemaker  . Insert / replace / remove pacemaker    . Temporary pacemaker insertion N/A 08/25/2013    Procedure: TEMPORARY PACEMAKER INSERTION;  Surgeon: Lesleigh NoeHenry W Smith III, MD;  [facility];  Service: Cardiovascular;  Laterality: N/A;  . Permanent pacemaker insertion N/A 08/26/2013    Procedure: PERMANENT PACEMAKER INSERTION;  Surgeon: Marinus MawGregg W Taylor, MD;  [facility];  Service: Cardiovascular;  Laterality: N/A;    Current Outpatient Prescriptions  Medication Sig Dispense Refill  . aspirin 81 MG EC tablet Take 81 mg by mouth at bedtime.     Marland Kitchen. atorvastatin (LIPITOR) 20 MG tablet Take 1 tablet (20 mg total) by mouth daily. 90 tablet  3  . Calcium Carbonate-Vitamin D (CALCIUM-VITAMIN D) 600-200 MG-UNIT CAPS Take 1 capsule by mouth daily.      . cyanocobalamin 100 MCG tablet Take 100 mcg by mouth daily.    Marland Kitchen. diltiazem (CARDIZEM CD) 120 MG 24 hr capsule Take 1 capsule (120 mg total) by mouth daily. 90 capsule 3  . docusate sodium (COLACE) 100 MG capsule Take 100 mg by mouth at bedtime as needed for constipation.    . finasteride (PROSCAR) 5 MG tablet Take one tablet by mouth every four days    . Garlic Oil 1000 MG CAPS Take 1 capsule by mouth daily.      Marland Kitchen. glucosamine-chondroitin 500-400 MG tablet Take 1 tablet by mouth daily.      . hydrocortisone (ANUSOL-HC) 2.5 % rectal cream Place 1 application rectally 3 (three) times daily. 30 g 1  . lansoprazole (PREVACID) 30 MG capsule Take 1 capsule (30 mg total) by mouth daily. 90 capsule 3  . lisinopril (PRINIVIL,ZESTRIL) 5 MG tablet Take 5 mg by mouth daily.    . Multiple Vitamin (MULTIVITAMIN) tablet Take 1 tablet by mouth daily.      . saw palmetto 160 MG capsule Take 160 mg by mouth daily.       No current facility-administered medications for this visit.    No Known Allergies  Review of Systems negative except from HPI and PMH  Physical  Exam BP 160/80 mmHg  Pulse 68  Ht  (1.753 m)  Wt 166 lb (75.297 kg)  BMI 24.50 kg/m2 Well developed and nourished in no acute distress HENT normal Neck supple with JVP-flat Clear Regular rate and rhythm, no murmurs or gallops Abd-soft with active BS No Clubbing cyanosis edema Skin-warm and dry A & Oriented  Grossly normal sensory and motor function  ECG demonstrates sinus rhythm with right bundle branch block and left axis deviation Intervals 17/15/42   Assessment and  Plan  Hypertension  Well controlled  Orthostatic hypotension  Complete heart block -intermittent  stable post pacing  Sinus bradycardia  Pacemaker St Jude   The patient's device was interrogated.  The information was reviewed. No changes were made  in the programming.     His blood pressure is elevated today on his every other day lisinopril half dose regime. We will start by having him increase his lisinopril to 2.5 mg every day. We will have him take his diltiazem at night.

## 2015-09-27 ENCOUNTER — Other Ambulatory Visit: Payer: Self-pay | Admitting: *Deleted

## 2015-09-27 ENCOUNTER — Telehealth: Payer: Self-pay | Admitting: *Deleted

## 2015-09-27 MED ORDER — LANSOPRAZOLE 30 MG PO CPDR
30.0000 mg | DELAYED_RELEASE_CAPSULE | Freq: Every day | ORAL | Status: DC
Start: 2015-09-27 — End: 2016-10-07

## 2015-09-27 NOTE — Telephone Encounter (Signed)
*  STAT* If patient is at the pharmacy, call can be transferred to refill team.   1. Which medications need to be refilled? (please list name of each medication and dose if known) Lansoprazole   2. Which pharmacy/location (including street and city if local pharmacy) is medication to be sent to? [facility]   3. Do they need a 30 day or 90 day supply? 90 day

## 2015-10-02 ENCOUNTER — Encounter: Payer: Self-pay | Admitting: Internal Medicine

## 2015-10-30 ENCOUNTER — Ambulatory Visit (INDEPENDENT_AMBULATORY_CARE_PROVIDER_SITE_OTHER): Payer: Medicare Other | Admitting: Cardiovascular Disease

## 2015-10-30 ENCOUNTER — Encounter: Payer: Self-pay | Admitting: Cardiovascular Disease

## 2015-10-30 VITALS — BP 140/80 | HR 60 | Ht 69.0 in | Wt 167.1 lb

## 2015-10-30 DIAGNOSIS — I951 Orthostatic hypotension: Secondary | ICD-10-CM

## 2015-10-30 DIAGNOSIS — E785 Hyperlipidemia, unspecified: Secondary | ICD-10-CM

## 2015-10-30 DIAGNOSIS — I10 Essential (primary) hypertension: Secondary | ICD-10-CM

## 2015-10-30 DIAGNOSIS — J8410 Pulmonary fibrosis, unspecified: Secondary | ICD-10-CM

## 2015-10-30 DIAGNOSIS — I471 Supraventricular tachycardia: Secondary | ICD-10-CM

## 2015-10-30 DIAGNOSIS — R351 Nocturia: Secondary | ICD-10-CM

## 2015-10-30 DIAGNOSIS — Z45018 Encounter for adjustment and management of other part of cardiac pacemaker: Secondary | ICD-10-CM

## 2015-10-30 MED ORDER — TAMSULOSIN HCL 0.4 MG PO CAPS: 0.4000 mg | ORAL_CAPSULE | Freq: Every day | ORAL | Status: DC

## 2015-10-30 NOTE — Patient Instructions (Addendum)
You are doing well. No medication changes were made.  If blood pressure drops, Drink drinks, lay down  Please call us if you have new issues that need to be addressed before your next appt.  Your physician wants you to follow-up in: 6 months.  You will receive a reminder letter in the mail two months in advance. If you don't receive a letter, please call our office to schedule the follow-up appointment.

## 2015-10-30 NOTE — Assessment & Plan Note (Signed)
Followed by Dr. Graciela HusbandsKlein Patient does report appreciating paced beats

## 2015-10-30 NOTE — Assessment & Plan Note (Signed)
He is taking diltiazem every other day Suggested he try lower dose of the 30 mg pills, possibly 30 mg twice a day He does not want to change as he reports the 30 mg pills dropped his blood pressure We'll continue his current regiment as he is taking as he reports he is asymptomatic

## 2015-10-30 NOTE — Assessment & Plan Note (Signed)
Encouraged him to stay on his Lipitor 20 mg daily.

## 2015-10-30 NOTE — Assessment & Plan Note (Signed)
He is alternating tamsulosin with diltiazem. Seems to be relatively asymptomatic, occasionally drops his pressure which resolves with sitting, fluids

## 2015-10-30 NOTE — Assessment & Plan Note (Signed)
Continues to have significant nocturia, likely contributing to his morning hypotension. He is low to drink fluids first thing in the morning

## 2015-10-30 NOTE — Progress Notes (Signed)
Patient ID: Walter Townsend, male    DOB: Aug 16, 1925, 81 y.o.   MRN: [id number]  HPI Comments: 81 yo gentleman with a h/o of tachypalpitations, well controlled on diltiazem, status post pacemaker , hyperlipidemia, GERD, esophageal stricture, worsening SOB, dx with pulmonary fibrosis, presenting for followup for his nocturia and labile hypertension .  In follow-up today, He reports that he is doing well He has changed several of his medications He takes tamsulosin every other day, in the morning Alternate days he takes diltiazem 120 mg QOD Does not take lisinopril on a regular basis Continues to have significant nocturia, "1/2 gallon of the time" Hypotensive in the morning Blood pressure today at home was 115/72, blood pressure on arrival today 160 systolic, 140 on recheck He is requesting refill of tamsulosin as "you started it". 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 He became exhausted, shortness of breath, could barely make it back into his house. He checked his blood pressure which was 95/55, heart rate 90. His knee is giving him problems, pain. Sees orthopedics for shot Walter Townsend([facility])  He only smoked for 4 years as a teenager but did have significant secondhand smoke. Previously seen by pulmonary for lung dz    No Known Allergies  Outpatient Encounter Prescriptions as of 10/30/2015  Medication Sig  . aspirin 81 MG EC tablet Take 81 mg by mouth at bedtime.   Marland Kitchen. atorvastatin (LIPITOR) 20 MG tablet Take 1 tablet (20 mg total) by mouth daily.  . Calcium Carbonate-Vitamin D (CALCIUM-VITAMIN D) 600-200 MG-UNIT CAPS Take 1 capsule by mouth daily.    . cyanocobalamin 100 MCG tablet Take 100  mcg by mouth daily.  Marland Kitchen. diltiazem (CARDIZEM CD) 120 MG 24 hr capsule Take 1 capsule (120 mg total) by mouth daily.  Marland Kitchen. docusate sodium (COLACE) 100 MG capsule Take 100 mg by mouth at bedtime as needed for constipation.  . Garlic Oil 1000 MG CAPS Take 1 capsule by mouth daily.    Marland Kitchen. glucosamine-chondroitin 500-400 MG tablet Take 1 tablet by mouth daily.    . hydrocortisone (ANUSOL-HC) 2.5 % rectal cream Place 1 application rectally 3 (three) times daily.  . lansoprazole (PREVACID) 30 MG capsule Take 1 capsule (30 mg total) by mouth daily.  Marland Kitchen. lisinopril (ZESTRIL) 2.5 MG tablet Take 1 tablet (2.5 mg total) by mouth daily.  . Multiple Vitamin (MULTIVITAMIN) tablet Take 1 tablet by mouth daily.    . saw palmetto 160 MG capsule Take 160 mg by mouth daily.    . tamsulosin (FLOMAX) 0.4 MG CAPS capsule Take 1 capsule (0.4 mg total) by mouth daily.  . vitamin E 1000 UNIT capsule Take 1,000 Units by mouth 3 (three) times a week.  . [DISCONTINUED] tamsulosin (FLOMAX) 0.4 MG CAPS capsule Take 0.4 mg by mouth daily.  . [DISCONTINUED] finasteride (PROSCAR) 5 MG tablet Take one tablet by mouth every four days   No facility-administered encounter medications on file as of 10/30/2015.    Past Medical History  Diagnosis Date  . Rapid palpitations   . Hyperlipidemia   . Hypertension   . Pulmonary fibrosis (HCC)   . Prostate enlargement   . Orthostatic hypotension 08/22/2014    Past  Surgical History  Procedure Laterality Date  . Foot surgery    . Back surgery    . Hernia repair    . Pacemaker insertion  08/26/2013    St. Jude Assurity DDD pacemaker  . Insert / replace / remove pacemaker    . Temporary pacemaker insertion N/A 08/25/2013    Procedure: TEMPORARY PACEMAKER INSERTION;  Surgeon: Lesleigh Noe, MD;  [facility] C[facility] LAB;  Service: Cardiovascular;  Laterality: N/A;  . Permanent pacemaker insertion N/A 08/26/2013    Procedure: PERMANENT PACEMAKER INSERTION;  Surgeon: Marinus Maw, MD;   Location: [facility] CATH LAB;  Service: Cardiovascular;  Laterality: N/A;    Social History  reports that he quit smoking about 66 years ago. His smoking use included Cigarettes. He has a .9 pack-year smoking history. He has never used smokeless tobacco. He reports that he does not drink alcohol or use illicit drugs.  Family History family history includes Cancer in his brother; Coronary artery disease in his other; Heart attack in his brother, brother, brother, brother, and sister; Heart disease in his father and other; Heart failure in his brother.   Review of Systems  Respiratory: Positive for shortness of breath.   Cardiovascular: Negative.   Gastrointestinal: Negative.   Musculoskeletal: Negative.   Neurological: Negative.   Hematological: Negative.   Psychiatric/Behavioral: Negative.   All other systems reviewed and are negative.   BP 140/80 mmHg  Pulse 60  Ht  (1.753 m)  Wt 167 lb 1.9 oz (75.805 kg)  BMI 24.67 kg/m2   Physical Exam  Constitutional: He is oriented to person, place, and time. He appears well-developed and well-nourished.  HENT:  Head: Normocephalic.  Nose: Nose normal.  Mouth/Throat: Oropharynx is clear and moist.  Eyes: Conjunctivae are normal. Pupils are equal, round, and reactive to light.  Neck: Normal range of motion. Neck supple. No JVD present.  Cardiovascular: Normal rate, regular rhythm, S1 normal, S2 normal, normal heart sounds and intact distal pulses.  Exam reveals no gallop and no friction rub.   No murmur heard. Pulmonary/Chest: Effort normal and breath sounds normal. No respiratory distress. He has no wheezes. He has no rales. He exhibits no tenderness.  Abdominal: Soft. Bowel sounds are normal. He exhibits no distension. There is no tenderness.  Musculoskeletal: Normal range of motion. He exhibits no edema or tenderness.  Lymphadenopathy:    He has no cervical adenopathy.  Neurological: He is alert and oriented to person, place, and time.  Coordination normal.  Skin: Skin is warm and dry. No rash noted. No erythema.  Psychiatric: He has a normal mood and affect. His behavior is normal. Judgment and thought content normal.      Assessment and Plan   Nursing note and vitals reviewed.

## 2015-10-30 NOTE — Assessment & Plan Note (Signed)
Crackles at the bases bilaterally, reports stable chronic shortness of breath

## 2015-10-30 NOTE — Assessment & Plan Note (Signed)
He monitors his blood pressure at home, reports well-controlled pressures on his current regiment Not taking lisinopril on a regular basis

## 2015-11-20 ENCOUNTER — Ambulatory Visit: Payer: Medicare Other | Admitting: Cardiovascular Disease

## 2015-12-11 ENCOUNTER — Ambulatory Visit (INDEPENDENT_AMBULATORY_CARE_PROVIDER_SITE_OTHER): Payer: Medicare Other | Admitting: *Deleted

## 2015-12-11 DIAGNOSIS — I442 Atrioventricular block, complete: Secondary | ICD-10-CM

## 2015-12-12 NOTE — Progress Notes (Signed)
Remote pacemaker transmission.   

## 2015-12-26 LAB — CUP PACEART REMOTE DEVICE CHECK
Battery Remaining Longevity: 126 mo
Battery Remaining Percentage: 95.5 %
Battery Voltage: 2.99 V
Brady Statistic AP VP Percent: 1 %
Brady Statistic AP VS Percent: 22 %
Brady Statistic RA Percent Paced: 22 %
Brady Statistic RV Percent Paced: 1 %
Implantable Lead Location: 753860
Lead Channel Impedance Value: 360 Ohm
Lead Channel Impedance Value: 450 Ohm
Lead Channel Pacing Threshold Amplitude: 0.75 V
Lead Channel Pacing Threshold Pulse Width: 0.6 ms
Lead Channel Sensing Intrinsic Amplitude: 4.1 mV
Lead Channel Setting Sensing Sensitivity: 1 mV
MDC IDC LEAD IMPLANT DT: 20141010
MDC IDC LEAD IMPLANT DT: 20141010
MDC IDC LEAD LOCATION: 753859
MDC IDC MSMT LEADCHNL RV PACING THRESHOLD AMPLITUDE: 1.125 V
MDC IDC MSMT LEADCHNL RV PACING THRESHOLD PULSEWIDTH: 0.4 ms
MDC IDC MSMT LEADCHNL RV SENSING INTR AMPL: 2.4 mV
MDC IDC PG SERIAL: 2980518
MDC IDC SESS DTM: 20170124070012
MDC IDC SET LEADCHNL RA PACING AMPLITUDE: 2 V
MDC IDC SET LEADCHNL RV PACING AMPLITUDE: 1.375
MDC IDC SET LEADCHNL RV PACING PULSEWIDTH: 0.4 ms
MDC IDC STAT BRADY AS VP PERCENT: 1 %
MDC IDC STAT BRADY AS VS PERCENT: 78 %

## 2015-12-28 ENCOUNTER — Encounter: Payer: Self-pay | Admitting: Cardiology

## 2016-01-24 ENCOUNTER — Encounter: Payer: Self-pay | Admitting: *Deleted

## 2016-02-05 ENCOUNTER — Telehealth: Payer: Self-pay | Admitting: Cardiovascular Disease

## 2016-02-05 NOTE — Telephone Encounter (Signed)
Pt c/o Shortness Of Breath: STAT if SOB developed within the last 24 hours or pt is noticeably SOB on the phone  1. Are you currently SOB (can you hear that pt is SOB on the phone)? no  2. How long have you been experiencing SOB? Middle of night had sob and chest pains while laying in bed.  3. Are you SOB when sitting or when up moving around? Felt better when sitting up.  4. Are you currently experiencing any other symptoms? no

## 2016-02-05 NOTE — Telephone Encounter (Signed)
Spoke w/ pt.  He reports that last night when he went to bed, he developed chest pain & SOB that was resolved on sitting up. He took 2 aspirin & 1 lasix 20 mg (from an old rx) and went to the living room to sleep in the recliner.   [facility]. Pt reports daily wts, but reviewed instructions, as he has been weighing day and night.  Pt states that he drinks lots of water, had [place] bacon, eggs & gravy for breakfast.  He is concerned that he is urinating so much, he measures about a 1/2 gallon each night. Pt is worried about reducing his fluid intake 2/2 low BP. He reports soreness in his left nipple, he put abx ointment on it and is having improvement.  Pt is not currently symptomatic, feeling much better after taking lasix.  He would like to come in for evaluation and have Dr. Mariah MillingGollan take a look at him.  Pt placed on waiting list in the event of a cancellation, as Dr. Windell HummingbirdGollan's next available is in May.

## 2016-02-06 ENCOUNTER — Encounter: Payer: Self-pay | Admitting: Cardiovascular Disease

## 2016-02-06 ENCOUNTER — Ambulatory Visit (INDEPENDENT_AMBULATORY_CARE_PROVIDER_SITE_OTHER): Payer: Medicare Other | Admitting: Cardiovascular Disease

## 2016-02-06 VITALS — BP 140/74 | HR 63 | Resp 16 | Ht 69.0 in | Wt 165.5 lb

## 2016-02-06 DIAGNOSIS — R0602 Shortness of breath: Secondary | ICD-10-CM | POA: Diagnosis not present

## 2016-02-06 DIAGNOSIS — I471 Supraventricular tachycardia: Secondary | ICD-10-CM

## 2016-02-06 DIAGNOSIS — R079 Chest pain, unspecified: Secondary | ICD-10-CM

## 2016-02-06 DIAGNOSIS — I951 Orthostatic hypotension: Secondary | ICD-10-CM

## 2016-02-06 DIAGNOSIS — I10 Essential (primary) hypertension: Secondary | ICD-10-CM

## 2016-02-06 NOTE — Assessment & Plan Note (Signed)
He denies any tachycardia concerning for arrhythmia

## 2016-02-06 NOTE — Patient Instructions (Addendum)
You are doing well. No medication changes were made.  Please move the diltiazem to the late afternoon or evening  For chest pain, try a ibuprofen or aleve as needed Hot pack  Please call us if you have new issues that need to be addressed before your next appt.  Your physician wants you to follow-up in: 6 months.  You will receive a reminder letter in the mail two months in advance. If you don't receive a letter, please call our office to schedule the follow-up appointment.

## 2016-02-06 NOTE — Assessment & Plan Note (Signed)
Blood pressure well controlled on today's visit but does run low If he continues to have dizziness/orthostasis, may need to stop his prostate medication or decrease the diltiazem dose

## 2016-02-06 NOTE — Assessment & Plan Note (Signed)
Recommended he take the diltiazem in the evenings, not in the mornings Increase his fluid intake especially in the morning Symptoms possibly exacerbated by nocturia, possibly inappropriate volume

## 2016-02-06 NOTE — Assessment & Plan Note (Signed)
Atypical symptoms, likely musculoskeletal, relieved by sitting up Worse with deep inspiration, possibly pleuritic Suggested hot compresses, NSAIDs if symptoms recur

## 2016-02-06 NOTE — Progress Notes (Addendum)
Patient ID: Walter PickettWillard L Ivie, male    DOB: 08-Dec-1924, 82 y.o.   MRN: [id number]  HPI Comments: 82 yo gentleman with a h/o of tachypalpitations, well controlled on diltiazem, status post pacemaker , hyperlipidemia, GERD, esophageal stricture, worsening SOB, dx with pulmonary fibrosis, presenting for followup for his nocturia and labile hypertension .  In follow-up today, he reports having chest pain several days ago while sleeping, also associated with shortness of breath. Symptoms were better when he was sitting up, worse when supine. He took Lasix, aspirin, set in his recliner for several hours, symptoms resolved on their own. 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 He became exhausted, shortness of breath, could barely make it back into his house. He checked his blood pressure which was 95/55, heart rate 90. His knee is giving him problems, pain. Sees orthopedics for shot [name]([facility])  He only smoked for 4 years as a teenager but did have significant secondhand smoke. Previously seen by pulmonary for lung dz    No Known Allergies  Outpatient Encounter Prescriptions as of 02/06/2016  Medication Sig  . aspirin 81 MG EC tablet Take 81 mg by mouth at bedtime.   Marland Kitchen. atorvastatin (LIPITOR) 20 MG tablet Take 1 tablet (20 mg total) by mouth daily.  . Calcium Carbonate-Vitamin D (CALCIUM-VITAMIN D) 600-200 MG-UNIT CAPS Take 1 capsule by mouth daily.    . cyanocobalamin 100 MCG tablet Take  100 mcg by mouth daily.  Marland Kitchen. diltiazem (CARDIZEM CD) 120 MG 24 hr capsule Take 1 capsule (120 mg total) by mouth daily.  Marland Kitchen. docusate sodium (COLACE) 100 MG capsule Take 100 mg by mouth at bedtime as needed for constipation.  . Garlic Oil 1000 MG CAPS Take 1 capsule by mouth daily.    Marland Kitchen. glucosamine-chondroitin 500-400 MG tablet Take 1 tablet by mouth daily.    . hydrocortisone (ANUSOL-HC) 2.5 % rectal cream Place 1 application rectally 3 (three) times daily.  . lansoprazole (PREVACID) 30 MG capsule Take 1 capsule (30 mg total) by mouth daily.  Marland Kitchen. lisinopril (ZESTRIL) 2.5 MG tablet Take 1 tablet (2.5 mg total) by mouth daily.  . Multiple Vitamin (MULTIVITAMIN) tablet Take 1 tablet by mouth daily.    . saw palmetto 160 MG capsule Take 160 mg by mouth daily.    . tamsulosin (FLOMAX) 0.4 MG CAPS capsule Take 1 capsule (0.4 mg total) by mouth daily.  . vitamin E 1000 UNIT capsule Take 1,000 Units by mouth 3 (three) times a week.   No facility-administered encounter medications on file as of 02/06/2016.    Past Medical History  Diagnosis Date  . Rapid palpitations   . Hyperlipidemia   . Hypertension   . Pulmonary fibrosis (HCC)   . Prostate enlargement   . Orthostatic hypotension 08/22/2014    Past Surgical History  Procedure Laterality Date  . Foot surgery    . Back surgery    . Hernia repair    . Pacemaker insertion  08/26/2013    St. Jude  Assurity DDD pacemaker  . Insert / replace / remove pacemaker    . Temporary pacemaker insertion N/A 08/25/2013    Procedure: TEMPORARY PACEMAKER INSERTION;  Surgeon: Lesleigh Noe, MD;  Location: [facility] C[facility] LAB;  Service: Cardiovascular;  Laterality: N/A;  . Permanent pacemaker insertion N/A 08/26/2013    Procedure: PERMANENT PACEMAKER INSERTION;  Surgeon: Marinus Maw, MD;  Location: [facility] CATH LAB;  Service: Cardiovascular;  Laterality: N/A;    Social History  reports that he quit smoking about 67 years ago. His smoking use included Cigarettes. He  has a .9 pack-year smoking history. He has never used smokeless tobacco. He reports that he does not drink alcohol or use illicit drugs.  Family History family history includes Cancer in his brother; Coronary artery disease in his other; Heart attack in his brother, brother, brother, brother, and sister; Heart disease in his father and other; Heart failure in his brother.   Review of Systems  Respiratory: Positive for shortness of breath.   Cardiovascular: Positive for chest pain.  Gastrointestinal: Negative.   Musculoskeletal: Negative.   Neurological: Negative.   Hematological: Negative.   Psychiatric/Behavioral: Negative.   All other systems reviewed and are negative.   BP 140/74 mmHg  Pulse 63  Resp 16  Ht  (1.753 m)  Wt 165 lb 8 oz (75.07 kg)  BMI 24.43 kg/m2   Physical Exam  Constitutional: He is oriented to person, place, and time. He appears well-developed and well-nourished.  HENT:  Head: Normocephalic.  Nose: Nose normal.  Mouth/Throat: Oropharynx is clear and moist.  Eyes: Conjunctivae are normal. Pupils are equal, round, and reactive to light.  Neck: Normal range of motion. Neck supple. No JVD present.  Cardiovascular: Normal rate, regular rhythm, S1 normal, S2 normal, normal heart sounds and intact distal pulses.  Exam reveals no gallop and no friction rub.   No murmur heard. Pulmonary/Chest: Effort normal and breath sounds normal. No respiratory distress. He has no wheezes. He has no rales. He exhibits no tenderness.  Abdominal: Soft. Bowel sounds are normal. He exhibits no distension. There is no tenderness.  Musculoskeletal: Normal range of motion. He exhibits no edema or tenderness.  Lymphadenopathy:    He has no cervical adenopathy.  Neurological: He is alert and oriented to person, place, and time. Coordination normal.  Skin: Skin is warm and dry. No rash noted. No erythema.  Psychiatric: He has a normal mood and affect. His behavior is normal.  Judgment and thought content normal.      Assessment and Plan   Nursing note and vitals reviewed.

## 2016-03-11 ENCOUNTER — Ambulatory Visit (INDEPENDENT_AMBULATORY_CARE_PROVIDER_SITE_OTHER): Payer: Medicare Other | Admitting: *Deleted

## 2016-03-11 DIAGNOSIS — I442 Atrioventricular block, complete: Secondary | ICD-10-CM | POA: Diagnosis not present

## 2016-03-11 LAB — CUP PACEART REMOTE DEVICE CHECK
Battery Remaining Longevity: 124 mo
Brady Statistic AP VP Percent: 1 %
Brady Statistic AP VS Percent: 28 %
Brady Statistic AS VP Percent: 1 %
Brady Statistic RA Percent Paced: 28 %
Brady Statistic RV Percent Paced: 1 %
Date Time Interrogation Session: 20170425060015
Implantable Lead Implant Date: 20141010
Lead Channel Impedance Value: 350 Ohm
Lead Channel Impedance Value: 440 Ohm
Lead Channel Pacing Threshold Amplitude: 1 V
Lead Channel Sensing Intrinsic Amplitude: 2.5 mV
Lead Channel Setting Pacing Amplitude: 2 V
Lead Channel Setting Pacing Pulse Width: 0.4 ms
Lead Channel Setting Sensing Sensitivity: 1 mV
MDC IDC LEAD IMPLANT DT: 20141010
MDC IDC LEAD LOCATION: 753859
MDC IDC LEAD LOCATION: 753860
MDC IDC MSMT BATTERY REMAINING PERCENTAGE: 95.5 %
MDC IDC MSMT BATTERY VOLTAGE: 2.99 V
MDC IDC MSMT LEADCHNL RA PACING THRESHOLD AMPLITUDE: 0.75 V
MDC IDC MSMT LEADCHNL RA PACING THRESHOLD PULSEWIDTH: 0.6 ms
MDC IDC MSMT LEADCHNL RA SENSING INTR AMPL: 3.5 mV
MDC IDC MSMT LEADCHNL RV PACING THRESHOLD PULSEWIDTH: 0.4 ms
MDC IDC SET LEADCHNL RV PACING AMPLITUDE: 1.25 V
MDC IDC STAT BRADY AS VS PERCENT: 72 %
Pulse Gen Model: 2240
Pulse Gen Serial Number: 2980518

## 2016-03-11 NOTE — Progress Notes (Signed)
Remote pacemaker transmission.   

## 2016-03-31 ENCOUNTER — Other Ambulatory Visit: Payer: Self-pay

## 2016-03-31 DIAGNOSIS — R42 Dizziness and giddiness: Secondary | ICD-10-CM

## 2016-04-15 ENCOUNTER — Ambulatory Visit: Payer: Medicare Other

## 2016-04-15 DIAGNOSIS — R42 Dizziness and giddiness: Secondary | ICD-10-CM

## 2016-04-15 DIAGNOSIS — I6523 Occlusion and stenosis of bilateral carotid arteries: Secondary | ICD-10-CM

## 2016-04-16 ENCOUNTER — Telehealth: Payer: Self-pay

## 2016-04-16 MED ORDER — DILTIAZEM HCL 30 MG PO TABS: 30.0000 mg | ORAL_TABLET | Freq: Three times a day (TID) | ORAL | Status: DC | PRN

## 2016-04-16 NOTE — Telephone Encounter (Signed)
Okay to send in diltiazem 30 mg doses Prescription can be written 3 times a day as needed Okay to take twice a day

## 2016-04-16 NOTE — Telephone Encounter (Signed)
Patient would like a refill on Diltiazem 30 mg takes one tablet twice a day. His current medication list has Diltiazem 120 mg one tablet daily.  The patient stopped the Diltizem 120 mg.  Please advise what the patient should be on.   [facility] 90 day supply

## 2016-04-23 ENCOUNTER — Encounter: Payer: Self-pay | Admitting: Cardiology

## 2016-06-10 ENCOUNTER — Ambulatory Visit (INDEPENDENT_AMBULATORY_CARE_PROVIDER_SITE_OTHER): Payer: Medicare Other | Admitting: *Deleted

## 2016-06-10 DIAGNOSIS — I442 Atrioventricular block, complete: Secondary | ICD-10-CM

## 2016-06-10 DIAGNOSIS — Z45018 Encounter for adjustment and management of other part of cardiac pacemaker: Secondary | ICD-10-CM

## 2016-06-10 NOTE — Progress Notes (Signed)
Remote pacemaker transmission.   

## 2016-06-11 ENCOUNTER — Encounter: Payer: Self-pay | Admitting: Cardiology

## 2016-06-16 LAB — CUP PACEART REMOTE DEVICE CHECK
Battery Remaining Longevity: 124 mo
Brady Statistic AP VS Percent: 29 %
Brady Statistic AS VP Percent: 1 %
Brady Statistic RV Percent Paced: 1 %
Date Time Interrogation Session: 20170725060022
Implantable Lead Implant Date: 20141010
Implantable Lead Location: 753859
Lead Channel Impedance Value: 430 Ohm
Lead Channel Pacing Threshold Amplitude: 1 V
Lead Channel Sensing Intrinsic Amplitude: 2.8 mV
Lead Channel Setting Pacing Pulse Width: 0.4 ms
MDC IDC LEAD IMPLANT DT: 20141010
MDC IDC LEAD LOCATION: 753860
MDC IDC MSMT BATTERY REMAINING PERCENTAGE: 95.5 %
MDC IDC MSMT BATTERY VOLTAGE: 2.99 V
MDC IDC MSMT LEADCHNL RA PACING THRESHOLD AMPLITUDE: 0.75 V
MDC IDC MSMT LEADCHNL RA PACING THRESHOLD PULSEWIDTH: 0.6 ms
MDC IDC MSMT LEADCHNL RA SENSING INTR AMPL: 3.6 mV
MDC IDC MSMT LEADCHNL RV IMPEDANCE VALUE: 340 Ohm
MDC IDC MSMT LEADCHNL RV PACING THRESHOLD PULSEWIDTH: 0.4 ms
MDC IDC SET LEADCHNL RA PACING AMPLITUDE: 2 V
MDC IDC SET LEADCHNL RV PACING AMPLITUDE: 1.25 V
MDC IDC SET LEADCHNL RV SENSING SENSITIVITY: 1 mV
MDC IDC STAT BRADY AP VP PERCENT: 1 %
MDC IDC STAT BRADY AS VS PERCENT: 70 %
MDC IDC STAT BRADY RA PERCENT PACED: 29 %
Pulse Gen Model: 2240
Pulse Gen Serial Number: 2980518

## 2016-07-18 ENCOUNTER — Telehealth: Payer: Self-pay | Admitting: Cardiovascular Disease

## 2016-07-18 NOTE — Telephone Encounter (Signed)
Patient heart rate went up to 115 last night and he felt dizzy. He took an extra diltdiltiazem (CARDIZEM) 30 MG tabletiazem . Please call patient.

## 2016-07-18 NOTE — Telephone Encounter (Signed)
Patient called in reporting that he had an episode where his heart rate was up to 115, irregular, and it didn't feel well. He states that he took a diltiazem 30 mg dose and it came down to 98 and then took another dose at 2 AM and heart rate was still at 98, irregular, but not as bad. He took his 120 mg pill this AM and currently his heart rate is 105 but he just got back from walking to the mailbox. He reports the episode last night happened while he was sitting still. He just wanted to see how many doses of the 30 mg pills he could take while still on the 120. Instructed him to please give us a call if he is starting to take 2 to 3 extra pills a day in addition to the 120 mg extended release pill. He is due to come in for regular follow up so moved his appt sooner. He is scheduled to come in 07/25/16 to see Dr. Mariah MillingGollan. He verbalized understanding of our conversation and had no further questions at this time.

## 2016-07-25 ENCOUNTER — Encounter: Payer: Self-pay | Admitting: Cardiovascular Disease

## 2016-07-25 ENCOUNTER — Ambulatory Visit (INDEPENDENT_AMBULATORY_CARE_PROVIDER_SITE_OTHER): Payer: Medicare Other | Admitting: Cardiovascular Disease

## 2016-07-25 VITALS — BP 120/70 | HR 61 | Ht 68.0 in | Wt 163.2 lb

## 2016-07-25 DIAGNOSIS — J8410 Pulmonary fibrosis, unspecified: Secondary | ICD-10-CM

## 2016-07-25 DIAGNOSIS — R0602 Shortness of breath: Secondary | ICD-10-CM

## 2016-07-25 DIAGNOSIS — I10 Essential (primary) hypertension: Secondary | ICD-10-CM

## 2016-07-25 DIAGNOSIS — I442 Atrioventricular block, complete: Secondary | ICD-10-CM | POA: Diagnosis not present

## 2016-07-25 DIAGNOSIS — I951 Orthostatic hypotension: Secondary | ICD-10-CM

## 2016-07-25 DIAGNOSIS — R079 Chest pain, unspecified: Secondary | ICD-10-CM

## 2016-07-25 DIAGNOSIS — I471 Supraventricular tachycardia: Secondary | ICD-10-CM | POA: Diagnosis not present

## 2016-07-25 NOTE — Progress Notes (Signed)
Cardiology Office Note  Date:  07/25/2016   ID:  Walter Townsend, DOB May 15, 1925, MRN [id number]  PCP:  Rafael Bihari, MD   Chief Complaint  Patient presents with  . Other    Follow up from irreg. heart beats, shortness of breath and chest pain.  Meds reviewed by the pt. verbally.    HPI:  82 yo gentleman with a h/o of tachypalpitations, well controlled on diltiazem, status post pacemaker , hyperlipidemia, GERD, esophageal stricture, worsening SOB, dx with pulmonary fibrosis, presenting for followup for his nocturia and labile hypertension .  Recent events discussed with him Sister died, She had a fall, presented to the hospital, had a heart attack, hospice for 2 weeks 82 yo Stress cleaning out his sister's belongings, getting apartment ready to sell  In the setting of recent stressors, He had tachycardia,  Rate 120s for several hours, Finally down to 60 after he took extra diltiazem During taking diltiazem extended release 120 mg daily  He has been having Pain in muscle left chest, left pectoral discomfort with palpation  Lab work reviewed with him in detail HBA1C 6.4 in 2015 Total chol 120 LDL 41  EKG on today's visit shows normal sinus rhythm with rate 61 bpm, right bundle branch block  Other past medical history Chronic atypical chest pain Episodes of lightheadedness, previously felt to be secondary to low blood pressure History of significant nocturia   previously  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 He became exhausted, shortness of breath, could barely make it back into his house. He checked his blood pressure which was 95/55, heart rate 90. His knee is giving him problems, pain. Sees orthopedics for shot [name]  He only smoked for 4 years as a teenager but did have significant secondhand smoke. Previously seen by pulmonary for lung dz  PMH:   has a past medical history of Hyperlipidemia; Hypertension; Orthostatic hypotension (08/22/2014);  Prostate enlargement; Pulmonary fibrosis (HCC); and Rapid palpitations.  PSH:    Past Surgical History:  Procedure Laterality Date  . BACK SURGERY    . FOOT SURGERY    . HERNIA REPAIR    . INSERT / REPLACE / REMOVE PACEMAKER    . PACEMAKER INSERTION  08/26/2013   St. Jude Assurity DDD pacemaker  . PERMANENT PACEMAKER INSERTION N/A 08/26/2013   Procedure: PERMANENT PACEMAKER INSERTION;  Surgeon: Marinus Maw, MD;  Location: [facility] CATH LAB;  Service: Cardiovascular;  Laterality: N/A;  . TEMPORARY PACEMAKER INSERTION N/A 08/25/2013   Procedure: TEMPORARY PACEMAKER INSERTION;  Surgeon: Lesleigh Noe, MD;  Location: [facility] CATH LAB;  Service: Cardiovascular;  Laterality: N/A;    Current Outpatient Prescriptions  Medication Sig Dispense Refill  . aspirin 81 MG EC tablet Take 81 mg by mouth at bedtime.     Marland Kitchen atorvastatin (LIPITOR) 20 MG tablet Take 1 tablet (20 mg total) by mouth daily. 90 tablet 3  . Calcium Carbonate-Vitamin D (CALCIUM-VITAMIN D) 600-200 MG-UNIT CAPS Take 1 capsule by mouth daily.      . cyanocobalamin 100 MCG tablet Take 100 mcg by mouth daily.    Marland Kitchen diltiazem (CARDIZEM) 30 MG tablet Take 1 tablet (30 mg total) by mouth 3 (three) times daily as needed. 90 tablet 6  . docusate sodium (COLACE) 100 MG capsule Take 100 mg by mouth at bedtime as needed for constipation.    . Garlic Oil 1000 MG CAPS Take 1 capsule by mouth daily.      Marland Kitchen glucosamine-chondroitin  500-400 MG tablet Take 1 tablet by mouth daily.      . hydrocortisone (ANUSOL-HC) 2.5 % rectal cream Place 1 application rectally 3 (three) times daily. 30 g 1  . lansoprazole (PREVACID) 30 MG capsule Take 1 capsule (30 mg total) by mouth daily. 90 capsule 3  . lisinopril (ZESTRIL) 2.5 MG tablet Take 1 tablet (2.5 mg total) by mouth daily.    . Multiple Vitamin (MULTIVITAMIN) tablet Take 1 tablet by mouth daily.      . saw palmetto 160 MG capsule Take 160 mg by mouth daily.      . tamsulosin (FLOMAX) 0.4 MG CAPS capsule Take  1 capsule (0.4 mg total) by mouth daily. 90 capsule 1  . vitamin E 1000 UNIT capsule Take 1,000 Units by mouth 3 (three) times a week.    . diltiazem (CARDIZEM CD) 120 MG 24 hr capsule Take 1 capsule (120 mg total) by mouth daily. 90 capsule 3   No current facility-administered medications for this visit.      Allergies:   Review of patient's allergies indicates no known allergies.   Social History:  The patient  reports that he quit smoking about 67 years ago. His smoking use included Cigarettes. He has a 0.90 pack-year smoking history. He has never used smokeless tobacco. He reports that he does not drink alcohol or use drugs.   Family History:   family history includes Cancer in his brother; Coronary artery disease in his other; Heart attack in his brother, brother, brother, brother, and sister; Heart disease in his father and other; Heart disease (age of onset: 59) in his son; Heart failure in his brother.    Review of Systems: Review of Systems  Respiratory: Positive for shortness of breath.   Cardiovascular: Positive for palpitations.  Gastrointestinal: Negative.   Musculoskeletal: Positive for myalgias.  Neurological: Positive for weakness.  Psychiatric/Behavioral: Negative.   All other systems reviewed and are negative.    PHYSICAL EXAM: VS:  BP 120/70 (BP Location: Left Arm, Patient Position: Sitting, Cuff Size: Normal)   Pulse 61   Ht 5\' 8"  (1.727 m)   Wt 163 lb 4 oz (74 kg)   BMI 24.82 kg/m  , BMI Body mass index is 24.82 kg/m. GEN: Well nourished, well developed, in no acute distress  HEENT: normal  Neck: no JVD, carotid bruits, or masses Cardiac: RRR; no murmurs, rubs, or gallops,no edema  Respiratory:  Crackles right base > left, normal work of breathing GI: soft, nontender, nondistended, + BS MS: no deformity or atrophy  Skin: warm and dry, no rash Neuro:  Strength and sensation are intact Psych: euthymic mood, full affect    Recent Labs: No results  found for requested labs within last 8760 hours.    Lipid Panel No results found for: CHOL, HDL, LDLCALC, TRIG    Wt Readings from Last 3 Encounters:  07/25/16 163 lb 4 oz (74 kg)  02/06/16 165 lb 8 oz (75.1 kg)  10/30/15 167 lb 1.9 oz (75.8 kg)       ASSESSMENT AND PLAN:  AV block, complete-intermittent - Plan: EKG 12-Lead, NM Myocar Multi W/Spect W/Wall Motion / EF Pacemaker, followed by Dr. Graciela Husbands  Essential hypertension - Plan: EKG 12-Lead, NM Myocar Multi W/Spect W/Wall Motion / EF Blood pressure is well controlled on today's visit. No changes made to the medications.  SVT/ PSVT/ PAT - Plan: EKG 12-Lead, NM Myocar Multi W/Spect W/Wall Motion / EF Recent episode of tachycardia, likely atrial tachycardia.  Improved with diltiazem Otherwise having rare episodes. Likely brought on by recent stressors, death of his sister   SOB (shortness of breath) - Plan: NM Myocar Multi W/Spect W/Wall Motion / EF He is concerned about underlying coronary artery disease and ischemia as a cause of his worsening shortness of breath. Stress test has been ordered. Likely unable to treadmill, though he would like to try. suspect he will need pharmacologic study given underlying lung disease.   Chest pain, unspecified chest pain type - Plan: NM Myocar Multi W/Spect W/Wall Motion / EF  atypical chest pain, left pectoral region  Though he is concerned about underlying coronary artery disease. Stress test has been ordered . Very strong family history   Orthostatic hypotension  tolerating diltiazem 120 mg daily  Reports blood pressure adequate  Previously had to decrease dose for hypotension /orthostasis  Pulmonary fibrosis (HCC) Followed by pulmonary, slow progressive symptoms, worsening shortness of breath on exertion     Total encounter time more than 25 minutes  Greater than 50% was spent in counseling and coordination of care with the patient  Disposition:   F/U  6 months   Orders Placed  This Encounter  Procedures  . NM Myocar Multi W/Spect W/Wall Motion / EF  . EKG 12-Lead     Signed, Dossie Arbourim Jasani Lengel, M.D., Ph.D. 07/25/2016  [facility], [place] [phone]

## 2016-07-25 NOTE — Patient Instructions (Addendum)
Medication Instructions:   Please hold the cholesterol medication (atorvastatin) for a few weeks to see if cramps go away. If cramps resolve, please call the office for different cholesterol lmedications  Labwork:  No new labs needed  Testing/Procedures: [facility] MYOVIEW  Your caregiver has ordered a Stress Test with nuclear imaging. The purpose of this test is to evaluate the blood supply to your heart muscle. This procedure is referred to as a "Non-Invasive Stress Test." This is because other than having an IV started in your vein, nothing is inserted or "invades" your body. Cardiac stress tests are done to find areas of poor blood flow to the heart by determining the extent of coronary artery disease (CAD). Some patients exercise on a treadmill, which naturally increases the blood flow to your heart, while others who are  unable to walk on a treadmill due to physical limitations have a pharmacologic/chemical stress agent called Lexiscan . This medicine will mimic walking on a treadmill by temporarily increasing your coronary blood flow.   Please note: these test may take anywhere between 2-4 hours to complete  PLEASE REPORT TO [facility] MEDICAL MALL ENTRANCE  THE VOLUNTEERS AT THE FIRST DESK WILL DIRECT YOU WHERE TO GO  Date of Procedure:____Thursday, September 21_______  Arrival Time for Procedure:_______8:15 am______________  How to prepare for your Myoview test:  1. Do not eat or drink after midnight 2. No caffeine for 24 hours prior to test 3. No smoking 24 hours prior to test. 4. Your medication may be taken with water.  If your doctor stopped a medication because of this test, do not take that medication. 5. Please wear a short sleeve shirt. 6. No perfume, cologne or lotion. 7. Wear comfortable walking shoes. No heels!   Follow-Up: It was a pleasure seeing you in the office today. Please call us if you have new issues that need to be addressed before your next appt.   [phone]  Your physician wants you to follow-up in: 6 months.  You will receive a reminder letter in the mail two months in advance. If you don't receive a letter, please call our office to schedule the follow-up appointment.  If you need a refill on your cardiac medications before your next appointment, please call your pharmacy.    Cardiac Nuclear Scanning A cardiac nuclear scan is used to check your heart for problems, such as the following:  A portion of the heart is not getting enough blood.  Part of the heart muscle has died, which happens with a heart attack.  The heart wall is not working normally.  In this test, a radioactive dye (tracer) is injected into your bloodstream. After the tracer has traveled to your heart, a scanning device is used to measure how much of the tracer is absorbed by or distributed to various areas of your heart. LET [facility] CARE PROVIDER KNOW ABOUT:  Any allergies you have.  All medicines you are taking, including vitamins, herbs, eye drops, creams, and over-the-counter medicines.  Previous problems you or members of your family have had with the use of anesthetics.  Any blood disorders you have.  Previous surgeries you have had.  Medical conditions you have.  RISKS AND COMPLICATIONS Generally, this is a safe procedure. However, as with any procedure, problems can occur. Possible problems include:   Serious chest pain.  Rapid heartbeat.  Sensation of warmth in your chest. This usually passes quickly. BEFORE THE PROCEDURE Ask your health care provider about changing or stopping  your regular medicines. PROCEDURE This procedure is usually done at a hospital and takes 2-4 hours.  An IV tube is inserted into one of your veins.  Your health care provider will inject a small amount of radioactive tracer through the tube.  You will then wait for 20-40 minutes while the tracer travels through your bloodstream.  You will lie down  on an exam table so images of your heart can be taken. Images will be taken for about 15-20 minutes.  You will exercise on a treadmill or stationary bike. While you exercise, your heart activity will be monitored with an electrocardiogram (ECG), and your blood pressure will be checked.  If you are unable to exercise, you may be given a medicine to make your heart beat faster.  When blood flow to your heart has peaked, tracer will again be injected through the IV tube.  After 20-40 minutes, you will get back on the exam table and have more images taken of your heart.  When the procedure is over, your IV tube will be removed. AFTER THE PROCEDURE  You will likely be able to leave shortly after the test. Unless your health care provider tells you otherwise, you may return to your normal schedule, including diet, activities, and medicines.  Make sure you find out how and when you will get your test results.   This information is not intended to replace advice given to you by your health care provider. Make sure you discuss any questions you have with your health care provider.   Document Released: 11/28/2004 Document Revised: 11/08/2013 Document Reviewed: 10/12/2013 Elsevier Interactive Patient Education [employer]2016 Elsevier Inc.

## 2016-07-29 ENCOUNTER — Telehealth: Payer: Self-pay | Admitting: Cardiovascular Disease

## 2016-07-29 NOTE — Telephone Encounter (Signed)
Pt calling stating he is not going to proceed with Myoview He is not wanting to do this, for right now he is getting a shot in the knee  And it is starting to give him some trouble he may call back to reschedule But for now he would like to stop that  Also would like us to know that his legs are doing a lot better since being off his cholesterol medicine

## 2016-07-29 NOTE — Telephone Encounter (Signed)
Left message to call back  

## 2016-07-30 MED ORDER — ROSUVASTATIN CALCIUM 5 MG PO TABS: 5.0000 mg | ORAL_TABLET | Freq: Every day | ORAL | 6 refills | Status: DC

## 2016-07-30 NOTE — Telephone Encounter (Signed)
Pt is returning our call  ° °Please call back ° °

## 2016-07-30 NOTE — Telephone Encounter (Signed)
Spoke w/ pt.  Advised him that I have cancelled his ETT myoview.  He would like to wait until after his knee injections before attempted the treadmill. He will call back to reschedule after he is feeling more confident.  Pt reports that he cannot tolerate simvastatin or atorvastatin. He states that he and Dr. Mariah MillingGollan discussed a possible alternative (he does not remember the name of the med) and he would like to try this.  He asks that we send in a 30 day supply to [facility] [place] to see if he can tolerate.

## 2016-07-30 NOTE — Telephone Encounter (Signed)
He could try generic Crestor 5 mg every other day to start After several weeks could try 5 mg daily

## 2016-08-06 ENCOUNTER — Other Ambulatory Visit: Payer: Medicare Other

## 2016-08-07 ENCOUNTER — Other Ambulatory Visit: Payer: Medicare Other

## 2016-08-08 ENCOUNTER — Ambulatory Visit: Payer: Medicare Other | Admitting: Cardiovascular Disease

## 2016-08-18 ENCOUNTER — Ambulatory Visit (INDEPENDENT_AMBULATORY_CARE_PROVIDER_SITE_OTHER): Payer: Medicare Other

## 2016-08-18 DIAGNOSIS — Z23 Encounter for immunization: Secondary | ICD-10-CM | POA: Diagnosis not present

## 2016-09-02 ENCOUNTER — Ambulatory Visit (INDEPENDENT_AMBULATORY_CARE_PROVIDER_SITE_OTHER): Payer: Medicare Other | Admitting: Internal Medicine

## 2016-09-02 ENCOUNTER — Encounter: Payer: Self-pay | Admitting: Internal Medicine

## 2016-09-02 VITALS — BP 140/62 | HR 64 | Ht 69.0 in | Wt 167.0 lb

## 2016-09-02 DIAGNOSIS — I951 Orthostatic hypotension: Secondary | ICD-10-CM | POA: Diagnosis not present

## 2016-09-02 DIAGNOSIS — R001 Bradycardia, unspecified: Secondary | ICD-10-CM | POA: Diagnosis not present

## 2016-09-02 DIAGNOSIS — I442 Atrioventricular block, complete: Secondary | ICD-10-CM | POA: Diagnosis not present

## 2016-09-02 DIAGNOSIS — Z950 Presence of cardiac pacemaker: Secondary | ICD-10-CM

## 2016-09-02 DIAGNOSIS — I10 Essential (primary) hypertension: Secondary | ICD-10-CM

## 2016-09-02 DIAGNOSIS — I480 Paroxysmal atrial fibrillation: Secondary | ICD-10-CM

## 2016-09-02 LAB — CUP PACEART INCLINIC DEVICE CHECK
Battery Remaining Longevity: 136.8
Brady Statistic RV Percent Paced: 0.24 %
Date Time Interrogation Session: 20171017151649
Implantable Lead Implant Date: 20141010
Implantable Lead Location: 753859
Lead Channel Pacing Threshold Amplitude: 1 V
Lead Channel Pacing Threshold Amplitude: 1.125 V
Lead Channel Pacing Threshold Pulse Width: 0.6 ms
Lead Channel Sensing Intrinsic Amplitude: 2.2 mV
Lead Channel Setting Pacing Amplitude: 1.375
Lead Channel Setting Sensing Sensitivity: 1 mV
MDC IDC LEAD IMPLANT DT: 20141010
MDC IDC LEAD LOCATION: 753860
MDC IDC MSMT BATTERY VOLTAGE: 2.99 V
MDC IDC MSMT LEADCHNL RA IMPEDANCE VALUE: 475 Ohm
MDC IDC MSMT LEADCHNL RA SENSING INTR AMPL: 4.2 mV
MDC IDC MSMT LEADCHNL RV IMPEDANCE VALUE: 362.5 Ohm
MDC IDC MSMT LEADCHNL RV PACING THRESHOLD PULSEWIDTH: 0.4 ms
MDC IDC PG SERIAL: 2980518
MDC IDC SET LEADCHNL RA PACING AMPLITUDE: 2 V
MDC IDC SET LEADCHNL RV PACING PULSEWIDTH: 0.4 ms
MDC IDC STAT BRADY RA PERCENT PACED: 31 %

## 2016-09-02 MED ORDER — APIXABAN 5 MG PO TABS: 5.0000 mg | ORAL_TABLET | Freq: Two times a day (BID) | ORAL | 6 refills | Status: DC

## 2016-09-02 NOTE — Patient Instructions (Addendum)
Medication Instructions: - Your physician has recommended you make the following change in your medication:  1) Stop aspirin 2) Start eliquis 5 mg one tablet by mouth twice daily  Labwork: - Your physician recommends that you have lab work today: BMP  Procedures/Testing: - none ordered  Follow-Up: - Your physician recommends that you schedule a follow-up appointment in: 6-8 weeks with Dr. Mariah MillingGollan/ PA/ NP  - Remote monitoring is used to monitor your Pacemaker of ICD from home. This monitoring reduces the number of office visits required to check your device to one time per year. It allows us to keep an eye on the functioning of your device to ensure it is working properly. You are scheduled for a device check from home on 12/02/16. You may send your transmission at any time that day. If you have a wireless device, the transmission will be sent automatically. After your physician reviews your transmission, you will receive a postcard with your next transmission date.  - Your physician wants you to follow-up in: 1 year with Dr. Graciela HusbandsKlein. You will receive a reminder letter in the mail two months in advance. If you don't receive a letter, please call our office to schedule the follow-up appointment.  Any Additional Special Instructions Will Be Listed Below (If Applicable).     If you need a refill on your cardiac medications before your next appointment, please call your pharmacy.

## 2016-09-02 NOTE — Progress Notes (Signed)
Patient Care Team: Walter BihariJohn B Walker III, MD as PCP - General (Unknown Physician Specialty)   HPI  Walter Townsend is a 82 y.o. male Seen following pacemaker implantation 10/14 for symptomatic high-grade heart block . Echo at that time demonstrated normal left ventricular function  He has had no more slow heartbeats. Exercise tolerance is stable  He has had some problems with orthostatic intolerance probably enough to manipulate his drugs.  Has had some tachycardia palpitations.  Bad episode 9/17  Took extra dilt on his own  He denies chest pain or shortness of breath.  Outside records reviewed    office records Thromboembolic risk profile notable for age-73, hypertension-1 for a CHADS-VASc score of 3   7/17 Cr 1.0 (Care Everywhere)  Past Medical History:  Diagnosis Date  . Cardiac pacemaker st Judes   . Hyperlipidemia   . Hypertension   . Orthostatic hypotension 08/22/2014  . PAF (paroxysmal atrial fibrillation) (HCC)   . Prostate enlargement   . Pulmonary fibrosis (HCC)     Past Surgical History:  Procedure Laterality Date  . BACK SURGERY    . FOOT SURGERY    . HERNIA REPAIR    . INSERT / REPLACE / REMOVE PACEMAKER    . PACEMAKER INSERTION  08/26/2013   St. Jude Assurity DDD pacemaker  . PERMANENT PACEMAKER INSERTION N/A 08/26/2013   Procedure: PERMANENT PACEMAKER INSERTION;  Surgeon: Marinus MawGregg W Taylor, MD;  [facility];  Service: Cardiovascular;  Laterality: N/A;  . TEMPORARY PACEMAKER INSERTION N/A 08/25/2013   Procedure: TEMPORARY PACEMAKER INSERTION;  Surgeon: Lesleigh NoeHenry W Smith III, MD;  Location[facility] C[facility] LAB;  Service: Cardiovascular;  Laterality: N/A;    Current Outpatient Prescriptions  Medication Sig Dispense Refill  . atorvastatin (LIPITOR) 20 MG tablet Take 1 tablet (20 mg total) by mouth daily. 90 tablet 3  . Calcium Carbonate-Vitamin D (CALCIUM-VITAMIN D) 600-200 MG-UNIT CAPS Take 1 capsule by mouth daily.      . cyanocobalamin 100 MCG tablet  Take 100 mcg by mouth daily.    Marland Kitchen. diltiazem (CARDIZEM CD) 120 MG 24 hr capsule Take 1 capsule (120 mg total) by mouth daily. 90 capsule 3  . docusate sodium (COLACE) 100 MG capsule Take 100 mg by mouth at bedtime as needed for constipation.    . Garlic Oil 1000 MG CAPS Take 1 capsule by mouth daily.      Marland Kitchen. glucosamine-chondroitin 500-400 MG tablet Take 1 tablet by mouth daily.      . lansoprazole (PREVACID) 30 MG capsule Take 1 capsule (30 mg total) by mouth daily. 90 capsule 3  . lisinopril (ZESTRIL) 2.5 MG tablet Take 1 tablet (2.5 mg total) by mouth daily.    . Multiple Vitamin (MULTIVITAMIN) tablet Take 1 tablet by mouth daily.      . saw palmetto 160 MG capsule Take 160 mg by mouth daily.      . tamsulosin (FLOMAX) 0.4 MG CAPS capsule Take 1 capsule (0.4 mg total) by mouth daily. 90 capsule 1  . vitamin E 1000 UNIT capsule Take 1,000 Units by mouth 3 (three) times a week.     No current facility-administered medications for this visit.     No Known Allergies  Review of Systems negative except from HPI and PMH  Physical Exam BP 140/62   Pulse 64   Ht 5\' 9"  (1.753 m)   Wt 167 lb (75.8 kg)   SpO2 96%   BMI 24.66 kg/m  Well developed and nourished in no acute distress HENT normal Neck supple with JVP-flat Clear Regular rate and rhythm, no murmurs or gallops Abd-soft with active BS No Clubbing cyanosis edema Skin-warm and dry A & Oriented  Grossly normal sensory and motor function  ECG demonstrates sinus rhythm with right bundle branch block and left axis deviation Intervals 17/15/42   Assessment and  Plan  Hypertension     Orthostatic hypotension  Complete heart block -intermittent  stable post pacing  Sinus bradycardia  Pacemaker St Jude   The patient's device was interrogated.  The information was reviewed. No changes were made in the programming.     Atrial fibrillation  He has documented paroxysmal atrial fibrillation   CHADS-VASc score is 3 so it is  appropriate to use anticoagulation. We discussed the use of the NOACs compared to Coumadin. We briefly reviewed the data of at least comparability in stroke prevention, bleeding and outcome. We discussed some of the new once wherein somewhat associated with decreased ischemic stroke risk, one to be taken daily, and has been shown to be comparable and bleeding risk to aspirin.  We also discussed bleeding associated with warfarin as well as NOACs and a wall bleeding as a complication of all these drugs intracranial bleeding is more frequently associated with warfarin then the NOACs and a GI bleeding is more commonly associated with the latter  We will begin him on apixoban 5 mg twice daily. We will recheck his metabolic profile will I suspect his creatinine will be okay as was in July  Blood pressure is much better.

## 2016-09-03 LAB — BASIC METABOLIC PANEL
BUN / CREAT RATIO: 20 (ref 10–24)
BUN: 19 mg/dL (ref 8–27)
CO2: 24 mmol/L (ref 18–29)
CREATININE: 0.96 mg/dL (ref 0.76–1.27)
Calcium: 9.1 mg/dL (ref 8.6–10.2)
Chloride: 100 mmol/L (ref 96–106)
GFR calc Af Amer: 81 mL/min/{1.73_m2} (ref >59)
GFR, EST NON AFRICAN AMERICAN: 70 mL/min/{1.73_m2} (ref >59)
GLUCOSE: 112 mg/dL — AB (ref 65–99)
POTASSIUM: 4.8 mmol/L (ref 3.5–5.2)
SODIUM: 138 mmol/L (ref 134–144)

## 2016-09-16 ENCOUNTER — Telehealth: Payer: Self-pay

## 2016-09-16 NOTE — Telephone Encounter (Signed)
Pt is aware and agreeable to normal results. On 09/02/16 OV pt was instructed to start Eliquis 5 mg BID and pt states that shortly after that OV he scrapped his leg and it "[place] like water. The blood was down to my ankle before I even knew what was a happening and I just barely grazed my leg." So for the last 3-4 days pt has been cutting 5mg  tables in half and taking 2.5 mg in the AM and 2.5 mg in the PM. Pt states this makes him feel better. I told him that I would touch base with Dr. Graciela HusbandsKlein later this afternoon to confirm whether it is ok for him to continue the lower dose or if he needs to go back on the higher 5 mg dose. He told me to call him on his home phone and leave a voicemail for him if I cannot reach him. He's agreeable to plan

## 2016-09-18 ENCOUNTER — Telehealth: Payer: Self-pay | Admitting: Internal Medicine

## 2016-09-18 NOTE — Telephone Encounter (Signed)
Follow Up:  Returning your call,if not at home call him on his cell phone please.

## 2016-09-18 NOTE — Telephone Encounter (Signed)
Notes Recorded by Jefferey PicaHeather C Rivan Siordia, RN on 09/18/2016 at 5:24 PM EDT I spoke with the patient, he has not had any more scrapes/ bleeding. I have advised him of Dr. Odessa FlemingKlein's recommendations to increase eliquis back to 5 mg BID when he feels comfortable. He is agreeable. ------  Notes Recorded by Jefferey PicaHeather C Ellison Rieth, RN on 09/17/2016 at 4:31 PM EDT Reviewed the message below with Dr. Graciela HusbandsKlein. Per Dr. Graciela HusbandsKlein, the patient will need to resume his 5 mg BID dose of Eliquis as soon as he feels ready as the lower dose is of less benefit to him. I left a message for the patient to call. ------  Notes Recorded by Rebbeca Pauletavia Kenan, CMA on 09/16/2016 at 11:14 AM EDT Pt is aware and agreeable to normal results. On 09/02/16 OV pt was instructed to start Eliquis 5 mg BID and pt states that shortly after that OV he scrapped his leg and it "[place] water. The blood was down to my ankle before I even knew what was a happening and I just barely grazed my leg." So for the last 3-4 days pt has been cutting 5mg  tables in half and taking 2.5 mg in the AM and 2.5 mg in the PM. Pt states this makes him feel better. I told him that I would touch base with Dr. Graciela HusbandsKlein later this afternoon to confirm whether it is ok for him to continue the lower dose or if he needs to go back on the higher 5 mg dose. He told me to call him on his home phone and leave a voicemail for him if I cannot reach him. He's agreeable to plan ------

## 2016-09-18 NOTE — Telephone Encounter (Signed)
Mr. Harvest DarkJustice is returning your call about the Eliquis . Please call

## 2016-09-22 ENCOUNTER — Telehealth: Payer: Self-pay | Admitting: Cardiovascular Disease

## 2016-09-22 ENCOUNTER — Ambulatory Visit (INDEPENDENT_AMBULATORY_CARE_PROVIDER_SITE_OTHER): Payer: Medicare Other | Admitting: Cardiovascular Disease

## 2016-09-22 ENCOUNTER — Encounter: Payer: Self-pay | Admitting: Cardiovascular Disease

## 2016-09-22 VITALS — BP 120/72 | HR 60 | Ht 68.0 in | Wt 166.5 lb

## 2016-09-22 DIAGNOSIS — I442 Atrioventricular block, complete: Secondary | ICD-10-CM | POA: Diagnosis not present

## 2016-09-22 DIAGNOSIS — I480 Paroxysmal atrial fibrillation: Secondary | ICD-10-CM | POA: Insufficient documentation

## 2016-09-22 DIAGNOSIS — J8410 Pulmonary fibrosis, unspecified: Secondary | ICD-10-CM

## 2016-09-22 DIAGNOSIS — I10 Essential (primary) hypertension: Secondary | ICD-10-CM

## 2016-09-22 DIAGNOSIS — E7800 Pure hypercholesterolemia, unspecified: Secondary | ICD-10-CM

## 2016-09-22 DIAGNOSIS — Z45018 Encounter for adjustment and management of other part of cardiac pacemaker: Secondary | ICD-10-CM

## 2016-09-22 MED ORDER — RIVAROXABAN 20 MG PO TABS: 20.0000 mg | ORAL_TABLET | Freq: Every day | ORAL | 6 refills | Status: DC

## 2016-09-22 NOTE — Telephone Encounter (Signed)
Pt is coming today at 4pm to see Dr Mariah MillingGollan Pt is aware.

## 2016-09-22 NOTE — Telephone Encounter (Signed)
Pt calling stating he was on Eliquis for at least 3 weeks now.  He's been cutting it in half, one in the morning and half.  Starting hurting, can't tell if its his heart or left breast  He couldn't sleep because of this pain.  He took a nitro (that was his sisters) at 3 am  And took ibuprofen and those seemed to help.  He is just sick to his stomach.  Please advise He states he told Dr Mariah MillingGollan last time, but he told patient that it could be his lungs.  Not sure he states what it could be.

## 2016-09-22 NOTE — Telephone Encounter (Signed)
Spoke w/ pt.  He reports that since Dr. Graciela HusbandsKlein prescribed Eliquis 5 mg BID approx 3 weeks ago, he has been "hurting". Reports that he is sick on his stomach, and bleeds so easily that he has decreased his Eliquis to 1/2 pill BID. He believes it is causing pain his chest, though he is unsure where the pain is located. He is unsure if he is experiencing breast pain, lung pain, or if this is a cardiac issue. He woke up around 3 am and took 1 of his sister's old nitro pills and a Bufferin, which seemed to help. Reports his pain is 1-2/10. He does have dx of pulmonary fibrosis, has not had this evaluated in years, as "there's nothing they can do about it". Pt was previously sched for an ETT myoview due to sx, but he cancelled 2/2 knee pain.  He is willing to resched this as a lexiscan if Dr. Mariah MillingGollan feels this is needed.  He states that he was told that if he had any problems, to call and we would work him in to be seen. Advised him that Dr. Mariah MillingGollan does not have any openings today, nor does our NP, but I will make Dr. Mariah MillingGollan aware of his concerns and call him back w/ his recommendation.  Advised him to proceed to the ED if his sx become emergent.

## 2016-09-22 NOTE — Progress Notes (Signed)
Cardiology Office Note  Date:  09/22/2016   ID:  Walter Townsend, DOB 1925-07-01, MRN [id number]  PCP:  Walter Townsend   Chief Complaint  Patient presents with  . other    F/u due to pt c/o chest pain and SOB.    HPI:  82 yo gentleman with a h/o of tachypalpitations, well controlled on diltiazem, status post pacemaker , hyperlipidemia, GERD, esophageal stricture, worsening SOB, dx with pulmonary fibrosis, presenting for followup for his nocturia and labile hypertension .  In follow-up today he reports having left-sided chest pain yesterday, significant stress, insomnia. Had GI upset, in general did not feel well. At the end of the day took and nitroglycerin, perhaps helped his symptoms. Occasionally has days with chest discomfort Not typically associated with exertion, seems to come on at rest  Continues to have significant stress after the loss of his sister She had a condominium that he sold She has lots of properties. Lots of paperwork  Recently seen by Walter Townsend 8 mode switches, episodes of atrial fibrillation Started on eliquis.  He feels he is having a reaction to the medication, perhaps caused his abdominal distress. He would like to try different medication  He did have chest pain on his last clinic visit. 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 He became exhausted, shortness of breath, could barely make it back into his house. He checked his blood pressure which was 95/55, heart rate 90. His knee is giving him problems, pain. Sees orthopedics  for shot [name](Supartz)  He only smoked for 4 years as a teenager but did have significant secondhand smoke. Previously seen by pulmonary for lung dz  PMH:   has a past medical history of Cardiac pacemaker [employer]; Hyperlipidemia; Hypertension; Orthostatic hypotension (08/22/2014); PAF (paroxysmal atrial fibrillation) (HCC); Prostate enlargement; and Pulmonary fibrosis (HCC).  PSH:    Past Surgical History:  Procedure Laterality Date  . BACK SURGERY    . FOOT SURGERY    . HERNIA REPAIR    . INSERT / REPLACE / REMOVE PACEMAKER    . PACEMAKER INSERTION  08/26/2013   St. Jude Assurity DDD pacemaker  . PERMANENT PACEMAKER INSERTION N/A 08/26/2013   Procedure: PERMANENT PACEMAKER INSERTION;  Surgeon: Walter Townsend;  [facility];  Service: Cardiovascular;  Laterality: N/A;  . TEMPORARY PACEMAKER INSERTION N/A 08/25/2013   Procedure: TEMPORARY PACEMAKER INSERTION;  Surgeon: Walter Townsend;  [facility];  Service: Cardiovascular;  Laterality: N/A;    Current Outpatient Prescriptions  Medication Sig Dispense Refill  . apixaban (ELIQUIS) 5 MG TABS tablet Take 1 tablet (5 mg total) by mouth 2 (two) times daily. 60 tablet 6  . atorvastatin (LIPITOR) 20 MG tablet Take 1 tablet (20 mg total) by mouth daily. 90 tablet 3  . Calcium Carbonate-Vitamin D (CALCIUM-VITAMIN D) 600-200 MG-UNIT CAPS Take 1 capsule by mouth daily.      . cyanocobalamin 100 MCG tablet Take 100 mcg by mouth daily.    Marland Kitchen. diltiazem (CARDIZEM CD) 120 MG 24 hr  capsule Take 1 capsule (120 mg total) by mouth daily. 90 capsule 3  . docusate sodium (COLACE) 100 MG capsule Take 100 mg by mouth at bedtime as needed for constipation.    . Garlic Oil 1000 MG CAPS Take 1 capsule by mouth daily.      Marland Kitchen. glucosamine-chondroitin 500-400 MG tablet Take 1 tablet by mouth daily.      . lansoprazole (PREVACID) 30 MG capsule Take 1 capsule (30 mg total) by mouth daily. 90 capsule 3  . lisinopril (ZESTRIL) 2.5 MG tablet Take  1 tablet (2.5 mg total) by mouth daily.    . Multiple Vitamin (MULTIVITAMIN) tablet Take 1 tablet by mouth daily.      . saw palmetto 160 MG capsule Take 160 mg by mouth daily.      . tamsulosin (FLOMAX) 0.4 MG CAPS capsule Take 1 capsule (0.4 mg total) by mouth daily. 90 capsule 1  . vitamin E 1000 UNIT capsule Take 1,000 Units by mouth 3 (three) times a week.     No current facility-administered medications for this visit.      Allergies:   Patient has no known allergies.   Social History:  The patient  reports that he quit smoking about 67 years ago. His smoking use included Cigarettes. He has a 0.90 pack-year smoking history. He has never used smokeless tobacco. He reports that he does not drink alcohol or use drugs.   Family History:   family history includes Cancer in his brother; Coronary artery disease in his other; Heart attack in his brother, brother, brother, brother, and sister; Heart disease in his father and other; Heart disease (age of onset: 1465) in his son; Heart failure in his brother.    Review of Systems: Review of Systems  Cardiovascular: Positive for chest pain.  Gastrointestinal: Negative.   Musculoskeletal: Positive for myalgias.  Neurological: Positive for weakness.  Psychiatric/Behavioral: Negative.   All other systems reviewed and are negative.    PHYSICAL EXAM: VS:  BP 120/72 (BP Location: Left Arm, Patient Position: Sitting, Cuff Size: Normal)   Pulse 60   Ht 5\' 8"  (1.727 m)   Wt 166 lb 8 oz (75.5 kg)   BMI 25.32 kg/m  , BMI Body mass index is 25.32 kg/m. GEN: Well nourished, well developed, in no acute distress  HEENT: normal  Neck: no JVD, carotid bruits, or masses Cardiac: RRR; no murmurs, rubs, or gallops,no edema  Respiratory:  Crackles right base > left, normal work of breathing GI: soft, nontender, nondistended, + BS MS: no deformity or atrophy  Skin: warm and dry, no rash Neuro:  Strength and sensation are intact Psych: euthymic mood,  full affect    Recent Labs: 09/02/2016: BUN 19; Creatinine, Ser 0.96; Potassium 4.8; Sodium 138    Lipid Panel No results found for: CHOL, HDL, LDLCALC, TRIG    Wt Readings from Last 3 Encounters:  09/22/16 166 lb 8 oz (75.5 kg)  09/02/16 167 lb (75.8 kg)  07/25/16 163 lb 4 oz (74 kg)       ASSESSMENT AND PLAN:  AV block, complete-intermittent - Plan: EKG 12-Lead, NM Myocar Multi W/Spect W/Wall Motion / EF Pacemaker, followed by Walter Townsend  Paroxysmal atrial fibrillation 8 mode switches noted on pacer download Started on anticoagulation by Walter Townsend. He reports he is unable to tolerate eliquis. Perhaps caused his abdominal discomfort. He does not want to retry the medication.  Recommended he try an alternate medication, samples of xarelto 20 mg daily  provided. Creatinine clearance is greater than 50  Essential hypertension - Plan: EKG 12-Lead, NM Myocar Multi W/Spect W/Wall Motion / EF Blood pressure is well controlled on today's visit. No changes made to the medications.   SOB (shortness of breath) - Plan: NM Myocar Multi W/Spect W/Wall Motion / EF Stable symptoms, stress test ordered for symptoms of left-sided chest pain  Chest pain, unspecified chest pain type - Plan: NM Myocar Multi W/Spect W/Wall Motion / EF chest pain, left pectoral region  Though he is concerned about underlying coronary artery disease.  Stress test has been ordered, pharmacological study . Very strong family history   Orthostatic hypotension  tolerating diltiazem 120 mg daily  Previously had to decrease dose for hypotension /orthostasis  Pulmonary fibrosis (HCC) Followed by pulmonary, slow progressive symptoms, worsening shortness of breath on exertion   Anxiety Long history of stress/anxiety No exercise program. Recent death of his sister has added to his symptoms    Total encounter time more than 25 minutes  Greater than 50% was spent in counseling and coordination of care with the  patient  Disposition:   F/U  6 months   Orders Placed This Encounter  Procedures  . EKG 12-Lead     Signed, Dossie Arbour, M.D., Ph.D. 09/22/2016  [facility] [place], [place] [phone]

## 2016-09-22 NOTE — Patient Instructions (Addendum)
Medication Instructions:   We will stop the eliquis We will start xarelto one a day  Labwork:  No new labs needed  Testing/Procedures:  We will order a lexiscan myoview for chest pain Hold the diltiazem the morning of the test  [facility] MYOVIEW  Your caregiver has ordered a Stress Test with nuclear imaging. The purpose of this test is to evaluate the blood supply to your heart muscle. This procedure is referred to as a "Non-Invasive Stress Test." This is because other than having an IV started in your vein, nothing is inserted or "invades" your body. Cardiac stress tests are done to find areas of poor blood flow to the heart by determining the extent of coronary artery disease (CAD). Some patients exercise on a treadmill, which naturally increases the blood flow to your heart, while others who are  unable to walk on a treadmill due to physical limitations have a pharmacologic/chemical stress agent called Lexiscan . This medicine will mimic walking on a treadmill by temporarily increasing your coronary blood flow.   Please note: these test may take anywhere between 2-4 hours to complete  PLEASE REPORT TO [facility]TRANCE  THE VOLUNTEERS AT THE FIRST DESK WILL DIRECT YOU WHERE TO GO  Date of Procedure:_____Monday, November 13______  Arrival Time for Procedure:____8:45 am _________  Instructions regarding medication:   __X__ : Hold DILTIAZEM the morning of procedure  How to prepare for your Myoview test:  1. Do not eat or drink after midnight 2. No caffeine for 24 hours prior to test 3. No smoking 24 hours prior to test. 4. Your medication may be taken with water.  If your doctor stopped a medication because of this test, do not take that medication. 5. Please wear a short sleeve shirt. 6. No perfume, cologne or lotion.  Follow-Up: It was a pleasure seeing you in the office today. Please call us if you have new issues that need to be addressed before your next appt.   [phone]  Your physician wants you to follow-up in: 6 months.  You will receive a reminder letter in the mail two months in advance. If you don't receive a letter, please call our office to schedule the follow-up appointment.  If you need a refill on your cardiac medications before your next appointment, please call your pharmacy.    Cardiac Nuclear Scanning A cardiac nuclear scan is used to check your heart for problems, such as the following:  A portion of the heart is not getting enough blood.  Part of the heart muscle has died, which happens with a heart attack.  The heart wall is not working normally.  In this test, a radioactive dye (tracer) is injected into your bloodstream. After the tracer has traveled to your heart, a scanning device is used to measure how much of the tracer is absorbed by or distributed to various areas of your heart. LET [facility] PROVID[facility] KNOW ABOUT:  Any allergies you have.  All medicines you are taking, including vitamins, herbs, eye drops, creams, and over-the-counter medicines.  Previous problems you or members of your family have had with the use of anesthetics.  Any blood disorders you have.  Previous surgeries you have had.  Medical conditions you have.  RISKS AND COMPLICATIONS Generally, this is a safe procedure. However, as with any procedure, problems can occur. Possible problems include:   Serious chest pain.  Rapid heartbeat.  Sensation of warmth in your chest. This usually passes quickly. BEFORE  THE PROCEDURE Ask your health care provider about changing or stopping your regular medicines. PROCEDURE This procedure is usually done at a hospital and takes 2-4 hours.  An IV tube is inserted into one of your veins.  Your health care provider will inject a small amount of radioactive tracer through the tube.  You will then wait for 20-40 minutes while the tracer travels through your bloodstream.  You will lie down  on an exam table so images of your heart can be taken. Images will be taken for about 15-20 minutes.  You will exercise on a treadmill or stationary bike. While you exercise, your heart activity will be monitored with an electrocardiogram (ECG), and your blood pressure will be checked.  If you are unable to exercise, you may be given a medicine to make your heart beat faster.  When blood flow to your heart has peaked, tracer will again be injected through the IV tube.  After 20-40 minutes, you will get back on the exam table and have more images taken of your heart.  When the procedure is over, your IV tube will be removed. AFTER THE PROCEDURE  You will likely be able to leave shortly after the test. Unless your health care provider tells you otherwise, you may return to your normal schedule, including diet, activities, and medicines.  Make sure you find out how and when you will get your test results.   This information is not intended to replace advice given to you by your health care provider. Make sure you discuss any questions you have with your health care provider.   Document Released: 11/28/2004 Document Revised: 11/08/2013 Document Reviewed: 10/12/2013 Elsevier Interactive Patient Education [employer]2016 Elsevier Inc.

## 2016-09-23 ENCOUNTER — Encounter: Payer: Self-pay | Admitting: Internal Medicine

## 2016-09-29 ENCOUNTER — Ambulatory Visit
Admission: RE | Admit: 2016-09-29 | Discharge: 2016-09-29 | Disposition: A | Discharge status: Home / Self Care | Payer: Medicare Other | Source: Ambulatory Visit | Attending: Cardiovascular Disease | Admitting: Cardiovascular Disease

## 2016-09-29 DIAGNOSIS — I442 Atrioventricular block, complete: Secondary | ICD-10-CM

## 2016-09-29 DIAGNOSIS — R0602 Shortness of breath: Secondary | ICD-10-CM | POA: Diagnosis not present

## 2016-09-29 DIAGNOSIS — R079 Chest pain, unspecified: Secondary | ICD-10-CM

## 2016-09-29 DIAGNOSIS — I471 Supraventricular tachycardia, unspecified: Secondary | ICD-10-CM

## 2016-09-29 DIAGNOSIS — I10 Essential (primary) hypertension: Secondary | ICD-10-CM | POA: Diagnosis present

## 2016-09-29 LAB — NM MYOCAR MULTI W/SPECT W/WALL MOTION / EF
CHL CUP NUCLEAR SDS: 0
CSEPPHR: 80 {beats}/min
LVDIAVOL: 41 mL (ref 62–150)
LVSYSVOL: 8 mL
NUC STRESS TID: 0.76
Percent HR: 60 %
Rest HR: 62 {beats}/min
SRS: 4
SSS: 1

## 2016-09-29 MED ORDER — TECHNETIUM TC 99M TETROFOSMIN IV KIT
33.0000 | PACK | Freq: Once | INTRAVENOUS | Status: AC | PRN
  Administered 2016-09-29: 30.794 via INTRAVENOUS

## 2016-09-29 MED ORDER — REGADENOSON 0.4 MG/5ML IV SOLN
0.4000 mg | Freq: Once | INTRAVENOUS | Status: AC
Start: 2016-09-29 — End: 2016-09-29
  Administered 2016-09-29: 0.4 mg via INTRAVENOUS
  Filled 2016-09-29: qty 5

## 2016-09-29 MED ORDER — TECHNETIUM TC 99M TETROFOSMIN IV KIT
13.0000 | PACK | Freq: Once | INTRAVENOUS | Status: AC | PRN
  Administered 2016-09-29: 12.606 via INTRAVENOUS

## 2016-10-01 ENCOUNTER — Telehealth: Payer: Self-pay | Admitting: Cardiovascular Disease

## 2016-10-01 ENCOUNTER — Other Ambulatory Visit: Payer: Self-pay

## 2016-10-01 MED ORDER — RIVAROXABAN 20 MG PO TABS: 20.0000 mg | ORAL_TABLET | Freq: Every day | ORAL | 3 refills | Status: DC

## 2016-10-01 MED ORDER — LISINOPRIL 2.5 MG PO TABS: 2.5000 mg | ORAL_TABLET | Freq: Every day | ORAL | 3 refills | Status: DC

## 2016-10-01 MED ORDER — ATORVASTATIN CALCIUM 20 MG PO TABS: 20.0000 mg | ORAL_TABLET | Freq: Every day | ORAL | 3 refills | Status: DC

## 2016-10-01 NOTE — Telephone Encounter (Signed)
Reviewed preliminary results of stress test that report normal study. Let him know that results will be reviewed by Dr. Mariah MillingGollan and that he may receive another call with official results. He verbalized understanding with no further questions at this time.

## 2016-10-01 NOTE — Telephone Encounter (Signed)
Please review if can call in Desmopressin Acetate. Thanks!

## 2016-10-01 NOTE — Telephone Encounter (Signed)
Pt would also like Desmopressin Acetate 90 day supply on all these

## 2016-10-01 NOTE — Telephone Encounter (Signed)
Pt would like stress test results.  

## 2016-10-01 NOTE — Telephone Encounter (Signed)
Left voicemail message to call back regarding refills.

## 2016-10-01 NOTE — Telephone Encounter (Signed)
Patient called in and states that he needs a refill of Desmopressin 0.1 mg tablet that he took once daily at bedtime. This was ordered back on 11/21/14 and patient states that he needs it again. Let him know that I would need to check with Dr. Mariah MillingGollan because it has been discontinued in his chart and it has been a long time since he has been on it. He was appreciative for the call back and had no further questions at this time.

## 2016-10-07 ENCOUNTER — Other Ambulatory Visit: Payer: Self-pay

## 2016-10-07 MED ORDER — LANSOPRAZOLE 30 MG PO CPDR: 30.0000 mg | DELAYED_RELEASE_CAPSULE | Freq: Every day | ORAL | 3 refills | Status: DC

## 2016-10-14 ENCOUNTER — Ambulatory Visit: Payer: Medicare Other | Admitting: Cardiovascular Disease

## 2016-11-04 ENCOUNTER — Telehealth: Payer: Self-pay | Admitting: Cardiovascular Disease

## 2016-11-04 NOTE — Telephone Encounter (Signed)
Pt calling stating his heart was our of rhythm about Sunday afternoon  And he took his diltiazem  Now yesterday and today he is very tired and can't walk but 50 feet without getting real tired Would like some advise  Please call

## 2016-11-04 NOTE — Telephone Encounter (Signed)
Patient called in stating that he is not able to walk more than 150 feet before he gets so tired and just not able to walk any further. He states that this has happened before and it got better in a few days. He states that his blood pressures are good and currently it is 117/68 with heart rate of 73. Instructed him to make sure to stay hydrated and to avoid exertional activities for a few days and to call back if symptoms should worsen or if no improvement in a couple of days. He states that he needs to possibly come in and see Walter Townsend. He verbalized understanding of our conversation, agreement with plan, and had no further questions at this time. Sent him to scheduling to schedule follow up with Walter Townsend.

## 2016-11-18 ENCOUNTER — Telehealth: Payer: Self-pay | Admitting: Cardiovascular Disease

## 2016-11-18 NOTE — Telephone Encounter (Signed)
Spoke w/ patient who describes an "uncomfortable feeling, like something sticking in my left breast".  States he is unsure if this is related to his PPM or lung fibrosis. Pt advised to send in manual transmission to evaluate. New AFib noted in October w/ Graciela HusbandsKlein. Pt understands office will call him back once transmission reviewed.

## 2016-11-18 NOTE — Telephone Encounter (Signed)
Pt calling stating he has a pacemaker and is still having issues.  He is not sure to  He is stating he may need to go ED but is not sure He states he is having BP issues and issues with almost passing out States his heart is beating but not pumping like it is supposed to  Would like to know if he need to go ED or not Please advise  Also states he is having some pains now In his left breast

## 2016-11-18 NOTE — Telephone Encounter (Signed)
Spoke w/ pt.  He reports that he feels he is having an issue w/ his pacer, that he feels that something is sticking him from the inside of his left breast near the insertion site.   He has spells of SOB and has difficulty walking from 1 room to another w/o being winded. He has lung issues, as well and is uncertain if sx are cardiac related to lung. He would like to be worked in to see Dr. Graciela HusbandsKlein today, if possible.  Pt as advised that Dr. Graciela HusbandsKlein was out of the office; he asks is another EP doctor can see him today. Pt has pacer check on 1/9, but would like to see about being evaluated sooner. Advised pt that I am routing this message to Dr. Odessa FlemingKlein's nurse, that he should receive a [place] from [place]. He is agreeable to going to [place] if he can be seen today.

## 2016-11-18 NOTE — Telephone Encounter (Addendum)
Reviewed transmission report with device clinic - no concerning issues seen.   Upon further discussion with patient, he tells me that the pain he is experiencing is in the left breast area and began about 1 month ago - but states it is not around his PPM site. Describes pain as a "stinging pain".  Pt tells me that he has been using a leaf blower (that you strap on your back) for years, but it only started bothering him to use it this past year. He wonders if this is related to the pain in his breast. Patient asking if a lead might the problem  in relation to the symptom he is feeling.  Advised that I would send to Dr. Odessa FlemingKlein's nurse to address with him tomorrow when they are back in the office.  Informed that we could possibly get a xray to ensure proper placement of leads. (However, nothing on device reports seems to indicate lead issue).  He would like to have one done if [place] feels appropriate.  To Dr. Mariah MillingGollan to address: Reports SOB beginning about 2 weeks ago.  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 He is unsure if related to lung disease or heart. Denies any weight gain/swelling.  Reports low BPs after taking his morning medications.  Reports BP being as low as 80/50 and pt was symptomatic with extreme dizziness/weakness.  This morning is was 105/60.  This has been occurring for awhile now. Advised pt I would send to Dr. Mariah MillingGollan and his nurse to discuss possibly reducing/stopping medication for BP. Pt understands Dr. Mariah MillingGollan will address his BP concern and Graciela HusbandsKlein the breast pain.  He will keep appts next week with both doctors in [place] office.

## 2016-11-19 NOTE — Telephone Encounter (Signed)
I left a message for the patient on his identified voice mail that Dr. Graciela HusbandsKlein reviewed this message as well and thought that possibly repeating his echo may be helpful. No chest x-ray needed at this time. I asked that the patient call me back if he would like to proceed with the echo. Otherwise, he has follow up scheduled with Dr. Graciela HusbandsKlein on 11/25/16.

## 2016-11-19 NOTE — Telephone Encounter (Signed)
For low blood pressure, could hold lisinopril, drink extra fluids Continue to monitor numbers Unable to cut the diltiazem pill as this is gelcap Left chest pain likely muscular, would not use the leaf blower Try warm heat to the area for muscle cramps

## 2016-11-19 NOTE — Telephone Encounter (Signed)
Spoke with patient and he states that he is feeling some better today. Reviewed Dr. Windell HummingbirdGollan's recommendations that he can hold lisinopril and increase fluid intake to see if that helps. Instructed him to continue monitoring his blood pressures and to stop using the leaf blower for now. Also told him to try warm heat for muscle cramps. He verbalized understanding of all instructions and he confirmed his upcoming appointments next week. He had no further questions or concerns at this time. Let him know to call back if he needs any further assistance.

## 2016-11-25 ENCOUNTER — Encounter: Payer: Self-pay | Admitting: Internal Medicine

## 2016-11-25 ENCOUNTER — Encounter: Payer: Self-pay | Admitting: Cardiovascular Disease

## 2016-11-25 ENCOUNTER — Ambulatory Visit (INDEPENDENT_AMBULATORY_CARE_PROVIDER_SITE_OTHER): Payer: Medicare Other | Admitting: Cardiovascular Disease

## 2016-11-25 ENCOUNTER — Ambulatory Visit: Payer: Medicare Other | Admitting: Cardiovascular Disease

## 2016-11-25 ENCOUNTER — Ambulatory Visit (INDEPENDENT_AMBULATORY_CARE_PROVIDER_SITE_OTHER): Payer: Medicare Other | Admitting: Internal Medicine

## 2016-11-25 VITALS — BP 130/62 | HR 74 | Ht 68.0 in | Wt 168.5 lb

## 2016-11-25 VITALS — BP 130/62 | HR 74 | Ht 68.0 in | Wt 168.0 lb

## 2016-11-25 DIAGNOSIS — Z950 Presence of cardiac pacemaker: Secondary | ICD-10-CM | POA: Diagnosis not present

## 2016-11-25 DIAGNOSIS — E782 Mixed hyperlipidemia: Secondary | ICD-10-CM

## 2016-11-25 DIAGNOSIS — I442 Atrioventricular block, complete: Secondary | ICD-10-CM

## 2016-11-25 DIAGNOSIS — I471 Supraventricular tachycardia, unspecified: Secondary | ICD-10-CM

## 2016-11-25 DIAGNOSIS — Z45018 Encounter for adjustment and management of other part of cardiac pacemaker: Secondary | ICD-10-CM

## 2016-11-25 DIAGNOSIS — R001 Bradycardia, unspecified: Secondary | ICD-10-CM | POA: Diagnosis not present

## 2016-11-25 DIAGNOSIS — I10 Essential (primary) hypertension: Secondary | ICD-10-CM

## 2016-11-25 DIAGNOSIS — J8410 Pulmonary fibrosis, unspecified: Secondary | ICD-10-CM

## 2016-11-25 DIAGNOSIS — R351 Nocturia: Secondary | ICD-10-CM

## 2016-11-25 DIAGNOSIS — I480 Paroxysmal atrial fibrillation: Secondary | ICD-10-CM | POA: Diagnosis not present

## 2016-11-25 DIAGNOSIS — R079 Chest pain, unspecified: Secondary | ICD-10-CM

## 2016-11-25 DIAGNOSIS — I951 Orthostatic hypotension: Secondary | ICD-10-CM

## 2016-11-25 LAB — CUP PACEART INCLINIC DEVICE CHECK
Battery Voltage: 2.99 V
Date Time Interrogation Session: 20180109145057
Implantable Lead Implant Date: 20141010
Implantable Lead Location: 753859
Implantable Pulse Generator Implant Date: 20141010
Lead Channel Pacing Threshold Amplitude: 1.25 V
Lead Channel Pacing Threshold Pulse Width: 0.6 ms
Lead Channel Setting Pacing Amplitude: 1.5 V
Lead Channel Setting Pacing Pulse Width: 0.4 ms
Lead Channel Setting Sensing Sensitivity: 1 mV
MDC IDC LEAD IMPLANT DT: 20141010
MDC IDC LEAD LOCATION: 753860
MDC IDC MSMT LEADCHNL RA IMPEDANCE VALUE: 487.5 Ohm
MDC IDC MSMT LEADCHNL RA PACING THRESHOLD AMPLITUDE: 1 V
MDC IDC MSMT LEADCHNL RA SENSING INTR AMPL: 4.7 mV
MDC IDC MSMT LEADCHNL RV IMPEDANCE VALUE: 362.5 Ohm
MDC IDC MSMT LEADCHNL RV PACING THRESHOLD PULSEWIDTH: 0.4 ms
MDC IDC MSMT LEADCHNL RV SENSING INTR AMPL: 2.7 mV
MDC IDC SET LEADCHNL RA PACING AMPLITUDE: 2 V
MDC IDC STAT BRADY RA PERCENT PACED: 38 %
MDC IDC STAT BRADY RV PERCENT PACED: 0.58 %
Pulse Gen Model: 2240
Pulse Gen Serial Number: 2980518

## 2016-11-25 MED ORDER — DESMOPRESSIN ACETATE 0.1 MG PO TABS: 0.1000 mg | ORAL_TABLET | Freq: Every day | ORAL | 1 refills | Status: DC

## 2016-11-25 NOTE — Patient Instructions (Addendum)
We will refer you to Dr. Sung AmabileSimonds ([facility])  for pulmonary fibrosis   Medication Instructions:   Please take desmopressin before bed  Labwork:  No new labs needed  Testing/Procedures:  No further testing at this time   I recommend watching educational videos on topics of interest to you at:       [email or web address]  Enter code: [email or web address]    Follow-Up: It was a pleasure seeing you in the office today. Please call us if you have new issues that need to be addressed before your next appt.  [phone]  Your physician wants you to follow-up in: 6 months.  You will receive a reminder letter in the mail two months in advance. If you don't receive a letter, please call our office to schedule the follow-up appointment.  If you need a refill on your cardiac medications before your next appointment, please call your pharmacy.

## 2016-11-25 NOTE — Patient Instructions (Signed)
Medication Instructions: - Your physician recommends that you continue on your current medications as directed. Please refer to the Current Medication list given to you today.  Labwork: - none ordered  Procedures/Testing: - none ordered  Follow-Up: - Your physician wants you to follow-up in: October 2018 with Dr. Graciela HusbandsKlein. You will receive a reminder letter in the mail two months in advance. If you don't receive a letter, please call our office to schedule the follow-up appointment.   Any Additional Special Instructions Will Be Listed Below (If Applicable).     If you need a refill on your cardiac medications before your next appointment, please call your pharmacy.  s

## 2016-11-25 NOTE — Progress Notes (Signed)
Patient Care Team: Rafael Bihari, MD as PCP - General (Unknown Physician Specialty) Antonieta Iba, MD as Consulting Physician (Cardiology)   HPI  Walter Townsend is a 83 y.o. male Seen following pacemaker implantation 10/14 for symptomatic high-grade heart block . Echo at that time demonstrated normal left ventricular function.  There was improved exercise intolerance   He also has had atrial fibrillation for which we tried apixoban but not tolerated 2/2 GI effects.  Rivaroxaban initiated and tolerated;  No bleeding    continues with orthostatic intolerance primarily post prandial  Has had some tachypalpitations. He measured VS  BP 75 and HR 100 or so  Also struggles with intermittent DOE    Records reviewed   Myoview  Low risk  Thromboembolic risk profile notable for age-83, hypertension-1 for a CHADS-VASc score of 3   7/17 Cr 1.0 (Care Everywhere)  Past Medical History:  Diagnosis Date  . Cardiac pacemaker st Judes   . Hyperlipidemia   . Hypertension   . Orthostatic hypotension 08/22/2014  . PAF (paroxysmal atrial fibrillation) (HCC)   . Prostate enlargement   . Pulmonary fibrosis (HCC)     Past Surgical History:  Procedure Laterality Date  . BACK SURGERY    . FOOT SURGERY    . HERNIA REPAIR    . INSERT / REPLACE / REMOVE PACEMAKER    . PACEMAKER INSERTION  08/26/2013   St. Jude Assurity DDD pacemaker  . PERMANENT PACEMAKER INSERTION N/A 08/26/2013   Procedure: PERMANENT PACEMAKER INSERTION;  Surgeon: Marinus Maw, MD;  Location: [facility] CATH LAB;  Service: Cardiovascular;  Laterality: N/A;  . TEMPORARY PACEMAKER INSERTION N/A 08/25/2013   Procedure: TEMPORARY PACEMAKER INSERTION;  Surgeon: Lesleigh Noe, MD;  Location: [facility] CATH LAB;  Service: Cardiovascular;  Laterality: N/A;    Current Outpatient Prescriptions  Medication Sig Dispense Refill  . atorvastatin (LIPITOR) 20 MG tablet Take 1 tablet (20 mg total) by mouth daily. 90 tablet 3  . Calcium  Carbonate-Vitamin D (CALCIUM-VITAMIN D) 600-200 MG-UNIT CAPS Take 1 capsule by mouth daily.      . cyanocobalamin 100 MCG tablet Take 100 mcg by mouth daily.    Marland Kitchen diltiazem (CARDIZEM) 30 MG tablet Take 30 mg by mouth 3 (three) times daily.    Marland Kitchen docusate sodium (COLACE) 100 MG capsule Take 100 mg by mouth at bedtime as needed for constipation.    . finasteride (PROSCAR) 5 MG tablet Take 1 tablet (5 mg total) by mouth daily.    . Garlic Oil 1000 MG CAPS Take 1 capsule by mouth daily.      Marland Kitchen glucosamine-chondroitin 500-400 MG tablet Take 1 tablet by mouth daily.      . lansoprazole (PREVACID) 30 MG capsule Take 1 capsule (30 mg total) by mouth daily. 90 capsule 3  . lisinopril (ZESTRIL) 2.5 MG tablet Take 1 tablet (2.5 mg total) by mouth daily. (Patient taking differently: Take 2.5 mg by mouth daily as needed. ) 90 tablet 3  . Multiple Vitamin (MULTIVITAMIN) tablet Take 1 tablet by mouth daily.      . rivaroxaban (XARELTO) 20 MG TABS tablet Take 1 tablet (20 mg total) by mouth daily with supper. 90 tablet 3  . saw palmetto 160 MG capsule Take 160 mg by mouth daily.       No current facility-administered medications for this visit.     No Known Allergies  Review of Systems negative except from HPI and PMH  Physical Exam BP 130/62 (BP Location: Left Arm, Patient Position: Sitting, Cuff Size: Normal)   Pulse 74   Ht 5\' 8"  (1.727 m)   Wt 168 lb 8 oz (76.4 kg)   BMI 25.62 kg/m  Well developed and nourished in no acute distress HENT normal Neck supple with JVP-flat Clear Regular rate and rhythm, no murmurs or gallops Abd-soft with active BS No Clubbing cyanosis edema Skin-warm and dry A & Oriented  Grossly normal sensory and motor function  ECG demonstrates sinus rhythm with right bundle branch block and left axis deviation Intervals 17/15/42   Assessment and  Plan  Hypertension     Orthostatic hypotension/postprandial   Complete heart block -intermittent  stable post  pacing  Sinus bradycardia  Pacemaker St Jude   The patient's device was interrogated.  The information was reviewed. No changes were made in the programming.     Atrial fibrillation  DOE and hypotension   PMT     The PMT may explain the symptoms of LH and DOE assoc with hypotension and HR aobut 100.  We have reprogrammed his PVARP to eliminate this.  If there are recurrent issues, could reprogram DDI  I have suggested it would be reasonable to resume dilt 120  And take it at night   We reviewed the physiology of post prandial hypotension and have suggested that he try and take his protein supplement with his evening meal  On Anticoagulation;  No bleeding issues    BP well controlled  HR excursion reasonable
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
## 2016-12-02 ENCOUNTER — Ambulatory Visit (INDEPENDENT_AMBULATORY_CARE_PROVIDER_SITE_OTHER): Payer: Medicare Other | Admitting: *Deleted

## 2016-12-02 DIAGNOSIS — I442 Atrioventricular block, complete: Secondary | ICD-10-CM | POA: Diagnosis not present

## 2016-12-02 NOTE — Progress Notes (Signed)
Remote pacemaker transmission.   

## 2016-12-05 ENCOUNTER — Other Ambulatory Visit: Payer: Self-pay | Admitting: Internal Medicine

## 2016-12-06 LAB — CUP PACEART REMOTE DEVICE CHECK
Battery Remaining Longevity: 121 mo
Battery Voltage: 2.99 V
Brady Statistic AP VS Percent: 33 %
Brady Statistic AS VP Percent: 1 %
Brady Statistic RA Percent Paced: 32 %
Brady Statistic RV Percent Paced: 1 %
Implantable Lead Implant Date: 20141010
Implantable Lead Implant Date: 20141010
Implantable Pulse Generator Implant Date: 20141010
Lead Channel Pacing Threshold Amplitude: 1 V
Lead Channel Pacing Threshold Amplitude: 1.25 V
Lead Channel Pacing Threshold Pulse Width: 0.6 ms
Lead Channel Sensing Intrinsic Amplitude: 2.4 mV
Lead Channel Setting Pacing Amplitude: 2 V
Lead Channel Setting Pacing Pulse Width: 0.4 ms
Lead Channel Setting Sensing Sensitivity: 1 mV
MDC IDC LEAD LOCATION: 753859
MDC IDC LEAD LOCATION: 753860
MDC IDC MSMT BATTERY REMAINING PERCENTAGE: 95.5 %
MDC IDC MSMT LEADCHNL RA IMPEDANCE VALUE: 510 Ohm
MDC IDC MSMT LEADCHNL RA SENSING INTR AMPL: 4.8 mV
MDC IDC MSMT LEADCHNL RV IMPEDANCE VALUE: 380 Ohm
MDC IDC MSMT LEADCHNL RV PACING THRESHOLD PULSEWIDTH: 0.4 ms
MDC IDC SESS DTM: 20180116070014
MDC IDC SET LEADCHNL RV PACING AMPLITUDE: 2.5 V
MDC IDC STAT BRADY AP VP PERCENT: 1 %
MDC IDC STAT BRADY AS VS PERCENT: 67 %
Pulse Gen Model: 2240
Pulse Gen Serial Number: 2980518

## 2016-12-08 ENCOUNTER — Other Ambulatory Visit: Payer: Self-pay | Admitting: Internal Medicine

## 2016-12-08 DIAGNOSIS — R945 Abnormal results of liver function studies: Secondary | ICD-10-CM

## 2016-12-08 DIAGNOSIS — R7989 Other specified abnormal findings of blood chemistry: Secondary | ICD-10-CM

## 2016-12-10 ENCOUNTER — Encounter: Payer: Self-pay | Admitting: Cardiology

## 2016-12-10 ENCOUNTER — Other Ambulatory Visit: Payer: Self-pay | Admitting: Internal Medicine

## 2016-12-10 ENCOUNTER — Ambulatory Visit
Admission: RE | Admit: 2016-12-10 | Discharge: 2016-12-10 | Disposition: A | Discharge status: Home / Self Care | Payer: Medicare Other | Source: Ambulatory Visit | Attending: Internal Medicine | Admitting: Internal Medicine

## 2016-12-10 DIAGNOSIS — R945 Abnormal results of liver function studies: Secondary | ICD-10-CM

## 2016-12-10 DIAGNOSIS — K7689 Other specified diseases of liver: Secondary | ICD-10-CM | POA: Insufficient documentation

## 2016-12-10 DIAGNOSIS — R7989 Other specified abnormal findings of blood chemistry: Secondary | ICD-10-CM | POA: Insufficient documentation

## 2016-12-29 DIAGNOSIS — R748 Abnormal levels of other serum enzymes: Secondary | ICD-10-CM | POA: Insufficient documentation

## 2016-12-29 DIAGNOSIS — K7689 Other specified diseases of liver: Secondary | ICD-10-CM | POA: Insufficient documentation

## 2017-01-15 ENCOUNTER — Ambulatory Visit (INDEPENDENT_AMBULATORY_CARE_PROVIDER_SITE_OTHER): Payer: Medicare Other | Admitting: Pulmonary Disease

## 2017-01-15 ENCOUNTER — Other Ambulatory Visit: Payer: Self-pay | Admitting: *Deleted

## 2017-01-15 ENCOUNTER — Ambulatory Visit
Admission: RE | Admit: 2017-01-15 | Discharge: 2017-01-15 | Disposition: A | Discharge status: Home / Self Care | Payer: Medicare Other | Source: Ambulatory Visit | Attending: Pulmonary Disease | Admitting: Pulmonary Disease

## 2017-01-15 ENCOUNTER — Encounter: Payer: Self-pay | Admitting: Pulmonary Disease

## 2017-01-15 VITALS — BP 142/78 | HR 62 | Wt 169.0 lb

## 2017-01-15 DIAGNOSIS — Z950 Presence of cardiac pacemaker: Secondary | ICD-10-CM | POA: Diagnosis not present

## 2017-01-15 DIAGNOSIS — M8580 Other specified disorders of bone density and structure, unspecified site: Secondary | ICD-10-CM | POA: Insufficient documentation

## 2017-01-15 DIAGNOSIS — J8410 Pulmonary fibrosis, unspecified: Secondary | ICD-10-CM

## 2017-01-15 DIAGNOSIS — R0609 Other forms of dyspnea: Secondary | ICD-10-CM

## 2017-01-15 DIAGNOSIS — R0902 Hypoxemia: Secondary | ICD-10-CM | POA: Diagnosis not present

## 2017-01-15 NOTE — Patient Instructions (Signed)
Continue your current efforts at exercise and other good health habits  Follow up in 6 months. We will re-assess whether you need oxygen therapy at that time

## 2017-01-16 NOTE — Progress Notes (Signed)
PULMONARY CONSULT NOTE  Requesting MD/Service: Mariah MillingGollan Date of initial consultation: 01/15/2017 Reason for consultation: Pulmonary fibrosis  PT PROFILE: 83 y.o. M remote smoker with slowly progressive pulmonary fibrosis and CT chest pattern c/w UIP/IPF. Previously followed by Dr Kendrick FriesMcQuaid  DATA: 10/13/2011 CT chest: Pattern consistent with mild UIP 10/17/2011 PFTs: No obstruction, normal TLC and DLCO  HPI:  This is an 83 year old gentleman who was previously followed by Dr. Kendrick FriesMcQuaid for pulmonary fibrosis, presumed IPF. He is referred back to me by Dr. Mariah MillingGollan for gradually progressive exertional dyspnea. His shortness of breath has progressed inexorably over the last 6 years. He now reports feeling short of breath after walking out to the mailbox and back noting that he has to walk up a mild incline on the way back to the house. There is no day-to-day or seasonal variation in his symptoms. He has a home pulse oximeter and notes that his oxygen saturations will decrease to as low as 88% after ambulation. They recover back into the 90s within minutes. Denies CP, fever, cough, purulent sputum, hemoptysis, LE edema and calf tenderness.  Past Medical History:  Diagnosis Date  . Cardiac pacemaker st Judes   . Hyperlipidemia   . Hypertension   . Orthostatic hypotension 08/22/2014  . PAF (paroxysmal atrial fibrillation) (HCC)   . Prostate enlargement   . Pulmonary fibrosis (HCC)     Past Surgical History:  Procedure Laterality Date  . BACK SURGERY    . FOOT SURGERY    . HERNIA REPAIR    . INSERT / REPLACE / REMOVE PACEMAKER    . PACEMAKER INSERTION  08/26/2013   St. Jude Assurity DDD pacemaker  . PERMANENT PACEMAKER INSERTION N/A 08/26/2013   Procedure: PERMANENT PACEMAKER INSERTION;  Surgeon: Marinus MawGregg W Taylor, MD;  [facility];  Service: Cardiovascular;  Laterality: N/A;  . TEMPORARY PACEMAKER INSERTION N/A 08/25/2013   Procedure: TEMPORARY PACEMAKER INSERTION;  Surgeon: Lesleigh NoeHenry W  Smith III, MD;  [facility];  Service: Cardiovascular;  Laterality: N/A;    MEDICATIONS: I have reviewed all medications and confirmed regimen as documented  Social History   Social History  . Marital status: Married    Spouse name: N/A  . Number of children: N/A  . Years of education: N/A   Occupational History  . Not on file.   Social History Main Topics  . Smoking status: Former Smoker    Packs/day: 0.30    Years: 3.00    Types: Cigarettes    Quit date: 11/17/1948  . Smokeless tobacco: Never Used  . Alcohol use No  . Drug use: No  . Sexual activity: No   Other Topics Concern  . Not on file   Social History Narrative   Mr. 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  He as never used pesticides.  His house is not moldy, he has no pets.      Family History  Problem Relation Age of Onset  . Heart disease Father   . Heart attack Brother   . Heart attack Sister   . Heart attack Brother   . Heart failure Brother   . Heart attack Brother   . Heart attack Brother   . Cancer Brother  skin  . Heart disease Other   . Coronary artery disease Other   . Heart disease Son 33    stent placement     ROS: No fever, myalgias/arthralgias, unexplained weight loss or weight gain No new focal weakness or sensory deficits No otalgia, hearing loss, visual changes, nasal and sinus symptoms, mouth and throat problems No neck pain or adenopathy No abdominal pain, N/V/D, diarrhea, change in bowel pattern No dysuria, change in urinary pattern   Vitals:   01/15/17 1104  BP: (!) 142/78  Pulse: 62  SpO2: 96%  Weight: 169 lb (76.7 kg)  Room air   EXAM:  Gen: WDWN, Appears much younger than history of age, No overt respiratory distress HEENT:  NCAT, sclera white, oropharynx normal Neck: Supple without LAN, thyromegaly, JVD Lungs: breath sounds: Full, percussion: Normal, bilateral diffuse crackles most prominently in the bases, no wheezes Cardiovascular: RRR, no murmurs noted Abdomen: Soft, nontender, normal BS Ext: without clubbing, cyanosis, edema Neuro: CNs grossly intact, motor and sensory intact Skin: Limited exam, no lesions noted  DATA:   BMP Latest Ref Rng & Units 09/02/2016 09/05/2014 08/26/2013  Glucose 65 - 99 mg/dL 409(W) 119(J) 478(G)  BUN 8 - 27 mg/dL 19 15 12   Creatinine 0.76 - 1.27 mg/dL 9.56 2.13 0.86  BUN/Creat Ratio 10 - 24 20 14  -  Sodium 134 - 144 mmol/L 138 138 139  Potassium 3.5 - 5.2 mmol/L 4.8 4.7 4.1  Chloride 96 - 106 mmol/L 100 99 103  CO2 18 - 29 mmol/L 24 19 24   Calcium 8.6 - 10.2 mg/dL 9.1 9.3 9.2    CBC Latest Ref Rng & Units 08/25/2013 09/21/2008  WBC 4.0 - 10.5 K/uL 9.2 -  Hemoglobin 13.0 - 17.0 g/dL 57.8 46.9  Hematocrit 62.9 - 52.0 % 42.5 42.0  Platelets 150 - 400 K/uL 208 -    CXR (01/15/2017):  Increased interstitial markings compared to previous film from 2014 consistent with progressive pulmonary fibrosis  IMPRESSION:     ICD-9-CM ICD-10-CM   1. DOE (dyspnea on exertion) 786.09 R06.09   2. Pulmonary fibrosis (HCC) 515 J84.10   3. Borderline hypoxemia with exertion 799.02 R09.02    His progressive exertional dyspnea is due to gradually progressive on fibrosis. This is most likely idiopathic pulmonary fibrosis. He wishes to avoid more aggressive evaluation including repeat pulmonary function tests. We discussed the possibility of initiating oxygen therapy at this time, although it is not absolutely indicated presently. He wishes to avoid starting that at this time.  PLAN:  No specific therapies are rendered No further evaluation undertaken at this time He will follow-up with me in 6 months to reassess his oxygen needs. He understands that at some point it is likely that I will  recommend that we initiate oxygen therapy.   Billy Fischer, MD PCCM service Mobile [phone] Pager [phone] 01/16/2017

## 2017-02-09 ENCOUNTER — Emergency Department: Payer: Medicare Other

## 2017-02-09 ENCOUNTER — Encounter: Payer: Self-pay | Admitting: Emergency Medicine

## 2017-02-09 ENCOUNTER — Emergency Department
Admission: EM | Admit: 2017-02-09 | Discharge: 2017-02-09 | Disposition: A | Discharge status: Home / Self Care | Payer: Medicare Other | Attending: Emergency Medicine | Admitting: Emergency Medicine

## 2017-02-09 DIAGNOSIS — Z87891 Personal history of nicotine dependence: Secondary | ICD-10-CM | POA: Diagnosis not present

## 2017-02-09 DIAGNOSIS — R42 Dizziness and giddiness: Secondary | ICD-10-CM

## 2017-02-09 DIAGNOSIS — I10 Essential (primary) hypertension: Secondary | ICD-10-CM | POA: Diagnosis not present

## 2017-02-09 LAB — COMPREHENSIVE METABOLIC PANEL
ALT: 34 U/L (ref 17–63)
AST: 33 U/L (ref 15–41)
Albumin: 3.8 g/dL (ref 3.5–5.0)
Alkaline Phosphatase: 171 U/L — ABNORMAL HIGH (ref 38–126)
Anion gap: 7 (ref 5–15)
BUN: 17 mg/dL (ref 6–20)
CHLORIDE: 105 mmol/L (ref 101–111)
CO2: 25 mmol/L (ref 22–32)
Calcium: 9.2 mg/dL (ref 8.9–10.3)
Creatinine, Ser: 0.92 mg/dL (ref 0.61–1.24)
Glucose, Bld: 138 mg/dL — ABNORMAL HIGH (ref 65–99)
POTASSIUM: 4.3 mmol/L (ref 3.5–5.1)
Sodium: 137 mmol/L (ref 135–145)
Total Bilirubin: 0.9 mg/dL (ref 0.3–1.2)
Total Protein: 7.9 g/dL (ref 6.5–8.1)

## 2017-02-09 LAB — CBC WITH DIFFERENTIAL/PLATELET
BASOS ABS: 0.1 10*3/uL (ref 0–0.1)
Basophils Relative: 1 %
EOS PCT: 4 %
Eosinophils Absolute: 0.4 10*3/uL (ref 0–0.7)
HCT: 40.2 % (ref 40.0–52.0)
Hemoglobin: 13.8 g/dL (ref 13.0–18.0)
LYMPHS ABS: 3.2 10*3/uL (ref 1.0–3.6)
Lymphocytes Relative: 33 %
MCH: 31.2 pg (ref 26.0–34.0)
MCHC: 34.4 g/dL (ref 32.0–36.0)
MCV: 90.8 fL (ref 80.0–100.0)
MONO ABS: 1.2 10*3/uL — AB (ref 0.2–1.0)
Monocytes Relative: 12 %
Neutro Abs: 4.9 10*3/uL (ref 1.4–6.5)
Neutrophils Relative %: 50 %
PLATELETS: 216 10*3/uL (ref 150–440)
RBC: 4.42 MIL/uL (ref 4.40–5.90)
RDW: 13.9 % (ref 11.5–14.5)
WBC: 9.7 10*3/uL (ref 3.8–10.6)

## 2017-02-09 LAB — URINALYSIS, COMPLETE (UACMP) WITH MICROSCOPIC
BILIRUBIN URINE: NEGATIVE
Bacteria, UA: NONE SEEN
Glucose, UA: NEGATIVE mg/dL
HGB URINE DIPSTICK: NEGATIVE
Ketones, ur: NEGATIVE mg/dL
Leukocytes, UA: NEGATIVE
Nitrite: NEGATIVE
Protein, ur: NEGATIVE mg/dL
SPECIFIC GRAVITY, URINE: 1.013 (ref 1.005–1.030)
pH: 8 (ref 5.0–8.0)

## 2017-02-09 LAB — TROPONIN I

## 2017-02-09 MED ORDER — DILTIAZEM HCL 30 MG PO TABS
30.0000 mg | ORAL_TABLET | Freq: Once | ORAL | Status: AC
  Administered 2017-02-09: 30 mg via ORAL
  Filled 2017-02-09 (×2): qty 1

## 2017-02-09 MED ORDER — DIAZEPAM 5 MG PO TABS
5.0000 mg | ORAL_TABLET | Freq: Once | ORAL | Status: AC
  Administered 2017-02-09: 5 mg via ORAL
  Filled 2017-02-09: qty 1

## 2017-02-09 MED ORDER — DIAZEPAM 5 MG PO TABS: 5.0000 mg | ORAL_TABLET | Freq: Three times a day (TID) | ORAL | 0 refills | Status: DC | PRN

## 2017-02-09 MED ORDER — MECLIZINE HCL 25 MG PO TABS: 25.0000 mg | ORAL_TABLET | Freq: Three times a day (TID) | ORAL | 1 refills | Status: DC | PRN

## 2017-02-09 MED ORDER — SODIUM CHLORIDE 0.9 % IV SOLN
Freq: Once | INTRAVENOUS | Status: AC
  Administered 2017-02-09: 10:00:00 via INTRAVENOUS

## 2017-02-09 MED ORDER — ONDANSETRON HCL 4 MG/2ML IJ SOLN
4.0000 mg | Freq: Once | INTRAMUSCULAR | Status: AC
  Administered 2017-02-09: 4 mg via INTRAVENOUS
  Filled 2017-02-09: qty 2

## 2017-02-09 MED ORDER — MECLIZINE HCL 25 MG PO TABS
50.0000 mg | ORAL_TABLET | Freq: Once | ORAL | Status: AC
  Administered 2017-02-09: 50 mg via ORAL
  Filled 2017-02-09: qty 2

## 2017-02-09 NOTE — ED Provider Notes (Signed)
[facility] Emergency Department Provider Note        Time seen: ----------------------------------------- 9:12 AM on 02/09/2017 -----------------------------------------    I have reviewed the triage vital signs and the nursing notes.   HISTORY   Chief Complaint Chest Pain    HPI Walter Townsend is a 83 y.o. male who presents to the ED for dizziness upon sitting up this morning. By dizziness he describes room spinning sensation and feeling like he is falling to the left. He denies a history of this before. He has had intense nausea, was noted to be hypertensive in route by EMS. He denies fevers, chills or other complaints.   Past Medical History:  Diagnosis Date  . Cardiac pacemaker st Judes   . Hyperlipidemia   . Hypertension   . Orthostatic hypotension 08/22/2014  . PAF (paroxysmal atrial fibrillation) (HCC)   . Prostate enlargement   . Pulmonary fibrosis [facility])     Patient Active Problem List   Diagnosis Date Noted  . Paroxysmal atrial fibrillation (HCC) 09/22/2016  . Chest pain at rest 02/06/2016  . Excessive urination at night 11/21/2014  . Orthostatic hypotension 08/22/2014  . Heat stroke 07/07/2014  . Pacemaker -St. Jude 11/29/2013  . 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 Reflux 10/27/2011  . Cough 10/27/2011  . Pulmonary fibrosis (HCC) 10/06/2011  . SOB (shortness of breath) 09/25/2011  . SVT/ PSVT/ PAT 06/20/2010  . Hyperlipidemia 02/06/2010  . Essential hypertension 02/06/2010    Past Surgical History:  Procedure Laterality Date  . BACK SURGERY    . FOOT SURGERY    . HERNIA REPAIR    . INSERT / REPLACE / REMOVE PACEMAKER    . PACEMAKER INSERTION  08/26/2013   St. Jude Assurity DDD pacemaker  . PERMANENT PACEMAKER INSERTION N/A 08/26/2013   Procedure: PERMANENT PACEMAKER INSERTION;  Surgeon: Marinus Maw, MD;  Location: [facility] C[facility] LAB;  Service: Cardiovascular;  Laterality: N/A;  . TEMPORARY  PACEMAKER INSERTION N/A 08/25/2013   Procedure: TEMPORARY PACEMAKER INSERTION;  Surgeon: Lesleigh Noe, MD;  Location: [facility] CATH LAB;  Service: Cardiovascular;  Laterality: N/A;    Allergies Patient has no known allergies.  Social History Social History  Substance Use Topics  . Smoking status: Former Smoker    Packs/day: 0.30    Years: 3.00    Types: Cigarettes    Quit date: 11/17/1948  . Smokeless tobacco: Never Used  . Alcohol use No    Review of Systems Constitutional: Negative for fever. Cardiovascular: Negative for chest pain. Respiratory: Negative for shortness of breath. Gastrointestinal: Negative for abdominal pain, positive for nausea Genitourinary: Negative for dysuria. Musculoskeletal: Negative for back pain. Skin: Negative for rash. Neurological: Negative for headaches, focal weakness or numbness. Positive for vertigo  10-point ROS otherwise negative.  ____________________________________________   PHYSICAL EXAM:  VITAL SIGNS: ED Triage Vitals  Enc Vitals Group     BP 02/09/17 0910 (!) 199/76     Pulse Rate 02/09/17 0910 60     Resp 02/09/17 0910 18     Temp 02/09/17 0910 97.5 F (36.4 C)     Temp Source 02/09/17 0910 Oral     SpO2 02/09/17 0910 99 %     Weight 02/09/17 0911 160 lb (72.6 kg)     Height 02/09/17 0911 5\' 8"  (1.727 m)     Head Circumference --      Peak Flow --      Pain Score --  Pain Loc --      Pain Edu? --      Excl. in GC? --     Constitutional: Alert and oriented. Mild distress Eyes: Conjunctivae are normal. PERRL. Normal extraocular movements. No nystagmus ENT   Head: Normocephalic and atraumatic.   Nose: No congestion/rhinnorhea.   Mouth/Throat: Mucous membranes are moist.   Neck: No stridor. Cardiovascular: Normal rate, regular rhythm. No murmurs, rubs, or gallops. Respiratory: Normal respiratory effort without tachypnea nor retractions. Breath sounds are clear and equal bilaterally. No  wheezes/rales/rhonchi. Gastrointestinal: Soft and nontender. Normal bowel sounds Musculoskeletal: Nontender with normal range of motion in extremities. No lower extremity tenderness nor edema. Neurologic:  Normal speech and language. No gross focal neurologic deficits are appreciated.  Skin:  Skin is warm, dry and intact. No rash noted. Psychiatric: Mood and affect are normal. Speech and behavior are normal.  ____________________________________________  EKG: Interpreted by me. Atrial paced rhythm with a rate of 60 bpm, right bundle branch block pattern  ____________________________________________  ED COURSE:  Pertinent labs & imaging results that were available during my care of the patient were reviewed by me and considered in my medical decision making (see chart for details). Patient presents for vertigo, we will assess with labs and imaging as indicated Clinical Course as of Feb 10 1228  Mon Feb 09, 2017  1229 Patient clinically with peripheral vertigo and currently has resolution of his symptoms. He is able to ambulate here without significant abnormality. He does have chronic gait disturbance.  [JW]    Clinical Course User Index [JW] Walter FilbertJonathan Townsend Ja Ohman, MD   Procedures ____________________________________________   LABS (pertinent positives/negatives)  Labs Reviewed  CBC WITH DIFFERENTIAL/PLATELET - Abnormal; Notable for the following:       Result Value   Monocytes Absolute 1.2 (*)    All other components within normal limits  COMPREHENSIVE METABOLIC PANEL - Abnormal; Notable for the following:    Glucose, Bld 138 (*)    Alkaline Phosphatase 171 (*)    All other components within normal limits  URINALYSIS, COMPLETE (UACMP) WITH MICROSCOPIC - Abnormal; Notable for the following:    Color, Urine YELLOW (*)    APPearance HAZY (*)    Squamous Epithelial / LPF 0-5 (*)    All other components within normal limits  TROPONIN I    RADIOLOGY Images were viewed by  me  CT head IMPRESSION: No acute abnormality.  Mild atrophy and chronic microvascular ischemic change.  Atherosclerosis.  Mild ethmoid air cell disease. ____________________________________________  FINAL ASSESSMENT AND PLAN  Vertigo  Plan: Patient's labs and imaging were dictated above. Patient had presented for Peripheral vertigo symptoms and is currently improving. He is able to ambulate as dictated above. Tests were reassuring, he'll be discharged with meclizine and Valium and referred to ENT for follow-up.   Walter Townsend, Walter Sakamoto E, MD   Note: This note was generated in part or whole with voice recognition software. Voice recognition is usually quite accurate but there are transcription errors that can and very often do occur. I apologize for any typographical errors that were not detected and corrected.     Walter FilbertJonathan Townsend Tawania Daponte, MD 02/09/17 1230

## 2017-02-09 NOTE — ED Notes (Signed)
Patient does not feel up to walking at this time.  Discussed the need to ambulate patient prior to discharge.  Patient verbalized understanding.

## 2017-02-09 NOTE — ED Notes (Signed)
Patient ambulated with walker down hallway and back to bed without difficulty.  Patient states, "I feel a little woozy but much better than I did."  Patient also got up to the restroom as well without issue.

## 2017-02-09 NOTE — ED Triage Notes (Signed)
Woke with dizziness this, no previous history of same, denies chest pain.

## 2017-03-03 ENCOUNTER — Ambulatory Visit (INDEPENDENT_AMBULATORY_CARE_PROVIDER_SITE_OTHER): Payer: Medicare Other | Admitting: *Deleted

## 2017-03-03 DIAGNOSIS — I442 Atrioventricular block, complete: Secondary | ICD-10-CM

## 2017-03-03 NOTE — Progress Notes (Signed)
Remote pacemaker transmission.   

## 2017-03-05 LAB — CUP PACEART REMOTE DEVICE CHECK
Battery Remaining Longevity: 118 mo
Battery Remaining Percentage: 95.5 %
Brady Statistic AP VP Percent: 1 %
Brady Statistic AP VS Percent: 38 %
Brady Statistic AS VP Percent: 1 %
Implantable Lead Implant Date: 20141010
Implantable Lead Location: 753859
Lead Channel Impedance Value: 360 Ohm
Lead Channel Impedance Value: 450 Ohm
Lead Channel Pacing Threshold Amplitude: 1.25 V
Lead Channel Pacing Threshold Pulse Width: 0.4 ms
Lead Channel Sensing Intrinsic Amplitude: 2.4 mV
Lead Channel Sensing Intrinsic Amplitude: 4.4 mV
Lead Channel Setting Pacing Amplitude: 2 V
Lead Channel Setting Pacing Amplitude: 2.5 V
Lead Channel Setting Pacing Pulse Width: 0.4 ms
MDC IDC LEAD IMPLANT DT: 20141010
MDC IDC LEAD LOCATION: 753860
MDC IDC MSMT BATTERY VOLTAGE: 2.99 V
MDC IDC MSMT LEADCHNL RA PACING THRESHOLD AMPLITUDE: 1 V
MDC IDC MSMT LEADCHNL RA PACING THRESHOLD PULSEWIDTH: 0.6 ms
MDC IDC PG IMPLANT DT: 20141010
MDC IDC SESS DTM: 20180417060015
MDC IDC SET LEADCHNL RV SENSING SENSITIVITY: 1 mV
MDC IDC STAT BRADY AS VS PERCENT: 61 %
MDC IDC STAT BRADY RA PERCENT PACED: 38 %
MDC IDC STAT BRADY RV PERCENT PACED: 1 %
Pulse Gen Serial Number: 2980518

## 2017-03-06 ENCOUNTER — Encounter: Payer: Self-pay | Admitting: Cardiology

## 2017-03-30 ENCOUNTER — Telehealth: Payer: Self-pay

## 2017-03-30 NOTE — Telephone Encounter (Signed)
Spoke with pt, [facility] with device clinic

## 2017-03-30 NOTE — Telephone Encounter (Signed)
-----   Message from Duke SalviaSteven C Klein, MD sent at 03/29/2017  3:31 PM EDT ----- Remote reviewed. This remote is abnormal for PMTwill need PVARP reprogrammed

## 2017-04-14 ENCOUNTER — Telehealth: Payer: Self-pay | Admitting: *Deleted

## 2017-04-14 ENCOUNTER — Ambulatory Visit (INDEPENDENT_AMBULATORY_CARE_PROVIDER_SITE_OTHER): Payer: Medicare Other | Admitting: *Deleted

## 2017-04-14 DIAGNOSIS — I442 Atrioventricular block, complete: Secondary | ICD-10-CM | POA: Diagnosis not present

## 2017-04-14 DIAGNOSIS — Z45018 Encounter for adjustment and management of other part of cardiac pacemaker: Secondary | ICD-10-CM

## 2017-04-14 DIAGNOSIS — I480 Paroxysmal atrial fibrillation: Secondary | ICD-10-CM

## 2017-04-14 LAB — CUP PACEART INCLINIC DEVICE CHECK
Battery Voltage: 2.99 V
Brady Statistic RA Percent Paced: 38 %
Brady Statistic RV Percent Paced: 0.77 %
Implantable Lead Implant Date: 20141010
Implantable Lead Location: 753859
Lead Channel Impedance Value: 450 Ohm
Lead Channel Pacing Threshold Amplitude: 0.75 V
Lead Channel Pacing Threshold Pulse Width: 0.4 ms
Lead Channel Sensing Intrinsic Amplitude: 3.8 mV
Lead Channel Setting Pacing Amplitude: 2 V
Lead Channel Setting Pacing Amplitude: 2.5 V
Lead Channel Setting Pacing Pulse Width: 0.4 ms
MDC IDC LEAD IMPLANT DT: 20141010
MDC IDC LEAD LOCATION: 753860
MDC IDC MSMT LEADCHNL RA PACING THRESHOLD PULSEWIDTH: 0.6 ms
MDC IDC MSMT LEADCHNL RV IMPEDANCE VALUE: 350 Ohm
MDC IDC MSMT LEADCHNL RV PACING THRESHOLD AMPLITUDE: 1.25 V
MDC IDC MSMT LEADCHNL RV SENSING INTR AMPL: 2.2 mV
MDC IDC PG IMPLANT DT: 20141010
MDC IDC PG SERIAL: 2980518
MDC IDC SESS DTM: 20180529174200
MDC IDC SET LEADCHNL RV SENSING SENSITIVITY: 1 mV

## 2017-04-14 NOTE — Telephone Encounter (Signed)
LMOM advising that patient left his umbrella in the Device Clinic.  Umbrella placed at the front desk for pickup.

## 2017-04-14 NOTE — Progress Notes (Signed)
Pacemaker check for reprogramming related to PMT episodes noted on Merlin transmission. Normal device function. Thresholds, sensing, impedances consistent with previous measurements, R-waves stable at 2.742mV. Device programmed to maximize longevity. 2 mode switches (<1%)--1:1 SVT and AT, longest 6 sec. No high ventricular rates noted. 422 PMT episodes, appear real, V-A measures 227ms today, PVARP extended to 300ms. Device programmed at appropriate safety margins. Histogram distribution appropriate for patient activity level. Device programmed to optimize intrinsic conduction. Estimated longevity 8.5-11.2 years. Patient enrolled in remote follow-up. Patient education completed. Merlin on 06/02/17 as scheduled, ROV with SK/B in 08/2017.

## 2017-04-21 ENCOUNTER — Other Ambulatory Visit: Payer: Self-pay | Admitting: *Deleted

## 2017-04-21 ENCOUNTER — Telehealth: Payer: Self-pay | Admitting: Cardiovascular Disease

## 2017-04-21 MED ORDER — DILTIAZEM HCL 30 MG PO TABS: 30.0000 mg | ORAL_TABLET | Freq: Three times a day (TID) | ORAL | 1 refills | Status: DC

## 2017-04-21 NOTE — Telephone Encounter (Signed)
Requested Prescriptions   Signed Prescriptions Disp Refills  . diltiazem (CARDIZEM) 30 MG tablet 270 tablet 1    Sig: Take 1 tablet (30 mg total) by mouth 3 (three) times daily.    Authorizing Provider: GOLLAN, TIMOTHY J    Ordering User: LOPEZ, MARINA C    

## 2017-04-21 NOTE — Telephone Encounter (Signed)
°*  STAT* If patient is at the pharmacy, call can be transferred to refill team.   1. Which medications need to be refilled? (please list name of each medication and dose if known)  Diltiazem   2. Which pharmacy/location (including street and city if local pharmacy) is medication to be sent to? [facility]   3. Do they need a 30 day or 90 day supply? 90 day

## 2017-04-21 NOTE — Telephone Encounter (Signed)
Requested Prescriptions   Signed Prescriptions Disp Refills  . diltiazem (CARDIZEM) 30 MG tablet 270 tablet 1    Sig: Take 1 tablet (30 mg total) by mouth 3 (three) times daily.    Authorizing Provider: Antonieta IbaGOLLAN, TIMOTHY J    Ordering User: Kendrick FriesLOPEZ, MARINA C

## 2017-04-22 ENCOUNTER — Other Ambulatory Visit: Payer: Self-pay | Admitting: Nurse Practitioner

## 2017-04-22 DIAGNOSIS — K7689 Other specified diseases of liver: Secondary | ICD-10-CM

## 2017-04-27 ENCOUNTER — Telehealth: Payer: Self-pay | Admitting: Cardiovascular Disease

## 2017-04-27 NOTE — Telephone Encounter (Signed)
Pt calling stating we need the mg changed in the medication  He is needing the 120 mg called    *STAT* If patient is at the pharmacy, call can be transferred to refill team.   1. Which medications need to be refilled? (please list name of each medication and dose if known)  Diltizem 120 mg   2. Which pharmacy/location (including street and city if local pharmacy) is medication to be sent to? [facility]  3. Do they need a 30 day or 90 day supply? 90 day

## 2017-04-27 NOTE — Telephone Encounter (Signed)
Looks like his diltiazem 120 mg was taken off of his med list at last ov 11/25/16. This may have been an error when rooming, as there is no documentation of d/c'ing this - is it ok to refill/resume?

## 2017-04-27 NOTE — Telephone Encounter (Signed)
Please review for refill. Thanks!  

## 2017-04-28 ENCOUNTER — Ambulatory Visit
Admission: RE | Admit: 2017-04-28 | Discharge: 2017-04-28 | Disposition: A | Discharge status: Home / Self Care | Payer: Medicare Other | Source: Ambulatory Visit | Attending: Nurse Practitioner | Admitting: Nurse Practitioner

## 2017-04-28 DIAGNOSIS — N281 Cyst of kidney, acquired: Secondary | ICD-10-CM | POA: Insufficient documentation

## 2017-04-28 DIAGNOSIS — K7689 Other specified diseases of liver: Secondary | ICD-10-CM | POA: Diagnosis present

## 2017-04-28 DIAGNOSIS — R748 Abnormal levels of other serum enzymes: Secondary | ICD-10-CM | POA: Insufficient documentation

## 2017-04-28 NOTE — Telephone Encounter (Signed)
He can have the diltiazem 120 mg extended release dose We previously may have decreased down to diltiazem 30 mg 3 times a day for low blood pressure Would ask him what he prefers

## 2017-04-29 MED ORDER — DILTIAZEM HCL ER COATED BEADS 120 MG PO CP24: 120.0000 mg | ORAL_CAPSULE | Freq: Every day | ORAL | 3 refills | Status: DC

## 2017-04-29 NOTE — Telephone Encounter (Signed)
Spoke with patient and he states that he had been on diltiazem 120 mg and only takes the diltiazem 30 mg dosage if needed. Let him know that I entered prescription and sent to mail service program for him. He was so appreciative for the call. He states that he just spoke with someone else here in our office but I do not see a note. Let him know that I sent in the prescription and if he has any further questions to please give us a call back.

## 2017-05-25 ENCOUNTER — Encounter: Payer: Self-pay | Admitting: Cardiovascular Disease

## 2017-05-25 ENCOUNTER — Ambulatory Visit (INDEPENDENT_AMBULATORY_CARE_PROVIDER_SITE_OTHER): Payer: Medicare Other | Admitting: Cardiovascular Disease

## 2017-05-25 VITALS — BP 121/69 | HR 66 | Ht 68.0 in | Wt 165.5 lb

## 2017-05-25 DIAGNOSIS — J8410 Pulmonary fibrosis, unspecified: Secondary | ICD-10-CM

## 2017-05-25 DIAGNOSIS — I10 Essential (primary) hypertension: Secondary | ICD-10-CM | POA: Diagnosis not present

## 2017-05-25 DIAGNOSIS — I480 Paroxysmal atrial fibrillation: Secondary | ICD-10-CM | POA: Diagnosis not present

## 2017-05-25 DIAGNOSIS — R079 Chest pain, unspecified: Secondary | ICD-10-CM

## 2017-05-25 DIAGNOSIS — I471 Supraventricular tachycardia: Secondary | ICD-10-CM

## 2017-05-25 DIAGNOSIS — I951 Orthostatic hypotension: Secondary | ICD-10-CM

## 2017-05-25 DIAGNOSIS — E782 Mixed hyperlipidemia: Secondary | ICD-10-CM

## 2017-05-25 DIAGNOSIS — R351 Nocturia: Secondary | ICD-10-CM | POA: Diagnosis not present

## 2017-05-25 NOTE — Progress Notes (Signed)
Cardiology Office Note  Date:  05/25/2017   ID:  Walter Townsend, DOB 1925-06-06, MRN [id number]  PCP:  Rafael Bihari, MD   Chief Complaint  Patient presents with  . OTHER    6 month f/u c/o sob with short distance. Meds reviewed verbally with pt.    HPI:  83 yo gentleman with a h/o of  Anxiety tachypalpitations,  status post pacemaker for AV block followed by Dr. Graciela Husbands,  hyperlipidemia,  GERD, esophageal stricture,  pulmonary fibrosis with worsening SOB over the past several years,   Profound nocturia leading to orthostasis, previously on desmopressin presenting for followup for his labile hypertension/orthostasis .  He is followed by pulmonary for his pulmonary fibrosis He reports being hypothermic at times, confusion as to his temperatures Reports having hypoxia, saturations in the mid-80s  We ambulated him twice around the office and saturations stayed greater than 95% the entire time Seem to move relatively quickly, mildly winded at the end with quick recovery Reported he was short of breath with exertion Reports he is unable to make it from one room to another in his house  Reports that he Can't do much outdoor Drinking gatorade twice a day for his low blood pressures in the morning  Orthostatics in the office today show no significant change with standing, 129 supine down to 122 standing with recovery up to 130 after 3 minutes, no significant change in heart rate  Spitting up yellow sputum, chronic issue  Tolerating diltiazem 120 mg daily Was previously on 30 mg as needed, he went back on the higher dose  He does have prostate issues, improved symptoms with finasteride  Has not had any leg swelling, abdominal bloating Pulmonary fibrosis diagnosis dates back to 2012  Still not sleeping well after loss of his sister   Currently tolerating xarelto.  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 He became exhausted, shortness of breath, could barely make it back into his house. He checked his blood pressure which was 95/55, heart rate 90. His knee is giving him problems, pain. Sees orthopedics for shot [name]  He only smoked for 4 years as a teenager but did have significant secondhand smoke. Previously seen by pulmonary for lung dz  PMH:   has a past medical history of Cardiac pacemaker st Judes; Hyperlipidemia; Hypertension; Orthostatic hypotension (08/22/2014); PAF (paroxysmal atrial fibrillation) (HCC); Prostate enlargement; and Pulmonary fibrosis (HCC).  PSH:    Past Surgical History:  Procedure Laterality Date  . BACK SURGERY    . FOOT SURGERY    . HERNIA REPAIR    . INSERT / REPLACE / REMOVE PACEMAKER    . PACEMAKER INSERTION  08/26/2013   St. Jude Assurity DDD pacemaker  . PERMANENT PACEMAKER INSERTION N/A 08/26/2013   Procedure: PERMANENT PACEMAKER INSERTION;  Surgeon: Marinus Maw, MD;  Location[facility] CATH LAB;  Service: Cardiovascular;  Laterality: N/A;  . TEMPORARY PACEMAKER INSERTION N/A 08/25/2013   Procedure: TEMPORARY PACEMAKER INSERTION;  Surgeon: Lyn Records III,  MD;  Location: [facility] CATH LAB;  Service: Cardiovascular;  Laterality: N/A;    Current Outpatient Prescriptions  Medication Sig Dispense Refill  . atorvastatin (LIPITOR) 20 MG tablet Take 1 tablet (20 mg total) by mouth daily. 90 tablet 3  . Calcium Carbonate-Vitamin D (CALCIUM-VITAMIN D) 600-200 MG-UNIT CAPS Take 1 capsule by mouth daily.      . cyanocobalamin 100  MCG tablet Take 100 mcg by mouth daily.    Marland Kitchen. desmopressin (DDAVP) 0.1 MG tablet Take 1 tablet (0.1 mg total) by mouth daily. 90 tablet 1  . diazepam (VALIUM) 5 MG tablet Take 1 tablet (5 mg total) by mouth every 8 (eight) hours as needed (To be taken as needed with meclizine for vertigo). 20 tablet 0  . diltiazem (CARDIZEM CD) 120 MG 24 hr capsule Take 1 capsule (120 mg total) by mouth daily. 90 capsule 3  . docusate sodium (COLACE) 100 MG capsule Take 100 mg by mouth at bedtime as needed for constipation.    . finasteride (PROSCAR) 5 MG tablet Take 1 tablet (5 mg total) by mouth daily.    . Garlic Oil 1000 MG CAPS Take 1 capsule by mouth daily.      Marland Kitchen. glucosamine-chondroitin 500-400 MG tablet Take 1 tablet by mouth daily.      . lansoprazole (PREVACID) 30 MG capsule Take 1 capsule (30 mg total) by mouth daily. 90 capsule 3  . lisinopril (ZESTRIL) 2.5 MG tablet Take 1 tablet (2.5 mg total) by mouth daily. (Patient taking differently: Take 2.5 mg by mouth daily as needed. ) 90 tablet 3  . meclizine (ANTIVERT) 25 MG tablet Take 1 tablet (25 mg total) by mouth 3 (three) times daily as needed for dizziness or nausea. 30 tablet 1  . Multiple Vitamin (MULTIVITAMIN) tablet Take 1 tablet by mouth daily.      . rivaroxaban (XARELTO) 20 MG TABS tablet Take 1 tablet (20 mg total) by mouth daily with supper. 90 tablet 3  . saw palmetto 160 MG capsule Take 160 mg by mouth daily.       No current facility-administered medications for this visit.      Allergies:   Patient has no known allergies.   Social History:  The patient  reports that he quit smoking about 68 years ago. His smoking use included Cigarettes. He has a 0.90 pack-year smoking history. He has never used smokeless tobacco. He reports that he does not drink alcohol or use drugs.   Family History:   family history includes Cancer in his brother; Coronary artery disease in his other; Heart attack in his brother, brother, brother, brother, and  sister; Heart disease in his father and other; Heart disease (age of onset: 5365) in his son; Heart failure in his brother.    Review of Systems: Review of Systems  Respiratory: Positive for shortness of breath.   Cardiovascular: Negative.   Gastrointestinal: Negative.   Musculoskeletal: Negative.   Neurological: Positive for weakness.  Psychiatric/Behavioral: Negative.   All other systems reviewed and are negative.    PHYSICAL EXAM: VS:  BP 121/69 (BP Location: Left Arm, Patient Position: Sitting, Cuff Size: Normal)   Pulse 66   Ht 5\' 8"  (1.727 m)   Wt 165 lb 8 oz (75.1 kg)   SpO2 97%   BMI 25.16 kg/m  , BMI Body mass index is 25.16 kg/m. GEN: Well nourished, well developed, in no acute distress  HEENT: normal  Neck: no JVD, carotid bruits, or masses Cardiac: RRR;  no murmurs, rubs, or gallops,no edema  Respiratory:  Crackles at the bases bilaterally halfway up, normal work of breathing GI: soft, nontender, nondistended, + BS MS: no deformity or atrophy  Skin: warm and dry, no rash Neuro:  Strength and sensation are intact Psych: euthymic mood, full affect    Recent Labs: 02/09/2017: ALT 34; BUN 17; Creatinine, Ser 0.92; Hemoglobin 13.8; Platelets 216; Potassium 4.3; Sodium 137    Lipid Panel No results found for: CHOL, HDL, LDLCALC, TRIG    Wt Readings from Last 3 Encounters:  05/25/17 165 lb 8 oz (75.1 kg)  02/09/17 160 lb (72.6 kg)  01/15/17 169 lb (76.7 kg)       ASSESSMENT AND PLAN: Mixed hyperlipidemia - Plan: EKG 12-Lead Tolerating Lipitor 20 mg daily  Essential hypertension - Plan: EKG 12-Lead Tolerating diltiazem 120 mg daily Suggested he continue high fluid intake in the mornings after excessive urination overnight  SVT/ PSVT/ PAT - Plan: EKG 12-Lead On diltiazem for rhythm control Denies having any recent episodes of tachycardia  Chest pain at rest - Plan: EKG 12-Lead No recent episodes of chest pain at rest, no further workup at this  time  Orthostatic hypotension Likely from significant nocturia Recommended he talk with primary care about restarting desmopressin before bed  Pulmonary fibrosis (HCC) Discussed case with pulmonary Likely not a candidate for oxygen as he is maintaining saturations greater than 95% with ambulation. He has symptomatic shortness of breath but no hypoxia  AV block, complete-intermittent -   Pacemaker, followed by Dr. Lars Masson to be working appropriately  Paroxysmal atrial fibrillation Previously with mode switches noted on pacer download Started on anticoagulation, tolerating xarelto.   Creatinine clearance is greater than 50  Excess urination at night Recommended he discuss this with primary care, Dr. Dan Humphreys Reports symptoms somewhat improved with desmopressin before bed 0.1 mg He is requesting a refill Likely having inappropriate diuresis at night given hypotension in the morning He may benefit from additional dose at 3 in the morning if he continues to have orthostasis or higher dose before bed, possibly 0.2 mg  Orthostasis Currently taking old desmopressin before bed and cut back on his lisinopril, diltiazem with improved orthostasis symptoms  SOB (shortness of breath) -  Likely secondary to underlying fibrosis We'll refer to pulmonary as he was last seen 5 years ago Unclear if there are new treatment options  Chest pain, unspecified chest pain type -  Recent negative stress test Chest pain atypical in nature today  Pulmonary fibrosis (HCC) Followed by pulmonary, slow progressive symptoms, worsening shortness of breath on exertion   Anxiety Long history of stress/anxiety No exercise program. Recent death of his sister has added to his symptoms   Total encounter time more than 45 minutes  Greater than 50% was spent in counseling and coordination of care with the patient  Disposition:   F/U  6 months   Orders Placed This Encounter  Procedures  . EKG 12-Lead      Signed, Dossie Arbour, M.D., Ph.D. 05/25/2017  [facility] [place], [place] [phone]

## 2017-05-25 NOTE — Patient Instructions (Signed)

## 2017-05-26 ENCOUNTER — Ambulatory Visit: Payer: Medicare Other | Admitting: Cardiovascular Disease

## 2017-05-28 ENCOUNTER — Ambulatory Visit: Payer: Medicare Other | Admitting: Cardiovascular Disease

## 2017-06-02 ENCOUNTER — Observation Stay
Admission: EM | Admit: 2017-06-02 | Discharge: 2017-06-03 | Disposition: A | Discharge status: Home / Self Care | Payer: Medicare Other | Attending: Internal Medicine | Admitting: Internal Medicine

## 2017-06-02 ENCOUNTER — Emergency Department: Payer: Medicare Other

## 2017-06-02 ENCOUNTER — Ambulatory Visit (INDEPENDENT_AMBULATORY_CARE_PROVIDER_SITE_OTHER): Payer: Medicare Other | Admitting: *Deleted

## 2017-06-02 DIAGNOSIS — Z8249 Family history of ischemic heart disease and other diseases of the circulatory system: Secondary | ICD-10-CM | POA: Diagnosis not present

## 2017-06-02 DIAGNOSIS — I480 Paroxysmal atrial fibrillation: Secondary | ICD-10-CM | POA: Insufficient documentation

## 2017-06-02 DIAGNOSIS — I951 Orthostatic hypotension: Secondary | ICD-10-CM | POA: Insufficient documentation

## 2017-06-02 DIAGNOSIS — J9621 Acute and chronic respiratory failure with hypoxia: Secondary | ICD-10-CM | POA: Insufficient documentation

## 2017-06-02 DIAGNOSIS — E871 Hypo-osmolality and hyponatremia: Secondary | ICD-10-CM | POA: Diagnosis present

## 2017-06-02 DIAGNOSIS — J440 Chronic obstructive pulmonary disease with acute lower respiratory infection: Secondary | ICD-10-CM | POA: Diagnosis not present

## 2017-06-02 DIAGNOSIS — N400 Enlarged prostate without lower urinary tract symptoms: Secondary | ICD-10-CM | POA: Diagnosis not present

## 2017-06-02 DIAGNOSIS — Z87891 Personal history of nicotine dependence: Secondary | ICD-10-CM | POA: Diagnosis not present

## 2017-06-02 DIAGNOSIS — J209 Acute bronchitis, unspecified: Secondary | ICD-10-CM | POA: Insufficient documentation

## 2017-06-02 DIAGNOSIS — E785 Hyperlipidemia, unspecified: Secondary | ICD-10-CM | POA: Diagnosis not present

## 2017-06-02 DIAGNOSIS — R778 Other specified abnormalities of plasma proteins: Secondary | ICD-10-CM

## 2017-06-02 DIAGNOSIS — E876 Hypokalemia: Secondary | ICD-10-CM | POA: Diagnosis not present

## 2017-06-02 DIAGNOSIS — K449 Diaphragmatic hernia without obstruction or gangrene: Secondary | ICD-10-CM | POA: Diagnosis not present

## 2017-06-02 DIAGNOSIS — J8410 Pulmonary fibrosis, unspecified: Secondary | ICD-10-CM | POA: Diagnosis not present

## 2017-06-02 DIAGNOSIS — Z79899 Other long term (current) drug therapy: Secondary | ICD-10-CM | POA: Insufficient documentation

## 2017-06-02 DIAGNOSIS — R7989 Other specified abnormal findings of blood chemistry: Secondary | ICD-10-CM

## 2017-06-02 DIAGNOSIS — I700 Atherosclerosis of aorta: Secondary | ICD-10-CM | POA: Insufficient documentation

## 2017-06-02 DIAGNOSIS — Z7901 Long term (current) use of anticoagulants: Secondary | ICD-10-CM | POA: Diagnosis not present

## 2017-06-02 DIAGNOSIS — R0602 Shortness of breath: Secondary | ICD-10-CM

## 2017-06-02 DIAGNOSIS — D72829 Elevated white blood cell count, unspecified: Secondary | ICD-10-CM | POA: Insufficient documentation

## 2017-06-02 DIAGNOSIS — I119 Hypertensive heart disease without heart failure: Secondary | ICD-10-CM | POA: Insufficient documentation

## 2017-06-02 DIAGNOSIS — I442 Atrioventricular block, complete: Secondary | ICD-10-CM | POA: Diagnosis not present

## 2017-06-02 DIAGNOSIS — J40 Bronchitis, not specified as acute or chronic: Secondary | ICD-10-CM

## 2017-06-02 DIAGNOSIS — Z9981 Dependence on supplemental oxygen: Secondary | ICD-10-CM | POA: Insufficient documentation

## 2017-06-02 DIAGNOSIS — Z950 Presence of cardiac pacemaker: Secondary | ICD-10-CM | POA: Diagnosis not present

## 2017-06-02 DIAGNOSIS — K7689 Other specified diseases of liver: Secondary | ICD-10-CM | POA: Insufficient documentation

## 2017-06-02 DIAGNOSIS — Z808 Family history of malignant neoplasm of other organs or systems: Secondary | ICD-10-CM | POA: Insufficient documentation

## 2017-06-02 DIAGNOSIS — R748 Abnormal levels of other serum enzymes: Secondary | ICD-10-CM | POA: Diagnosis present

## 2017-06-02 LAB — CBC
HCT: 34.3 % — ABNORMAL LOW (ref 40.0–52.0)
Hemoglobin: 11.8 g/dL — ABNORMAL LOW (ref 13.0–18.0)
MCH: 30.6 pg (ref 26.0–34.0)
MCHC: 34.2 g/dL (ref 32.0–36.0)
MCV: 89.4 fL (ref 80.0–100.0)
PLATELETS: 272 10*3/uL (ref 150–440)
RBC: 3.84 MIL/uL — ABNORMAL LOW (ref 4.40–5.90)
RDW: 13 % (ref 11.5–14.5)
WBC: 16.7 10*3/uL — ABNORMAL HIGH (ref 3.8–10.6)

## 2017-06-02 LAB — BASIC METABOLIC PANEL
Anion gap: 8 (ref 5–15)
BUN: 21 mg/dL — AB (ref 6–20)
CALCIUM: 8.6 mg/dL — AB (ref 8.9–10.3)
CO2: 22 mmol/L (ref 22–32)
CREATININE: 1.1 mg/dL (ref 0.61–1.24)
Chloride: 96 mmol/L — ABNORMAL LOW (ref 101–111)
GFR calc Af Amer: >60 mL/min (ref >60)
GFR calc non Af Amer: 57 mL/min — ABNORMAL LOW (ref >60)
GLUCOSE: 111 mg/dL — AB (ref 65–99)
Potassium: 4 mmol/L (ref 3.5–5.1)
Sodium: 126 mmol/L — ABNORMAL LOW (ref 135–145)

## 2017-06-02 LAB — BRAIN NATRIURETIC PEPTIDE: B NATRIURETIC PEPTIDE 5: 754 pg/mL — AB (ref 0.0–100.0)

## 2017-06-02 LAB — TROPONIN I: TROPONIN I: 0.1 ng/mL — AB (ref <0.03)

## 2017-06-02 MED ORDER — CALCIUM CARBONATE-VITAMIN D 500-200 MG-UNIT PO TABS
1.0000 | ORAL_TABLET | Freq: Every day | ORAL | Status: DC
  Administered 2017-06-03: 08:00:00 1 via ORAL
  Filled 2017-06-02: qty 1

## 2017-06-02 MED ORDER — DILTIAZEM HCL ER COATED BEADS 120 MG PO CP24
120.0000 mg | ORAL_CAPSULE | Freq: Every day | ORAL | Status: DC
  Administered 2017-06-03: 120 mg via ORAL
  Filled 2017-06-02: qty 1

## 2017-06-02 MED ORDER — FINASTERIDE 5 MG PO TABS
5.0000 mg | ORAL_TABLET | Freq: Every day | ORAL | Status: DC
  Administered 2017-06-03: 08:00:00 5 mg via ORAL
  Filled 2017-06-02: qty 1

## 2017-06-02 MED ORDER — DOCUSATE SODIUM 100 MG PO CAPS: 100.0000 mg | ORAL_CAPSULE | Freq: Two times a day (BID) | ORAL | Status: DC | PRN

## 2017-06-02 MED ORDER — SODIUM CHLORIDE 0.9 % IV SOLN
Freq: Once | INTRAVENOUS | Status: AC
  Administered 2017-06-02: 18:00:00 via INTRAVENOUS

## 2017-06-02 MED ORDER — RIVAROXABAN 20 MG PO TABS
20.0000 mg | ORAL_TABLET | Freq: Every day | ORAL | Status: DC
  Filled 2017-06-02: qty 1

## 2017-06-02 MED ORDER — MECLIZINE HCL 25 MG PO TABS
25.0000 mg | ORAL_TABLET | Freq: Three times a day (TID) | ORAL | Status: DC | PRN
  Filled 2017-06-02: qty 1

## 2017-06-02 MED ORDER — LEVOFLOXACIN IN D5W 750 MG/150ML IV SOLN
750.0000 mg | Freq: Once | INTRAVENOUS | Status: AC
  Administered 2017-06-02: 750 mg via INTRAVENOUS
  Filled 2017-06-02: qty 150

## 2017-06-02 MED ORDER — DEXTROSE 5 % IV SOLN
1.0000 g | INTRAVENOUS | Status: DC
  Administered 2017-06-02: 1 g via INTRAVENOUS
  Filled 2017-06-02: qty 10

## 2017-06-02 MED ORDER — IOPAMIDOL (ISOVUE-370) INJECTION 76%
75.0000 mL | Freq: Once | INTRAVENOUS | Status: AC | PRN
  Administered 2017-06-02: 75 mL via INTRAVENOUS

## 2017-06-02 MED ORDER — DOCUSATE SODIUM 100 MG PO CAPS
200.0000 mg | ORAL_CAPSULE | Freq: Every day | ORAL | Status: DC
  Administered 2017-06-02: 200 mg via ORAL
  Filled 2017-06-02: qty 2

## 2017-06-02 MED ORDER — METHYLPREDNISOLONE SODIUM SUCC 125 MG IJ SOLR
60.0000 mg | Freq: Four times a day (QID) | INTRAMUSCULAR | Status: DC
  Administered 2017-06-02 – 2017-06-03 (×3): 60 mg via INTRAVENOUS
  Filled 2017-06-02 (×4): qty 2

## 2017-06-02 MED ORDER — ATORVASTATIN CALCIUM 20 MG PO TABS: 20.0000 mg | ORAL_TABLET | Freq: Every day | ORAL | Status: DC

## 2017-06-02 MED ORDER — CALCIUM-VITAMIN D 600-200 MG-UNIT PO CAPS
1.0000 | ORAL_CAPSULE | Freq: Every day | ORAL | Status: DC
Start: 2017-06-02 — End: 2017-06-02

## 2017-06-02 MED ORDER — MIRTAZAPINE 15 MG PO TABS
7.5000 mg | ORAL_TABLET | Freq: Every day | ORAL | Status: DC
  Administered 2017-06-02: 23:00:00 7.5 mg via ORAL
  Filled 2017-06-02: qty 1

## 2017-06-02 MED ORDER — AZITHROMYCIN 500 MG IV SOLR
250.0000 mg | INTRAVENOUS | Status: DC
  Filled 2017-06-02: qty 250

## 2017-06-02 MED ORDER — DEXTROSE 5 % IV SOLN
INTRAVENOUS | Status: AC
  Administered 2017-06-02: 1 g via INTRAVENOUS
  Filled 2017-06-02: qty 10

## 2017-06-02 MED ORDER — IPRATROPIUM-ALBUTEROL 0.5-2.5 (3) MG/3ML IN SOLN
3.0000 mL | RESPIRATORY_TRACT | Status: DC
  Administered 2017-06-02 – 2017-06-03 (×6): 3 mL via RESPIRATORY_TRACT
  Filled 2017-06-02 (×6): qty 3

## 2017-06-02 MED ORDER — PANTOPRAZOLE SODIUM 40 MG PO TBEC
40.0000 mg | DELAYED_RELEASE_TABLET | Freq: Every day | ORAL | Status: DC
  Administered 2017-06-03: 40 mg via ORAL
  Filled 2017-06-02: qty 1

## 2017-06-02 NOTE — ED Notes (Signed)
Patient transported to CT 

## 2017-06-02 NOTE — Progress Notes (Signed)
Remote pacemaker transmission.   

## 2017-06-02 NOTE — ED Provider Notes (Signed)
[facility] Emergency Department Provider Note       Time seen: ----------------------------------------- 4:17 PM on 06/02/2017 -----------------------------------------     I have reviewed the triage vital signs and the nursing notes.   HISTORY   Chief Complaint Shortness of Breath    HPI Walter Townsend is a 83 y.o. male who presents to the ED for increasing shortness of breath over the last 2 weeks which is worse with exertion. Patient reports recently being seen by his doctor and advise she has pulmonary fibrosis that is progressing throughout his lungs. Patient feels like he may need home oxygen at some point. He denies fevers, chills, productive cough, vomiting or diarrhea.   Past Medical History:  Diagnosis Date  . Cardiac pacemaker st Jud[employer]   . Hyperlipidemia   . Hypertension   . Orthostatic hypotension 08/22/2014  . PAF (paroxysmal atrial fibrillation) (HCC)   . Prostate enlargement   . Pulmonary fibrosis [facility])     Patient Active Problem List   Diagnosis Date Noted  . Paroxysmal atrial fibrillation (HCC) 09/22/2016  . Chest pain at rest 02/06/2016  . Excessive urination at night 11/21/2014  . Orthostatic hypotension 08/22/2014  . Heat stroke 07/07/2014  . Pacemaker -St. Jude 11/29/2013  . 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 Reflux 10/27/2011  . Cough 10/27/2011  . Pulmonary fibrosis (HCC) 10/06/2011  . SOB (shortness of breath) 09/25/2011  . SVT/ PSVT/ PAT 06/20/2010  . Hyperlipidemia 02/06/2010  . Essential hypertension 02/06/2010    Past Surgical History:  Procedure Laterality Date  . BACK SURGERY    . FOOT SURGERY    . HERNIA REPAIR    . INSERT / REPLACE / REMOVE PACEMAKER    . PACEMAKER INSERTION  08/26/2013   St. Jude Assurity DDD pacemaker  . PERMANENT PACEMAKER INSERTION N/A 08/26/2013   Procedure: PERMANENT PACEMAKER INSERTION;  Surgeon: Marinus Maw, MD;  Location: [facility] CATH LAB;   Service: Cardiovascular;  Laterality: N/A;  . TEMPORARY PACEMAKER INSERTION N/A 08/25/2013   Procedure: TEMPORARY PACEMAKER INSERTION;  Surgeon: Lesleigh Noe, MD;  Location: [facility] CATH LAB;  Service: Cardiovascular;  Laterality: N/A;    Allergies Patient has no known allergies.  Social History Social History  Substance Use Topics  . Smoking status: Former Smoker    Packs/day: 0.30    Years: 3.00    Types: Cigarettes    Quit date: 11/17/1948  . Smokeless tobacco: Never Used  . Alcohol use No    Review of Systems Constitutional: Negative for fever. Eyes: Negative for vision changes ENT:  Negative for congestion, sore throat Cardiovascular: Negative for chest pain. Respiratory: Positive shortness of breath Gastrointestinal: Negative for abdominal pain, vomiting and diarrhea. Genitourinary: Negative for dysuria. Musculoskeletal: Negative for back pain. Skin: Negative for rash. Neurological: Negative for headaches, focal weakness or numbness.  All systems negative/normal/unremarkable except as stated in the HPI  ____________________________________________   PHYSICAL EXAM:  VITAL SIGNS: ED Triage Vitals [06/02/17 1516]  Enc Vitals Group     BP 125/85     Pulse Rate 76     Resp 20     Temp 98 F (36.7 C)     Temp Source Oral     SpO2 96 %     Weight 165 lb (74.8 kg)     Height 5\' 8"  (1.727 m)     Head Circumference      Peak Flow      Pain Score  Pain Loc      Pain Edu?      Excl. in GC?     Constitutional: Alert and oriented. Mild distress Eyes: Conjunctivae are normal. Normal extraocular movements. ENT   Head: Normocephalic and atraumatic.   Nose: No congestion/rhinnorhea.   Mouth/Throat: Mucous membranes are moist.   Neck: No stridor. Cardiovascular: Normal rate, regular rhythm. No murmurs, rubs, or gallops. Respiratory: Mild tachypnea with crackles in the lower halves of both lung fields Gastrointestinal: Soft and nontender. Normal  bowel sounds Musculoskeletal: Nontender with normal range of motion in extremities. No lower extremity tenderness nor edema. Neurologic:  Normal speech and language. No gross focal neurologic deficits are appreciated.  Skin:  Skin is warm, dry and intact. No rash noted. Psychiatric: Mood and affect are normal. Speech and behavior are normal.  ____________________________________________  EKG: Interpreted by me. Ventricular paced rhythm with a rate of 111 bpm, pacemaker function  ____________________________________________  ED COURSE:  Pertinent labs & imaging results that were available during my care of the patient were reviewed by me and considered in my medical decision making (see chart for details). Patient presents for dyspnea, we will assess with labs and imaging as indicated.   Procedures ____________________________________________   LABS (pertinent positives/negatives)  Labs Reviewed  BASIC METABOLIC PANEL - Abnormal; Notable for the following:       Result Value   Sodium 126 (*)    Chloride 96 (*)    Glucose, Bld 111 (*)    BUN 21 (*)    Calcium 8.6 (*)    GFR calc non Af Amer 57 (*)    All other components within normal limits  CBC - Abnormal; Notable for the following:    WBC 16.7 (*)    RBC 3.84 (*)    Hemoglobin 11.8 (*)    HCT 34.3 (*)    All other components within normal limits  TROPONIN I - Abnormal; Notable for the following:    Troponin I 0.10 (*)    All other components within normal limits  BRAIN NATRIURETIC PEPTIDE   CRITICAL CARE Performed by: Emily Filbert   Total critical care time: 30 minutes  Critical care time was exclusive of separately billable procedures and treating other patients.  Critical care was necessary to treat or prevent imminent or life-threatening deterioration.  Critical care was time spent personally by me on the following activities: development of treatment plan with patient and/or surrogate as well as  nursing, discussions with consultants, evaluation of patient's response to treatment, examination of patient, obtaining history from patient or surrogate, ordering and performing treatments and interventions, ordering and review of laboratory studies, ordering and review of radiographic studies, pulse oximetry and re-evaluation of patient's condition.  RADIOLOGY Images were viewed by me  Chest x-ray  IMPRESSION: Pulmonary fibrosis. When compared to prior, no evidence of acute superimposed disease. ____________________________________________  FINAL ASSESSMENT AND PLAN  Dyspnea, elevated troponin, hyponatremia, Pulmonary fibrosis  Plan: Patient's labs and imaging were dictated above. Patient had presented for increasing shortness of breath, particularly with exertion. Patient does have progressive pulmonary fibrosis without any evidence of superimposed infection today. However his white blood cell count was elevated so I will prescribe IV Levaquin. He is also hyponatremic and will receive gentle saline infusion. Unclear etiology for the elevated troponin which will need to be rechecked.    Emily Filbert, MD   Note: This note was generated in part or whole with voice recognition software. Voice recognition is usually  quite accurate but there are transcription errors that can and very often do occur. I apologize for any typographical errors that were not detected and corrected.     Emily FilbertWilliams, Annaliza Zia E, MD 06/02/17 [phone]

## 2017-06-02 NOTE — ED Notes (Signed)
Patient up to restroom to urinate with one assist. Walked to toilet and around room with minimal difficulty. Respirations increased to 26-28 breaths per minute, but sats were 100@ upon returning to bed prior to replacing O2 via [place]. [place] applied for comfort per patient request.

## 2017-06-02 NOTE — H&P (Signed)
[employer] [facility] [facility] [place] [facility]   PATIENT NAME: Walter Townsend    MR#:  [id number]  DATE OF BIRTH:  [birth year]  DATE OF ADMISSION:  06/02/2017  PRIMARY CARE PHYSICIAN: Rafael Bihari, MD   REQUESTING/REFERRING PHYSICIAN: williams  CHIEF COMPLAINT:   Chief Complaint  Patient presents with  . Shortness of Breath    HISTORY OF PRESENT ILLNESS: Walter Townsend  is a 83 y.o. male with a known history of Cardiac pacemaker, hyperlipidemia, hypertension, paroxysmal atrial fibrillation, pulmonary fibrosis- for last 2 weeks feeling increasingly short of breath. He went to a cardiologist office last week and he told everything looks okay from the cardiac point of view. He also went to see PMD in the office today and he had complain of some yellowish sputum production with some cough and shortness of breath so PMD suggested it may be bronchitis and gave him oral Keflex and send him home with advice to return back in 5 days ago to emergency room if he gets worse. But patient decided as he was not feeling well just to come to emergency room on his own anyways, when he was seen in ER was noted to be short of breath and having increased respiratory rate and requiring supplemental oxygen so ER physician did CT scan of the chest which did not show any significant findings like pulmonary emboli or infiltrate but because of patient's symptomatic presentation he decided to give to hospitalist team for further management.  PAST MEDICAL HISTORY:   Past Medical History:  Diagnosis Date  . Cardiac pacemaker st Judes   . Hyperlipidemia   . Hypertension   . Orthostatic hypotension 08/22/2014  . PAF (paroxysmal atrial fibrillation) (HCC)   . Prostate enlargement   . Pulmonary fibrosis (HCC)     PAST SURGICAL HISTORY: Past Surgical History:  Procedure Laterality Date  . BACK SURGERY    . FOOT SURGERY    . HERNIA REPAIR    . INSERT / REPLACE / REMOVE PACEMAKER    . PACEMAKER  INSERTION  08/26/2013   St. Jude Assurity DDD pacemaker  . PERMANENT PACEMAKER INSERTION N/A 08/26/2013   Procedure: PERMANENT PACEMAKER INSERTION;  Surgeon: Marinus Maw, MD;  Location: [facility] CATH LAB;  Service: Cardiovascular;  Laterality: N/A;  . TEMPORARY PACEMAKER INSERTION N/A 08/25/2013   Procedure: TEMPORARY PACEMAKER INSERTION;  Surgeon: Lesleigh Noe, MD;  Location: [facility] CATH LAB;  Service: Cardiovascular;  Laterality: N/A;    SOCIAL HISTORY:  Social History  Substance Use Topics  . Smoking status: Former Smoker    Packs/day: 0.30    Years: 3.00    Types: Cigarettes    Quit date: 11/17/1948  . Smokeless tobacco: Never Used  . Alcohol use No    FAMILY HISTORY:  Family History  Problem Relation Age of Onset  . Heart disease Father   . Heart attack Brother   . Heart attack Sister   . Heart attack Brother   . Heart failure Brother   . Heart attack Brother   . Heart attack Brother   . Cancer Brother        skin  . Heart disease Other   . Coronary artery disease Other   . Heart disease Son 65       stent placement     DRUG ALLERGIES: No Known Allergies  REVIEW OF SYSTEMS:   CONSTITUTIONAL: No fever, fatigue or weakness.  EYES: No blurred or double vision.  EARS, NOSE, AND  THROAT: No tinnitus or ear pain.  RESPIRATORY: Positive for cough, shortness of breath, wheezing , no hemoptysis.  CARDIOVASCULAR: No chest pain, orthopnea, edema.  GASTROINTESTINAL: No nausea, vomiting, diarrhea or abdominal pain.  GENITOURINARY: No dysuria, hematuria.  ENDOCRINE: No polyuria, nocturia,  HEMATOLOGY: No anemia, easy bruising or bleeding SKIN: No rash or lesion. MUSCULOSKELETAL: No joint pain or arthritis.   NEUROLOGIC: No tingling, numbness, weakness.  PSYCHIATRY: No anxiety or depression.   MEDICATIONS AT HOME:  Prior to Admission medications   Medication Sig Start Date End Date Taking? Authorizing Provider  atorvastatin (LIPITOR) 20 MG tablet Take 1 tablet (20 mg total)  by mouth daily. 10/01/16  Yes Gollan, Tollie Pizzaimothy J, MD  Calcium Carbonate-Vitamin D (CALCIUM-VITAMIN D) 600-200 MG-UNIT CAPS Take 1 capsule by mouth daily.     Yes [provider]  cyanocobalamin 100 MCG tablet Take 100 mcg by mouth daily.   Yes [provider]  diltiazem (CARDIZEM CD) 120 MG 24 hr capsule Take 1 capsule (120 mg total) by mouth daily. 04/29/17 07/28/17 Yes Gollan, Tollie Pizzaimothy J, MD  docusate sodium (COLACE) 100 MG capsule Take 200 mg by mouth at bedtime.    Yes [provider]  finasteride (PROSCAR) 5 MG tablet Take 1 tablet (5 mg total) by mouth daily. 09/22/16  Yes Antonieta IbaGollan, Timothy J, MD  lansoprazole (PREVACID) 30 MG capsule Take 1 capsule (30 mg total) by mouth daily. 10/07/16  Yes Gollan, Tollie Pizzaimothy J, MD  lisinopril (ZESTRIL) 2.5 MG tablet Take 1 tablet (2.5 mg total) by mouth daily. Patient taking differently: Take 2.5 mg by mouth daily as needed.  10/01/16  Yes Antonieta IbaGollan, Timothy J, MD  meclizine (ANTIVERT) 25 MG tablet Take 1 tablet (25 mg total) by mouth 3 (three) times daily as needed for dizziness or nausea. 02/09/17  Yes Emily FilbertWilliams, Jonathan E, MD  mirtazapine (REMERON) 7.5 MG tablet Take 7.5 mg by mouth at bedtime. 06/02/17  Yes [provider]  rivaroxaban (XARELTO) 20 MG TABS tablet Take 1 tablet (20 mg total) by mouth daily with supper. 10/01/16  Yes Gollan, Tollie Pizzaimothy J, MD  desmopressin (DDAVP) 0.1 MG tablet Take 1 tablet (0.1 mg total) by mouth daily. Patient not taking: Reported on 06/02/2017 11/25/16   Antonieta IbaGollan, Timothy J, MD  diazepam (VALIUM) 5 MG tablet Take 1 tablet (5 mg total) by mouth every 8 (eight) hours as needed (To be taken as needed with meclizine for vertigo). Patient not taking: Reported on 06/02/2017 02/09/17   Emily FilbertWilliams, Jonathan E, MD      PHYSICAL EXAMINATION:   VITAL SIGNS: Blood pressure (!) 144/68, pulse (!) 109, temperature 98 F (36.7 C), temperature source Oral, resp. rate (!) 21, height 5\' 8"  (1.727 m), weight 74.8 kg (165  lb), SpO2 99 %.  GENERAL:  83 y.o.-year-old patient lying in the bed with no acute distress.  EYES: Pupils equal, round, reactive to light and accommodation. No scleral icterus. Extraocular muscles intact.  HEENT: Head atraumatic, normocephalic. Oropharynx and nasopharynx clear.  NECK:  Supple, no jugular venous distention. No thyroid enlargement, no tenderness.  LUNGS: Normal breath sounds bilaterally, no wheezing, bilateral crepitation. Positive for use of accessory muscles of respiration.  CARDIOVASCULAR: S1, S2 normal. No murmurs, rubs, or gallops.  ABDOMEN: Soft, nontender, nondistended. Bowel sounds present. No organomegaly or mass.  EXTREMITIES: No pedal edema, cyanosis, or clubbing.  NEUROLOGIC: Cranial nerves II through XII are intact. Muscle strength 5/5 in all extremities. Sensation intact. Gait not checked.  PSYCHIATRIC: The patient is alert and  oriented x 3.  SKIN: No obvious rash, lesion, or ulcer.   LABORATORY PANEL:   CBC  Recent Labs Lab 06/02/17 1516  WBC 16.7*  HGB 11.8*  HCT 34.3*  PLT 272  MCV 89.4  MCH 30.6  MCHC 34.2  RDW 13.0   ------------------------------------------------------------------------------------------------------------------  Chemistries   Recent Labs Lab 06/02/17 1516  NA 126*  K 4.0  CL 96*  CO2 22  GLUCOSE 111*  BUN 21*  CREATININE 1.10  CALCIUM 8.6*   ------------------------------------------------------------------------------------------------------------------ estimated creatinine clearance is 44 mL/min (by C-G formula based on SCr of 1.1 mg/dL). ------------------------------------------------------------------------------------------------------------------ No results for input(s): TSH, T4TOTAL, T3FREE, THYROIDAB in the last 72 hours.  Invalid input(s): FREET3   Coagulation profile No results for input(s): INR, PROTIME in the last 168  hours. ------------------------------------------------------------------------------------------------------------------- No results for input(s): DDIMER in the last 72 hours. -------------------------------------------------------------------------------------------------------------------  Cardiac Enzymes  Recent Labs Lab 06/02/17 1516  TROPONINI 0.10*   ------------------------------------------------------------------------------------------------------------------ Invalid input(s): POCBNP  ---------------------------------------------------------------------------------------------------------------  Urinalysis    Component Value Date/Time   COLORURINE YELLOW (A) 02/09/2017 1006   APPEARANCEUR HAZY (A) 02/09/2017 1006   LABSPEC 1.013 02/09/2017 1006   PHURINE 8.0 02/09/2017 1006   GLUCOSEU NEGATIVE 02/09/2017 1006   HGBUR NEGATIVE 02/09/2017 1006   BILIRUBINUR NEGATIVE 02/09/2017 1006   KETONESUR NEGATIVE 02/09/2017 1006   PROTEINUR NEGATIVE 02/09/2017 1006   NITRITE NEGATIVE 02/09/2017 1006   LEUKOCYTESUR NEGATIVE 02/09/2017 1006     RADIOLOGY: Dg Chest 2 View  Result Date: 06/02/2017 CLINICAL DATA:  Shortness of breath EXAM: CHEST  2 VIEW COMPARISON:  01/15/2017 FINDINGS: Basilar predominant coarse interstitial opacities and mildly low lung volumes. No superimposed acute opacity. No effusion or pneumothorax. Stable mild cardiomegaly. Stable aortic contours accounting for rotation. Dual-chamber pacer leads from the left are in stable position. IMPRESSION: Pulmonary fibrosis. When compared to prior, no evidence of acute superimposed disease. Electronically Signed   By: Marnee Spring M.D.   On: 06/02/2017 16:51   Ct Angio Chest Pe W And/or Wo Contrast  Addendum Date: 06/02/2017   ADDENDUM REPORT: 06/02/2017 18:41 ADDENDUM: Omitted from the initial dictation is presence of calcified pleural plaque formation in the posterior upper LEFT hemithorax; this can be seen with  prior hemothorax, empyema, TB, or asbestos exposure. No definite RIGHT-side calcified pleural plaque formation seen. Electronically Signed   By: Ulyses Southward M.D.   On: 06/02/2017 18:41   Result Date: 06/02/2017 CLINICAL DATA:  Increasing shortness of breath over the past 2 weeks worse with exertion, worsening dyspnea, history pulmonary fibrosis, hypertension, former smoker EXAM: CT ANGIOGRAPHY CHEST WITH CONTRAST TECHNIQUE: Multidetector CT imaging of the chest was performed using the standard protocol during bolus administration of intravenous contrast. Multiplanar CT image reconstructions and MIPs were obtained to evaluate the vascular anatomy. CONTRAST:  75 cc Isovue 370 IV COMPARISON:  CT chest without contrast 10/13/2011 FINDINGS: Cardiovascular: Atherosclerotic calcifications aorta, proximal great vessels and coronary arteries. Ascending thoracic aorta upper normal caliber 3.8 cm diameter. Mild enlargement of cardiac chambers. Pulmonary arteries well opacified and patent. Beam hardening artifacts at RIGHT hilum from dense contrast in SVC. No definite evidence of pulmonary embolism. Mediastinum/Nodes: Small hiatal hernia. Scattered gas distention of thoracic esophagus. Base of cervical region unremarkable. Few scattered normal sized cervical lymph nodes without thoracic adenopathy. Lungs/Pleura: Scattered peripheral and subpleural pulmonary fibrosis changes throughout both lungs with basilar predominance. Honeycomb formation at lung bases. No acute infiltrate, pleural effusion or pneumothorax. Upper Abdomen: Hepatic cysts. Remaining visualized upper abdomen unremarkable. Musculoskeletal: Osseous  demineralization.  No acute bony findings. Review of the MIP images confirms the above findings. IMPRESSION: No evidence of pulmonary embolism. Small hiatal hernia. Pulmonary fibrosis. Hepatic cysts. Extensive atherosclerotic calcifications aorta, coronary arteries and proximal great vessels with upper normal caliber  of ascending thoracic aorta. Aortic Atherosclerosis (ICD10-I70.0). Electronically Signed: By: Ulyses Southward M.D. On: 06/02/2017 18:32    EKG: Orders placed or performed during the hospital encounter of 06/02/17  . ED EKG within 10 minutes  . ED EKG within 10 minutes    IMPRESSION AND PLAN:  * Acute bronchitis   Chronic pulmonary fibrosis   We'll give IV ceftriaxone azithromycin.   IV steroid and nebulizer therapy.   Pulmonary consult with her primary pulmonary team.  * Atrial fibrillation   Rate is controlled with Cardizem, continue anti-coagulation also.  * Status post pacemaker  * Hyperlipidemia   Continue atorvastatin.  * Hypertension   Continue home medications.   All the records are reviewed and case discussed with ED provider. Management plans discussed with the patient, family and they are in agreement.  CODE STATUS: full Code Status History    Date Active Date Inactive Code Status Order ID Comments User Context   08/26/2013  6:45 PM 08/27/2013  2:44 PM Full Code [id number]  Marinus Maw, MD Inpatient   08/25/2013  1:29 PM 08/26/2013  6:45 PM Full Code [id number]  Minda Meo, PA-C Inpatient     Patient's son and girlfriend were present in the room during my visit.  TOTAL TIME TAKING CARE OF THIS PATIENT: 50 minutes.    Altamese Dilling M.D on 06/02/2017   Between 7am to 6pm - Pager - [phone]  After 6pm go to [email or web address] - password [email or web address]  [facility] [facility]  Office  [phone]  CC: Primary care physician; Rafael Bihari, MD   Note: This dictation was prepared with Dragon dictation along with smaller phrase technology. Any transcriptional errors that result from this process are unintentional.

## 2017-06-02 NOTE — ED Triage Notes (Signed)
Pt c/o increased SOB for the past 2 weeks, worse with exursion, pt is in NAD on arrival.

## 2017-06-03 ENCOUNTER — Encounter: Payer: Self-pay | Admitting: Cardiology

## 2017-06-03 DIAGNOSIS — J8410 Pulmonary fibrosis, unspecified: Secondary | ICD-10-CM | POA: Diagnosis not present

## 2017-06-03 LAB — CUP PACEART REMOTE DEVICE CHECK
Battery Remaining Longevity: 115 mo
Battery Remaining Percentage: 95.5 %
Battery Voltage: 2.99 V
Brady Statistic AS VS Percent: 54 %
Brady Statistic RV Percent Paced: 13 %
Date Time Interrogation Session: 20180717060018
Implantable Lead Implant Date: 20141010
Implantable Pulse Generator Implant Date: 20141010
Lead Channel Pacing Threshold Amplitude: 0.75 V
Lead Channel Pacing Threshold Pulse Width: 0.4 ms
Lead Channel Sensing Intrinsic Amplitude: 3.9 mV
Lead Channel Setting Sensing Sensitivity: 1 mV
MDC IDC LEAD IMPLANT DT: 20141010
MDC IDC LEAD LOCATION: 753859
MDC IDC LEAD LOCATION: 753860
MDC IDC MSMT LEADCHNL RA IMPEDANCE VALUE: 450 Ohm
MDC IDC MSMT LEADCHNL RA PACING THRESHOLD PULSEWIDTH: 0.6 ms
MDC IDC MSMT LEADCHNL RV IMPEDANCE VALUE: 340 Ohm
MDC IDC MSMT LEADCHNL RV PACING THRESHOLD AMPLITUDE: 1.25 V
MDC IDC MSMT LEADCHNL RV SENSING INTR AMPL: 2.3 mV
MDC IDC SET LEADCHNL RA PACING AMPLITUDE: 2 V
MDC IDC SET LEADCHNL RV PACING AMPLITUDE: 2.5 V
MDC IDC SET LEADCHNL RV PACING PULSEWIDTH: 0.4 ms
MDC IDC STAT BRADY AP VP PERCENT: 1.3 %
MDC IDC STAT BRADY AP VS PERCENT: 33 %
MDC IDC STAT BRADY AS VP PERCENT: 12 %
MDC IDC STAT BRADY RA PERCENT PACED: 34 %
Pulse Gen Model: 2240
Pulse Gen Serial Number: 2980518

## 2017-06-03 LAB — CBC
HCT: 36.2 % — ABNORMAL LOW (ref 40.0–52.0)
HEMOGLOBIN: 12.2 g/dL — AB (ref 13.0–18.0)
MCH: 30.2 pg (ref 26.0–34.0)
MCHC: 33.7 g/dL (ref 32.0–36.0)
MCV: 89.6 fL (ref 80.0–100.0)
Platelets: 251 10*3/uL (ref 150–440)
RBC: 4.04 MIL/uL — AB (ref 4.40–5.90)
RDW: 13.3 % (ref 11.5–14.5)
WBC: 6.4 10*3/uL (ref 3.8–10.6)

## 2017-06-03 LAB — BASIC METABOLIC PANEL
ANION GAP: 9 (ref 5–15)
BUN: 16 mg/dL (ref 6–20)
CALCIUM: 8.5 mg/dL — AB (ref 8.9–10.3)
CO2: 21 mmol/L — AB (ref 22–32)
Chloride: 106 mmol/L (ref 101–111)
Creatinine, Ser: 0.94 mg/dL (ref 0.61–1.24)
GFR calc non Af Amer: >60 mL/min (ref >60)
Glucose, Bld: 275 mg/dL — ABNORMAL HIGH (ref 65–99)
Potassium: 2.8 mmol/L — ABNORMAL LOW (ref 3.5–5.1)
SODIUM: 136 mmol/L (ref 135–145)

## 2017-06-03 LAB — POTASSIUM: Potassium: 3.1 mmol/L — ABNORMAL LOW (ref 3.5–5.1)

## 2017-06-03 LAB — MAGNESIUM: Magnesium: 1.9 mg/dL (ref 1.7–2.4)

## 2017-06-03 MED ORDER — POTASSIUM CHLORIDE CRYS ER 20 MEQ PO TBCR
40.0000 meq | EXTENDED_RELEASE_TABLET | Freq: Every day | ORAL | Status: DC
  Administered 2017-06-03: 40 meq via ORAL
  Filled 2017-06-03: qty 2

## 2017-06-03 MED ORDER — LEVOFLOXACIN 500 MG PO TABS: 500.0000 mg | ORAL_TABLET | Freq: Every day | ORAL | 0 refills | Status: DC

## 2017-06-03 MED ORDER — ENSURE ENLIVE PO LIQD: 237.0000 mL | Freq: Three times a day (TID) | ORAL | 12 refills | Status: DC

## 2017-06-03 MED ORDER — POTASSIUM CHLORIDE CRYS ER 20 MEQ PO TBCR: 40.0000 meq | EXTENDED_RELEASE_TABLET | Freq: Every day | ORAL | 0 refills | Status: DC

## 2017-06-03 MED ORDER — POTASSIUM CHLORIDE CRYS ER 20 MEQ PO TBCR: 30.0000 meq | EXTENDED_RELEASE_TABLET | Freq: Once | ORAL | Status: DC

## 2017-06-03 MED ORDER — ENSURE ENLIVE PO LIQD: 237.0000 mL | Freq: Two times a day (BID) | ORAL | Status: DC

## 2017-06-03 MED ORDER — ENSURE ENLIVE PO LIQD
237.0000 mL | Freq: Three times a day (TID) | ORAL | Status: DC
  Administered 2017-06-03 (×2): 237 mL via ORAL

## 2017-06-03 MED ORDER — IPRATROPIUM-ALBUTEROL 0.5-2.5 (3) MG/3ML IN SOLN: 3.0000 mL | RESPIRATORY_TRACT | 1 refills | Status: DC

## 2017-06-03 MED ORDER — METHYLPREDNISOLONE SODIUM SUCC 125 MG IJ SOLR: 60.0000 mg | Freq: Every day | INTRAMUSCULAR | Status: DC

## 2017-06-03 MED ORDER — LEVOFLOXACIN 500 MG PO TABS
500.0000 mg | ORAL_TABLET | Freq: Every day | ORAL | Status: DC
  Administered 2017-06-03: 500 mg via ORAL
  Filled 2017-06-03: qty 1

## 2017-06-03 MED ORDER — POTASSIUM CHLORIDE 10 MEQ/100ML IV SOLN
10.0000 meq | INTRAVENOUS | Status: AC
  Administered 2017-06-03 (×2): 10 meq via INTRAVENOUS
  Filled 2017-06-03 (×2): qty 100

## 2017-06-03 MED ORDER — DIAZEPAM 5 MG PO TABS
5.0000 mg | ORAL_TABLET | Freq: Once | ORAL | Status: AC
  Administered 2017-06-03: 03:00:00 5 mg via ORAL
  Filled 2017-06-03: qty 1

## 2017-06-03 MED ORDER — POTASSIUM CHLORIDE CRYS ER 20 MEQ PO TBCR
40.0000 meq | EXTENDED_RELEASE_TABLET | Freq: Once | ORAL | Status: AC
  Administered 2017-06-03: 08:00:00 40 meq via ORAL
  Filled 2017-06-03: qty 2

## 2017-06-03 NOTE — Progress Notes (Signed)
PT Cancellation Note  Patient Details Name: Larene PickettWillard L Fischbach MRN: [id number] DOB: 1925/02/14   Cancelled Treatment:    Reason Eval/Treat Not Completed: Patient declined, no reason specified; Pt declined PT services.  Will complete PT notes but will re-assess pending change in status upon receipt of new PT orders.     Thomes Dinningavid Scott Abie Killian 06/03/2017, 3:10 PM

## 2017-06-03 NOTE — Progress Notes (Signed)
Initial Nutrition Assessment  DOCUMENTATION CODES:   Not applicable  INTERVENTION:  Recommend Ensure Enlive po BID, each supplement provides 350 kcal and 20 grams of protein.   Discussed with patient to increase his protein shake at home to BID instead of once per day.  Encouraged adequate intake of calories and protein at meals.  Continue daily MVI at home.  NUTRITION DIAGNOSIS:   Inadequate oral intake related to poor appetite as evidenced by per patient/family report.  GOAL:   Patient will meet greater than or equal to 90% of their needs  MONITOR:   PO intake, Supplement acceptance, Labs, Weight trends, I & O's  REASON FOR ASSESSMENT:   Malnutrition Screening Tool, Consult Assessment of nutrition requirement/status  ASSESSMENT:   83 year old male with PMHx of HLD, HTN, pulmonary fibrosis, orthostatic hypotension, PAF, hx pacemaker insertion who presented with shortness of breath and found to have acute bronchitis.   Spoke with patient and family members at bedside. Patient reports his appetite has been poor for approximately 6 months now. He reports one of his doctors prescribed him Remeron yesterday so he is hoping it will improve his appetite. He eats two meals per day. For breakfast he has a boiled egg, orange juice, and a protein shake made with milk and protein powder (son believes it may have 20-25 grams of protein in it). For dinner his girlfriend brings over a homemade meal with meat and sides/vegetables. He reports he eats apples or peaches as snacks during the day. Patient is concerned that he just has not felt as hungry lately. He takes MVI and potassium (over the counter) at home daily.   UBW 170 lbs. RD obtained bed scale weight of 167.7 lbs. Noted in chart patient was 165.5 lbs on 05/25/2017. He has lost approximately 4.5 lbs (2.6% body weight) over the past 4-6 months, which is not significant for time frame.  Meal Completion: 90%  Medications reviewed and  include: Oscal with D 1 tablet daily, Colace, Remeron, pantoprazole, potassium chloride 40 mEq daily.   Labs reviewed: Potassium 3.1 (trending up from 2.8 this morning), CO2 21.   Nutrition-Focused physical exam completed. Findings are mild-moderate fat depletion, mild-moderate muscle depletion in temporal region, and no edema.  Patient does not meet criteria for malnutrition at this time.   Diet Order:  Diet Heart Room service appropriate? Yes; Fluid consistency: Thin Diet general  Skin:  Reviewed, no issues  Last BM:  06/02/2017  Height:   Ht Readings from Last 1 Encounters:  06/02/17 5\' 8"  (1.727 m)    Weight:   Wt Readings from Last 1 Encounters:  06/03/17 167 lb 11.2 oz (76.1 kg)    Ideal Body Weight:  70 kg  BMI:  Body mass index is 25.5 kg/m.  Estimated Nutritional Needs:   Kcal:  1690-1970 (MSJ x 1.2-1.4)  Protein:  76-90 grams (1-1.2 grams/kg)  Fluid:  1.9 L/day (25 ml/kg)  EDUCATION NEEDS:   Education needs addressed  Helane RimaLeanne Chryl Holten, MS, RD, LDN Pager: [phone] After Hours Pager: [phone]

## 2017-06-03 NOTE — Progress Notes (Signed)
SATURATION QUALIFICATIONS: (This note is used to comply with regulatory documentation for home oxygen)  Patient Saturations on 2 liters of oxygen while Ambulating = 95%  Bo McclintockBrewer,Pesach Frisch S, RN

## 2017-06-03 NOTE — Care Management (Signed)
Discharge to home today per Dr. Enedina FinnerSona Patel. Qualifying saturations for home oxygen and the diagnosis of COPD. Home nebulizer ordered. [facility] will provide this equipment. Family will transport. Gwenette GreetBrenda S Cayle Cordoba RN MSN CCM Care Management [phone]

## 2017-06-03 NOTE — Progress Notes (Signed)
Discharge instructions given and went over with patient and family at bedside. Prescriptions reviewed. All questions answered. Patient discharged home with family via wheelchair by nursing staff Bo McclintockBrewer,Shaylynne Lunt S, RN

## 2017-06-03 NOTE — Progress Notes (Addendum)
MEDICATION RELATED CONSULT NOTE - INITIAL   Pharmacy Consult for electrolyte management Indication: hypokalemia  No Known Allergies  Patient Measurements: Height: 5\' 8"  (172.7 cm) Weight: 160 lb (72.6 kg) IBW/kg (Calculated) : 68.4  Vital Signs: Temp: 97.6 F (36.4 C) (07/18 1356) Temp Source: Oral (07/18 1356) BP: 156/78 (07/18 1356) Pulse Rate: 93 (07/18 1356) Intake/Output from previous day: 07/17 0701 - 07/18 0700 In: 300 [IV Piggyback:300] Out: 1675 [Urine:1675] Intake/Output from this shift: Total I/O In: 240 [P.O.:240] Out: 550 [Urine:550]  Labs:  Recent Labs  06/02/17 1516 06/03/17 0457  WBC 16.7* 6.4  HGB 11.8* 12.2*  HCT 34.3* 36.2*  PLT 272 251  CREATININE 1.10 0.94  MG  --  1.9   Assessment: Pharmacy consulted to assist with monitoring/replacing electrolytes.   K = 3.1 after repletion  Goal of Therapy: Electrolytes WNL  Plan:  Patient already has orders for 40 mEq PO daily scheduled for this afternoon.  Cindi CarbonMary M Michaiah Holsopple, PharmD Clinical Pharmacist 06/03/2017,2:52 PM

## 2017-06-03 NOTE — Care Management Note (Addendum)
Case Management Note  Patient Details  Name: Larene PickettWillard L Opperman MRN: [id number] Date of Birth: 1925-09-09  Subjective/Objective:  Admitted to [facility] under observation status with the diagnosis of bronchitis. Lives alone. Son is Richardean SaleBlake Niebuhr [phone]  Last seen Dr. Yates DecampJohn Walker 06/02/17. Prescriptions are filled per mail order at [facility] [place]. No home Health. No skilled Facility. No home oxygen. Rolling walker, cane, grab bars and wheelchair in the home.   Takes care of all basic activities of daily living himself, drives. No falls. Decreased appetite for 6-8 months. Lost 5-6 pounds. Supportive family.                  Action/Plan: Acute oxygen. Possibly will need oxygen challenge test. Pulmonary consult.   Expected Discharge Date:                  Expected Discharge Plan:     In-House Referral:     Discharge planning Services     Post Acute Care Choice:    Choice offered to:     DME Arranged:    DME Agency:     HH Arranged:    HH Agency:     Status of Service:     If discussed at [employer] Meetings, dates discussed:    Additional Comments:  Gwenette GreetBrenda S Aliah Eriksson, RN MSN CCM Care Management [phone] 06/03/2017, 9:42 AM

## 2017-06-03 NOTE — Plan of Care (Signed)
Problem: Education: Goal: Knowledge of [facility] Education information/materials will improve Outcome: Progressing VS WDL, free of falls during shift.  Requested "protein shake" and sleep aid, Dr. Sheryle Hailiamond paged, received ordered Ensure, one-time PO Valium 5mg .  No other needs overnight, denies pain.  Bed in low position, call bell within reach.  WCTM.

## 2017-06-03 NOTE — Progress Notes (Signed)
MEDICATION RELATED CONSULT NOTE - INITIAL   Pharmacy Consult for electrolyte management Indication: hypokalemia  No Known Allergies  Patient Measurements: Height: 5\' 8"  (172.7 cm) Weight: 160 lb (72.6 kg) IBW/kg (Calculated) : 68.4  Vital Signs: Temp: 97.7 F (36.5 C) (07/18 0325) Temp Source: Oral (07/17 2148) BP: 112/63 (07/18 0325) Pulse Rate: 65 (07/18 0325) Intake/Output from previous day: 07/17 0701 - 07/18 0700 In: 300 [IV Piggyback:300] Out: 1675 [Urine:1675] Intake/Output from this shift: Total I/O In: -  Out: 250 [Urine:250]  Labs:  Recent Labs  06/02/17 1516 06/03/17 0457  WBC 16.7* 6.4  HGB 11.8* 12.2*  HCT 34.3* 36.2*  PLT 272 251  CREATININE 1.10 0.94  MG  --  1.9   Assessment: Pharmacy consulted to assist with monitoring/replacing electrolytes.   K = 2.8 Mg = 1.9  Goal of Therapy: Electrolytes WNL  Plan:  Ordered KCl 40 mEq PO x 1 and 20 mEq IV. Will recheck K at 1300 and other electrolytes with AM labs tomorrow.  Cindi CarbonMary M Cianna Kasparian, PharmD Clinical Pharmacist 06/03/2017,9:30 AM

## 2017-06-03 NOTE — Progress Notes (Signed)
Discussed the need to attempt to wean off of oxygen with patient. Patient refusing at this time until he is seen by pulmonology. As outpatient, patients states that oxygen was offered to him by his pulmonologist - Simonds. MD made aware. Pulmonology consult in place. Bo McclintockBrewer,Arshawn Valdez S, RN

## 2017-06-03 NOTE — Discharge Summary (Addendum)
[facility] - [place] at [facility]   PATIENT NAME: Walter Townsend    MR#:  [id number]  DATE OF BIRTH:  [birth year]  DATE OF ADMISSION:  06/02/2017 ADMITTING PHYSICIAN: Altamese Dilling, MD  DATE OF DISCHARGE: 06/03/17  PRIMARY CARE PHYSICIAN: Rafael Bihari, MD    ADMISSION DIAGNOSIS:  Shortness of breath [R06.02] Hyponatremia [E87.1] Elevated troponin I level [R74.8] Leukocytosis, unspecified type [D72.829]  DISCHARGE DIAGNOSIS:  Acute on chronic respiratory failure secondary to severe bilateral pulmonary fibrosis/COPD Mild acute bronchitis Hypoxemia now requiring oxygen hypokalemia SECONDARY DIAGNOSIS:   Past Medical History:  Diagnosis Date  . Cardiac pacemaker [facility] [employer]   . Hyperlipidemia   . Hypertension   . Orthostatic hypotension 08/22/2014  . PAF (paroxysmal atrial fibrillation) (HCC)   . Prostate enlargement   . Pulmonary fibrosis [facility])     HOSPITAL COURSE:  BRYSTEN REISTER is a 83 y.o. male who presents to the ED for increasing shortness of breath over the last 2 weeks which is worse with exertion. Patient reports recently being seen by his doctor and advise she has pulmonary fibrosis that is progressing throughout his lungs.  * Increasing shortness of breath secondary to severe pulmonary fibrosis with hypoxemia and mild Acute bronchitis   Chronic pulmonary fibrosis -We'll give IV ceftriaxone azithromycin--- changed to oral Levaquin -received IV steroid and nebulizer therapy. Per Dr. Sharol Harness no need for oral steroids at present. Appreciated input from pulmonary attending -Patient sats on room air with ambulation dropped to 86%. Patient still requires oxygen despite being on inhalers and nebulizer treatment - He will discharge with oxygen and home nebulizer  * Atrial fibrillation  - Rate is controlled with Cardizem, continue anti-coagulation also.  * Status post pacemaker  * Hyperlipidemia   Continue  atorvastatin.  * Hypertension   Continue home medications.  * Hypokalemia 2.8---IV and po KCL---3.1---will prescribe Kdur 40 meq qd for 3 days  Overall seems to be improving slowly. Patient will be set up with home oxygen and nebulizer and follow up with Dr. Sharol Harness July 30.  This was discussed with patient's daughter and son.  CONSULTS OBTAINED:  Treatment Team:  Merwyn Katos, MD  DRUG ALLERGIES:  No Known Allergies  DISCHARGE MEDICATIONS:   Current Discharge Medication List    START taking these medications   Details  feeding supplement, ENSURE ENLIVE, (ENSURE ENLIVE) LIQD Take 237 mLs by mouth 3 (three) times daily with meals. Qty: 237 mL, Refills: 12    ipratropium-albuterol (DUONEB) 0.5-2.5 (3) MG/3ML SOLN Take 3 mLs by nebulization every 4 (four) hours. Qty: 360 mL, Refills: 1    levofloxacin (LEVAQUIN) 500 MG tablet Take 1 tablet (500 mg total) by mouth daily. Qty: 5 tablet, Refills: 0    potassium chloride SA (K-DUR,KLOR-CON) 20 MEQ tablet Take 2 tablets (40 mEq total) by mouth daily. Qty: 4 tablet, Refills: 0      CONTINUE these medications which have NOT CHANGED   Details  atorvastatin (LIPITOR) 20 MG tablet Take 1 tablet (20 mg total) by mouth daily. Qty: 90 tablet, Refills: 3    Calcium Carbonate-Vitamin D (CALCIUM-VITAMIN D) 600-200 MG-UNIT CAPS Take 1 capsule by mouth daily.      cyanocobalamin 100 MCG tablet Take 100 mcg by mouth daily.    diltiazem (CARDIZEM CD) 120 MG 24 hr capsule Take 1 capsule (120 mg total) by mouth daily. Qty: 90 capsule, Refills: 3    docusate sodium (COLACE) 100 MG capsule Take 200 mg by  mouth at bedtime.     finasteride (PROSCAR) 5 MG tablet Take 1 tablet (5 mg total) by mouth daily.    lansoprazole (PREVACID) 30 MG capsule Take 1 capsule (30 mg total) by mouth daily. Qty: 90 capsule, Refills: 3    lisinopril (ZESTRIL) 2.5 MG tablet Take 1 tablet (2.5 mg total) by mouth daily. Qty: 90 tablet, Refills: 3     meclizine (ANTIVERT) 25 MG tablet Take 1 tablet (25 mg total) by mouth 3 (three) times daily as needed for dizziness or nausea. Qty: 30 tablet, Refills: 1    mirtazapine (REMERON) 7.5 MG tablet Take 7.5 mg by mouth at bedtime.    rivaroxaban (XARELTO) 20 MG TABS tablet Take 1 tablet (20 mg total) by mouth daily with supper. Qty: 90 tablet, Refills: 3    desmopressin (DDAVP) 0.1 MG tablet Take 1 tablet (0.1 mg total) by mouth daily. Qty: 90 tablet, Refills: 1      STOP taking these medications     diazepam (VALIUM) 5 MG tablet         If you experience worsening of your admission symptoms, develop shortness of breath, life threatening emergency, suicidal or homicidal thoughts you must seek medical attention immediately by calling 911 or calling your MD immediately  if symptoms less severe.  You Must read complete instructions/literature along with all the possible adverse reactions/side effects for all the Medicines you take and that have been prescribed to you. Take any new Medicines after you have completely understood and accept all the possible adverse reactions/side effects.   Please note  You were cared for by a hospitalist during your hospital stay. If you have any questions about your discharge medications or the care you received while you were in the hospital after you are discharged, you can call the unit and asked to speak with the hospitalist on call if the hospitalist that took care of you is not available. Once you are discharged, your primary care physician will handle any further medical issues. Please note that NO REFILLS for any discharge medications will be authorized once you are discharged, as it is imperative that you return to your primary care physician (or establish a relationship with a primary care physician if you do not have one) for your aftercare needs so that they can reassess your need for medications and monitor your lab values. Today   SUBJECTIVE   Feeling fatigued tired and noticed pain drops in oxygen with pulse ox meter at home Cough with productive phlegm   VITAL SIGNS:  Blood pressure (!) 156/78, pulse 93, temperature 97.6 F (36.4 C), temperature source Oral, resp. rate 18, height 5\' 8"  (1.727 m), weight 72.6 kg (160 lb), SpO2 100 %.  I/O:    Intake/Output Summary (Last 24 hours) at 06/03/17 1450 Last data filed at 06/03/17 1354  Gross per 24 hour  Intake              540 ml  Output             2225 ml  Net            -1685 ml    PHYSICAL EXAMINATION:  GENERAL:  83 y.o.-year-old patient lying in the bed with no acute distress. Thin EYES: Pupils equal, round, reactive to light and accommodation. No scleral icterus. Extraocular muscles intact.  HEENT: Head atraumatic, normocephalic. Oropharynx and nasopharynx clear.  NECK:  Supple, no jugular venous distention. No thyroid enlargement, no tenderness.  LUNGS: Distant breath  sounds bilaterally, no wheezing, bibasilar rales up to mid lung, no rhonchi or crepitation. No use of accessory muscles of respiration.  CARDIOVASCULAR: S1, S2 normal. No murmurs, rubs, or gallops.  ABDOMEN: Soft, non-tender, non-distended. Bowel sounds present. No organomegaly or mass.  EXTREMITIES: No pedal edema, cyanosis, or clubbing.  NEUROLOGIC: Cranial nerves II through XII are intact. Muscle strength 5/5 in all extremities. Sensation intact. Gait not checked.  PSYCHIATRIC: The patient is alert and oriented x 3.  SKIN: No obvious rash, lesion, or ulcer.   DATA REVIEW:   CBC   Recent Labs Lab 06/03/17 0457  WBC 6.4  HGB 12.2*  HCT 36.2*  PLT 251    Chemistries   Recent Labs Lab 06/03/17 0457 06/03/17 1354  NA 136  --   K 2.8* 3.1*  CL 106  --   CO2 21*  --   GLUCOSE 275*  --   BUN 16  --   CREATININE 0.94  --   CALCIUM 8.5*  --   MG 1.9  --     Microbiology Results   No results found for this or any previous visit (from the past 240 hour(s)).  RADIOLOGY:  Dg Chest  2 View  Result Date: 06/02/2017 CLINICAL DATA:  Shortness of breath EXAM: CHEST  2 VIEW COMPARISON:  01/15/2017 FINDINGS: Basilar predominant coarse interstitial opacities and mildly low lung volumes. No superimposed acute opacity. No effusion or pneumothorax. Stable mild cardiomegaly. Stable aortic contours accounting for rotation. Dual-chamber pacer leads from the left are in stable position. IMPRESSION: Pulmonary fibrosis. When compared to prior, no evidence of acute superimposed disease. Electronically Signed   By: Marnee SpringJonathon  Watts M.D.   On: 06/02/2017 16:51   Ct Angio Chest Pe W And/or Wo Contrast  Addendum Date: 06/02/2017   ADDENDUM REPORT: 06/02/2017 18:41 ADDENDUM: Omitted from the initial dictation is presence of calcified pleural plaque formation in the posterior upper LEFT hemithorax; this can be seen with prior hemothorax, empyema, TB, or asbestos exposure. No definite RIGHT-side calcified pleural plaque formation seen. Electronically Signed   By: Ulyses SouthwardMark  Boles M.D.   On: 06/02/2017 18:41   Result Date: 06/02/2017 CLINICAL DATA:  Increasing shortness of breath over the past 2 weeks worse with exertion, worsening dyspnea, history pulmonary fibrosis, hypertension, former smoker EXAM: CT ANGIOGRAPHY CHEST WITH CONTRAST TECHNIQUE: Multidetector CT imaging of the chest was performed using the standard protocol during bolus administration of intravenous contrast. Multiplanar CT image reconstructions and MIPs were obtained to evaluate the vascular anatomy. CONTRAST:  75 cc Isovue 370 IV COMPARISON:  CT chest without contrast 10/13/2011 FINDINGS: Cardiovascular: Atherosclerotic calcifications aorta, proximal great vessels and coronary arteries. Ascending thoracic aorta upper normal caliber 3.8 cm diameter. Mild enlargement of cardiac chambers. Pulmonary arteries well opacified and patent. Beam hardening artifacts at RIGHT hilum from dense contrast in SVC. No definite evidence of pulmonary embolism.  Mediastinum/Nodes: Small hiatal hernia. Scattered gas distention of thoracic esophagus. Base of cervical region unremarkable. Few scattered normal sized cervical lymph nodes without thoracic adenopathy. Lungs/Pleura: Scattered peripheral and subpleural pulmonary fibrosis changes throughout both lungs with basilar predominance. Honeycomb formation at lung bases. No acute infiltrate, pleural effusion or pneumothorax. Upper Abdomen: Hepatic cysts. Remaining visualized upper abdomen unremarkable. Musculoskeletal: Osseous demineralization.  No acute bony findings. Review of the MIP images confirms the above findings. IMPRESSION: No evidence of pulmonary embolism. Small hiatal hernia. Pulmonary fibrosis. Hepatic cysts. Extensive atherosclerotic calcifications aorta, coronary arteries and proximal great vessels with upper normal caliber of ascending  thoracic aorta. Aortic Atherosclerosis (ICD10-I70.0). Electronically Signed: By: Ulyses Southward M.D. On: 06/02/2017 18:32     Management plans discussed with the patient, family and they are in agreement.  CODE STATUS:     Code Status Orders        Start     Ordered   06/02/17 2149  Full code  Continuous     06/02/17 2148    Code Status History    Date Active Date Inactive Code Status Order ID Comments User Context   08/26/2013  6:45 PM 08/27/2013  2:44 PM Full Code [id number]  Marinus Maw, MD Inpatient   08/25/2013  1:29 PM 08/26/2013  6:45 PM Full Code [id number]  Edmisten, Herby Abraham, PA-C Inpatient      TOTAL TIME TAKING CARE OF THIS PATIENT: *40* minutes.    Lamine Laton M.D on 06/03/2017 at 2:50 PM  Between 7am to 6pm - Pager - [phone] After 6pm go to [email or web address] - password [employer]  [facility]  Office  [phone]  CC: Primary care physician; Rafael Bihari, MD

## 2017-06-03 NOTE — Progress Notes (Signed)
SATURATION QUALIFICATIONS: (This note is used to comply with regulatory documentation for home oxygen)  Patient Saturations on Room Air at Rest = 96%  Patient Saturations on Room Air while Ambulating = 86%  Bo McclintockBrewer,Greysen Swanton S, RN

## 2017-06-03 NOTE — Discharge Instructions (Signed)
Use oxygen 2 L/m continuous. Use your nebulizer as needed 3-4 times a day.

## 2017-06-03 NOTE — Care Management Obs Status (Signed)
MEDICARE OBSERVATION STATUS NOTIFICATION   Patient Details  Name: Walter Townsend MRN: [id number] Date of Birth: January 26, 1925   Medicare Observation Status Notification Given:  Yes    Gwenette GreetBrenda S Deshunda Thackston, RN 06/03/2017, 9:41 AM

## 2017-06-05 ENCOUNTER — Telehealth: Payer: Self-pay | Admitting: Cardiovascular Disease

## 2017-06-05 NOTE — Telephone Encounter (Signed)
Left voicemail message to call back  

## 2017-06-05 NOTE — Telephone Encounter (Signed)
Patient wants dr. Mariah MillingGollan to know he is not having a good day and his heart rate keeps going up and down.

## 2017-06-08 ENCOUNTER — Ambulatory Visit (INDEPENDENT_AMBULATORY_CARE_PROVIDER_SITE_OTHER): Payer: Medicare Other | Admitting: Pulmonary Disease

## 2017-06-08 ENCOUNTER — Encounter: Payer: Self-pay | Admitting: Pulmonary Disease

## 2017-06-08 VITALS — BP 120/64 | HR 77 | Ht 68.0 in

## 2017-06-08 DIAGNOSIS — J8410 Pulmonary fibrosis, unspecified: Secondary | ICD-10-CM

## 2017-06-08 DIAGNOSIS — J9611 Chronic respiratory failure with hypoxia: Secondary | ICD-10-CM | POA: Diagnosis not present

## 2017-06-08 NOTE — Patient Instructions (Addendum)
Continue oxygen therapy as close to 24 hours per day as possible  Use on the pulse setting (2) at rest   Use on the continuous setting (cf2) with exertion  Change the breathing treatments (DuoNeb) to as needed only. You do not have to take this on a scheduled basis. If you cannot tell that it is helping your breathing, do not use it.  Follow-up in 6 months or sooner as needed with chest x-ray prior to that visit

## 2017-06-08 NOTE — Progress Notes (Signed)
PULMONARY OFFICE FOLLOW-UP NOTE  Requesting MD/Service: Mariah MillingGollan Date of initial consultation: 01/15/2017 Reason for consultation: Pulmonary fibrosis  PT PROFILE: 83 y.o. M remote smoker with slowly progressive pulmonary fibrosis and CT chest pattern c/w UIP/IPF. Previously followed by Dr Kendrick FriesMcQuaid  DATA: 10/13/2011 CT chest: Pattern consistent with mild UIP  10/17/2011 PFTs: No obstruction, normal TLC and DLCO 06/02/17 CT chest: Progression of pulmonary fibrosis compared to prior study in 2012. No acute infiltrates or edema.  INTERVAL HISTORY: Hospitalized 07/17-07/18 for dyspnea. Treated as bronchitis. Started on nasal cannula oxygen  SUBJ:  Overall no major changes since initial evaluation of March. However he was hospitalized in July as noted above. He feels that initiating oxygen therapy has been significantly beneficial. He was also discharged to home on nebulized DuoNeb which is not providing him any benefit. Denies CP, fever, purulent sputum, hemoptysis, LE edema and calf tenderness.   OBJ:  Vitals:   06/08/17 1421 06/08/17 1425  BP:  120/64  Pulse:  77  SpO2:  95%  Height: 5\' 8"  (1.727 m)   2 LPM pulse [place] O2   EXAM:  Gen: NAD HEENT: WNL Neck: No JVD noted Lungs: Coarse bilateral crackles, no wheezes Cardiovascular: Regular, no murmurs Abdomen: Soft, nontender, normal BS Ext: No C/C/E Neuro: No focal deficits  DATA:   BMP Latest Ref Rng & Units 06/03/2017 06/03/2017 06/02/2017  Glucose 65 - 99 mg/dL - 161(W275(H) 960(A111(H)  BUN 6 - 20 mg/dL - 16 54(U21(H)  Creatinine 0.61 - 1.24 mg/dL - 9.810.94 1.911.10  BUN/Creat Ratio 10 - 24 - - -  Sodium 135 - 145 mmol/L - 136 126(L)  Potassium 3.5 - 5.1 mmol/L 3.1(L) 2.8(L) 4.0  Chloride 101 - 111 mmol/L - 106 96(L)  CO2 22 - 32 mmol/L - 21(L) 22  Calcium 8.9 - 10.3 mg/dL - 8.5(L) 8.6(L)    CBC Latest Ref Rng & Units 06/03/2017 06/02/2017 02/09/2017  WBC 3.8 - 10.6 K/uL 6.4 16.7(H) 9.7  Hemoglobin 13.0 - 18.0 g/dL 12.2(L) 11.8(L) 13.8   Hematocrit 40.0 - 52.0 % 36.2(L) 34.3(L) 40.2  Platelets 150 - 440 K/uL 251 272 216    CXR (serous 06/02/17):  Mild progression of interstitial prominence compared to prior study of 01/15/17  IMPRESSION:     ICD-10-CM   1. Chronic hypoxemic respiratory failure (HCC) J96.11   2. Pulmonary fibrosis (HCC) J84.10    It is doubtful that he is getting any benefit from nebulized bronchodilators and there is some risk of tachyarrhythmias  PLAN:  Continue oxygen therapy as close to 24 hours per day as possible  Use on the pulse setting (2) at rest   Use on the continuous setting (cf2) with exertion  Change the breathing treatments (DuoNeb) to as needed only.   Follow-up in 6 months or sooner as needed with chest x-ray prior to that visit   Billy Fischeravid Simonds, MD PCCM service Mobile [phone] Pager [phone] 06/08/2017

## 2017-06-11 ENCOUNTER — Other Ambulatory Visit: Payer: Self-pay | Admitting: Nurse Practitioner

## 2017-06-11 DIAGNOSIS — R7401 Elevation of levels of liver transaminase levels: Secondary | ICD-10-CM

## 2017-06-11 DIAGNOSIS — R748 Abnormal levels of other serum enzymes: Secondary | ICD-10-CM

## 2017-06-11 DIAGNOSIS — R740 Nonspecific elevation of levels of transaminase and lactic acid dehydrogenase [LDH]: Secondary | ICD-10-CM

## 2017-06-11 DIAGNOSIS — K7689 Other specified diseases of liver: Secondary | ICD-10-CM

## 2017-06-19 ENCOUNTER — Ambulatory Visit
Admission: RE | Admit: 2017-06-19 | Discharge: 2017-06-19 | Disposition: A | Discharge status: Home / Self Care | Payer: Medicare Other | Source: Ambulatory Visit | Attending: Nurse Practitioner | Admitting: Nurse Practitioner

## 2017-06-19 DIAGNOSIS — M47896 Other spondylosis, lumbar region: Secondary | ICD-10-CM | POA: Insufficient documentation

## 2017-06-19 DIAGNOSIS — R740 Nonspecific elevation of levels of transaminase and lactic acid dehydrogenase [LDH]: Secondary | ICD-10-CM | POA: Insufficient documentation

## 2017-06-19 DIAGNOSIS — K7689 Other specified diseases of liver: Secondary | ICD-10-CM | POA: Diagnosis not present

## 2017-06-19 DIAGNOSIS — R748 Abnormal levels of other serum enzymes: Secondary | ICD-10-CM | POA: Diagnosis not present

## 2017-06-19 DIAGNOSIS — K449 Diaphragmatic hernia without obstruction or gangrene: Secondary | ICD-10-CM | POA: Diagnosis not present

## 2017-06-19 DIAGNOSIS — J8410 Pulmonary fibrosis, unspecified: Secondary | ICD-10-CM | POA: Diagnosis not present

## 2017-06-19 DIAGNOSIS — I700 Atherosclerosis of aorta: Secondary | ICD-10-CM | POA: Insufficient documentation

## 2017-06-19 DIAGNOSIS — I77811 Abdominal aortic ectasia: Secondary | ICD-10-CM | POA: Insufficient documentation

## 2017-06-19 DIAGNOSIS — R7401 Elevation of levels of liver transaminase levels: Secondary | ICD-10-CM

## 2017-06-19 MED ORDER — IOPAMIDOL (ISOVUE-300) INJECTION 61%
100.0000 mL | Freq: Once | INTRAVENOUS | Status: AC | PRN
  Administered 2017-06-19: 100 mL via INTRAVENOUS

## 2017-08-17 ENCOUNTER — Ambulatory Visit (INDEPENDENT_AMBULATORY_CARE_PROVIDER_SITE_OTHER): Payer: Medicare Other

## 2017-08-17 DIAGNOSIS — Z23 Encounter for immunization: Secondary | ICD-10-CM

## 2017-08-18 ENCOUNTER — Encounter: Payer: Self-pay | Admitting: Internal Medicine

## 2017-08-18 ENCOUNTER — Ambulatory Visit (INDEPENDENT_AMBULATORY_CARE_PROVIDER_SITE_OTHER): Payer: Medicare Other | Admitting: Internal Medicine

## 2017-08-18 VITALS — BP 122/62 | HR 60 | Ht 69.0 in | Wt 159.0 lb

## 2017-08-18 DIAGNOSIS — I442 Atrioventricular block, complete: Secondary | ICD-10-CM | POA: Diagnosis not present

## 2017-08-18 DIAGNOSIS — Z950 Presence of cardiac pacemaker: Secondary | ICD-10-CM

## 2017-08-18 DIAGNOSIS — I480 Paroxysmal atrial fibrillation: Secondary | ICD-10-CM

## 2017-08-18 DIAGNOSIS — R001 Bradycardia, unspecified: Secondary | ICD-10-CM

## 2017-08-18 LAB — CUP PACEART INCLINIC DEVICE CHECK
Battery Remaining Longevity: 128 mo
Battery Voltage: 2.98 V
Brady Statistic RA Percent Paced: 30 %
Brady Statistic RV Percent Paced: 10 %
Date Time Interrogation Session: 20181002143333
Implantable Lead Implant Date: 20141010
Implantable Lead Implant Date: 20141010
Implantable Lead Location: 753859
Implantable Lead Location: 753860
Implantable Pulse Generator Implant Date: 20141010
Lead Channel Impedance Value: 337.5 Ohm
Lead Channel Impedance Value: 450 Ohm
Lead Channel Pacing Threshold Amplitude: 0.75 V
Lead Channel Pacing Threshold Amplitude: 1.25 V
Lead Channel Pacing Threshold Pulse Width: 0.6 ms
Lead Channel Pacing Threshold Pulse Width: 0.6 ms
Lead Channel Sensing Intrinsic Amplitude: 2.3 mV
Lead Channel Sensing Intrinsic Amplitude: 3.8 mV
Lead Channel Setting Pacing Amplitude: 2 V
Lead Channel Setting Pacing Amplitude: 2.5 V
Lead Channel Setting Pacing Pulse Width: 0.6 ms
Lead Channel Setting Sensing Sensitivity: 1 mV
Pulse Gen Model: 2240
Pulse Gen Serial Number: 2980518

## 2017-08-18 NOTE — Progress Notes (Addendum)
Patient Care Team: Gabriel Cirri, NP as PCP - General (Nurse Practitioner) Antonieta Iba, MD as Consulting Physician (Cardiology)   HPI  Walter Townsend is a 83 y.o. male Seen following pacemaker implantation 10/14 for symptomatic high-grade heart block . Echo at that time demonstrated normal left ventricular function.   He also has had atrial fibrillation for which we tried apixoban but not tolerated 2/2 GI effects.  Rivaroxaban initiated and tolerated;  No bleeding    He has been diagnosed with severe bilateral pulmonary fibrosis and is now on 24 7 oxygen. This is improved his exercise tolerance/dyspnea considerably.  No edema.   Records reviewed   Myoview  Low risk  Thromboembolic risk profile notable for age-86, hypertension-1 for a CHADS-VASc score of 3   7/17 Cr 1.0 (Care Everywhere) K 4.4 8/18   Past Medical History:  Diagnosis Date  . Cardiac pacemaker st Judes   . Hyperlipidemia   . Hypertension   . Orthostatic hypotension 08/22/2014  . PAF (paroxysmal atrial fibrillation) (HCC)   . Prostate enlargement   . Pulmonary fibrosis (HCC)     Past Surgical History:  Procedure Laterality Date  . BACK SURGERY    . FOOT SURGERY    . HERNIA REPAIR    . INSERT / REPLACE / REMOVE PACEMAKER    . PACEMAKER INSERTION  08/26/2013   St. Jude Assurity DDD pacemaker  . PERMANENT PACEMAKER INSERTION N/A 08/26/2013   Procedure: PERMANENT PACEMAKER INSERTION;  Surgeon: Marinus Maw, MD;  Location: [facility] C[facility] LAB;  Service: Cardiovascular;  Laterality: N/A;  . TEMPORARY PACEMAKER INSERTION N/A 08/25/2013   Procedure: TEMPORARY PACEMAKER INSERTION;  Surgeon: Lesleigh Noe, MD;  Location: [facility] CATH LAB;  Service: Cardiovascular;  Laterality: N/A;    Current Outpatient Prescriptions  Medication Sig Dispense Refill  . atorvastatin (LIPITOR) 20 MG tablet Take 1 tablet (20 mg total) by mouth daily. 90 tablet 3  . Calcium Carbonate-Vitamin D (CALCIUM-VITAMIN D) 600-200  MG-UNIT CAPS Take 1 capsule by mouth daily.      . cyanocobalamin 100 MCG tablet Take 100 mcg by mouth daily.    Marland Kitchen desmopressin (DDAVP) 0.1 MG tablet Take 1 tablet (0.1 mg total) by mouth daily. 90 tablet 1  . diltiazem (CARDIZEM CD) 120 MG 24 hr capsule Take 1 capsule (120 mg total) by mouth daily. 90 capsule 3  . docusate sodium (COLACE) 100 MG capsule Take 200 mg by mouth at bedtime.     . finasteride (PROSCAR) 5 MG tablet Take 1 tablet (5 mg total) by mouth daily.    . lansoprazole (PREVACID) 30 MG capsule Take 1 capsule (30 mg total) by mouth daily. 90 capsule 3  . lisinopril (ZESTRIL) 2.5 MG tablet Take 1 tablet (2.5 mg total) by mouth daily. (Patient taking differently: Take 2.5 mg by mouth daily as needed. ) 90 tablet 3  . meclizine (ANTIVERT) 25 MG tablet Take 1 tablet (25 mg total) by mouth 3 (three) times daily as needed for dizziness or nausea. 30 tablet 1  . mirtazapine (REMERON) 7.5 MG tablet Take 7.5 mg by mouth at bedtime.    . potassium chloride SA (K-DUR,KLOR-CON) 20 MEQ tablet Take 2 tablets (40 mEq total) by mouth daily. (Patient taking differently: Take 20 mEq by mouth daily. ) 4 tablet 0  . rivaroxaban (XARELTO) 20 MG TABS tablet Take 1 tablet (20 mg total) by mouth daily with supper. 90 tablet 3   No current facility-administered medications  for this visit.     No Known Allergies  Review of Systems negative except from HPI and PMH  Physical Exam BP 122/62 (BP Location: Right Arm, Patient Position: Sitting, Cuff Size: Normal)   Pulse 60   Ht  (1.753 m)   Wt 159 lb (72.1 kg)   BMI 23.48 kg/m  Well developed and nourished in mild resp distress wearing O2  HENT normal Neck supple with JVP-flat Clear Regular rate and rhythm, 2/6 systolic  Abd-soft with active BS No Clubbing cyanosis edema Skin-warm and dry A & Oriented  Grossly normal sensory and motor function  ECG demonstrates sinus rhythm with right bundle branch block and left axis deviation Intervals  17/15/42   Assessment and  Plan  Hypertension     Orthostatic hypotension/postprandial   Complete heart block -intermittent  stable post pacing  Sinus bradycardia  Pacemaker St Jude   The patient's device was interrogated.  The information was reviewed. No changes were made in the programming.     Atrial fibrillation  DOE/ chronic interstitial lung disease  PMT    On Anticoagulation;  No bleeding issues   BP well controlled  Much better  HR excursion reasonable   PMT algorithm working   Although retrograde appears interminttent

## 2017-08-18 NOTE — Patient Instructions (Addendum)

## 2017-09-01 ENCOUNTER — Ambulatory Visit (INDEPENDENT_AMBULATORY_CARE_PROVIDER_SITE_OTHER): Payer: Medicare Other | Admitting: *Deleted

## 2017-09-01 DIAGNOSIS — I442 Atrioventricular block, complete: Secondary | ICD-10-CM

## 2017-09-01 NOTE — Progress Notes (Signed)
Remote pacemaker transmission.   

## 2017-09-04 ENCOUNTER — Encounter: Payer: Self-pay | Admitting: Cardiology

## 2017-09-15 ENCOUNTER — Encounter: Payer: Medicare Other | Admitting: Internal Medicine

## 2017-09-18 ENCOUNTER — Telehealth: Payer: Self-pay | Admitting: Cardiovascular Disease

## 2017-09-18 NOTE — Telephone Encounter (Signed)
Spoke with patient and reviewed Dr. Windell HummingbirdGollan's recommendations. Advised him to increase fluids and discussed medication timing. He was appreciative for the call back and had no further questions or concerns at this time.

## 2017-09-18 NOTE — Telephone Encounter (Signed)
Pt c/o Shortness Of Breath: STAT if SOB developed within the last 24 hours or pt is noticeably SOB on the phone  1. Are you currently SOB (can you hear that pt is SOB on the phone)? yes  2. How long have you been experiencing SOB? Pt is on oxygen, but his SOB has gotten worse  3. Are you SOB when sitting or when up moving around? Moving around . Oxygen level is 95-96  4. Are you currently experiencing any other symptoms? Also chest  Pt c/o of Chest Pain: STAT if CP now or developed within 24 hours  1. Are you having CP right now? no  2. Are you experiencing any other symptoms (ex. SOB, nausea, vomiting, sweating)? Sob, nauseated  3. How long have you been experiencing CP? For a while  4. Is your CP continuous or coming and going? Comes and goes  5. Have you taken Nitroglycerin? no ?

## 2017-09-18 NOTE — Telephone Encounter (Signed)
Low blood pressure in the morning is chronic issue He has high urine output overnight, likely gets dehydrated in the morning Would push heavy fluids first thing in the morning Consider taking diltiazem later in the day Can also decrease diltiazem down to 30 mg 3 times a day or twice a day if needed for low blood pressure

## 2017-09-18 NOTE — Telephone Encounter (Signed)
Patient states that his blood pressures have been low in the AM and sometimes it is 80/50 and he is just not able to get up and go. He reports that he will sit down and drink gatorade and that helps it to come back up. He states that he has not been feeling well with no appetite and some weight loss. He reports shortness of breath which is not new and his oxygen sats have been normal with home oxygen. He does report having some "flash of chest pain that hits quick and leaves quick and it does not last" when he lays down at night. He denies any other symptoms and states that he is just not feeling well. Reviewed fall precautions when changing positions and how BP can fluctuate based on changes in position. Patient also reports loss of appetite and weight loss. Discussed with him to follow up with PCP as well. He scheduled follow up with Dr. Mariah MillingGollan in January and let him know that we would add him to the wait list if something should come open before then. Advised him to please call back if blood pressures persistently stay lower than 90/60 and to continue monitoring. He was appreciative for the call back and had no further questions at this time.

## 2017-09-18 NOTE — Telephone Encounter (Signed)
Pt states his BP has been low.  This morning was 102/50 Yesterday 108/60 States it comes back in the 120s/60 at night.

## 2017-09-21 LAB — CUP PACEART REMOTE DEVICE CHECK
Battery Remaining Percentage: 95.5 %
Battery Voltage: 2.98 V
Brady Statistic AP VS Percent: 1 %
Brady Statistic RA Percent Paced: 35 %
Brady Statistic RV Percent Paced: 99 %
Implantable Lead Implant Date: 20141010
Implantable Pulse Generator Implant Date: 20141010
Lead Channel Pacing Threshold Amplitude: 0.75 V
Lead Channel Pacing Threshold Pulse Width: 0.6 ms
Lead Channel Sensing Intrinsic Amplitude: 4.2 mV
Lead Channel Setting Pacing Amplitude: 2.5 V
MDC IDC LEAD IMPLANT DT: 20141010
MDC IDC LEAD LOCATION: 753859
MDC IDC LEAD LOCATION: 753860
MDC IDC MSMT BATTERY REMAINING LONGEVITY: 92 mo
MDC IDC MSMT LEADCHNL RA IMPEDANCE VALUE: 450 Ohm
MDC IDC MSMT LEADCHNL RA PACING THRESHOLD PULSEWIDTH: 0.6 ms
MDC IDC MSMT LEADCHNL RV IMPEDANCE VALUE: 350 Ohm
MDC IDC MSMT LEADCHNL RV PACING THRESHOLD AMPLITUDE: 1.25 V
MDC IDC MSMT LEADCHNL RV SENSING INTR AMPL: 2.1 mV
MDC IDC SESS DTM: 20181016060014
MDC IDC SET LEADCHNL RA PACING AMPLITUDE: 2 V
MDC IDC SET LEADCHNL RV PACING PULSEWIDTH: 0.6 ms
MDC IDC SET LEADCHNL RV SENSING SENSITIVITY: 1 mV
MDC IDC STAT BRADY AP VP PERCENT: 35 %
MDC IDC STAT BRADY AS VP PERCENT: 65 %
MDC IDC STAT BRADY AS VS PERCENT: 1 %
Pulse Gen Serial Number: 2980518

## 2017-10-06 ENCOUNTER — Other Ambulatory Visit: Payer: Self-pay | Admitting: Cardiovascular Disease

## 2017-10-06 NOTE — Telephone Encounter (Signed)
Please advise if ok to refill Lansoprazole.     Refill Request Xarelto.

## 2017-11-18 ENCOUNTER — Telehealth: Payer: Self-pay | Admitting: Pulmonary Disease

## 2017-11-18 DIAGNOSIS — J84112 Idiopathic pulmonary fibrosis: Secondary | ICD-10-CM

## 2017-11-18 NOTE — Telephone Encounter (Signed)
CXR ordered and pt informed to get 3-4 days prior to appt. Nothing further needed.

## 2017-11-18 NOTE — Telephone Encounter (Signed)
Patient has appt with Dr. Sung AmabileSimonds on 1/21 Patient was told at last appt to have chest xray completed before f/u visit There is no order in Please call to discuss

## 2017-11-21 NOTE — Progress Notes (Signed)
Cardiology Office Note  Date:  11/23/2017   ID:  Walter Townsend, DOB 08/07/1925, MRN [id number]  PCP:  Walter Townsend, Walter B III, MD   Chief Complaint  Patient presents with  . other    C/o chest pain. Meds reviewed verbally with pt.    HPI:  84 yo gentleman with a h/o of  Anxiety tachypalpitations,  status post pacemaker for AV block followed by Dr. Graciela HusbandsKlein,  hyperlipidemia,  GERD, esophageal stricture,  pulmonary fibrosis with worsening SOB over the past several years,   Profound nocturia leading to orthostasis, previously on desmopressin presenting for followup for his labile hypertension/orthostasis .  Previous stress test November 2017 No ischemia   CT scan chest July 2018 Atherosclerotic calcifications aorta, proximal great vessels and coronary arteries. Ascending thoracic aorta upper normal caliber 3.8 cm diameter.  Now on oxygen Sometimes takes it off at rest Has a generator and portable  "Gives out" with exertion 95% on oxygen with exertion Chronic cough, better with hot drinks  Drinking gatorade as needed for low BP Takes water next to bed  He does have prostate issues, improved symptoms with finasteride  Has not had any leg swelling, abdominal bloating Pulmonary fibrosis diagnosis dates back to 2012 Followed by pulmonary   Currently tolerating xarelto.  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 He became exhausted, shortness of breath, could barely make it  back into his house. He checked his blood pressure which was 95/55, heart rate 90. His knee is giving him problems, pain. Sees orthopedics for shot [name](Supartz)  He only smoked for 4 years as a teenager but did have significant secondhand smoke. Previously seen by pulmonary for lung dz  PMH:   has a past medical history of Cardiac pacemaker st Judes, Hyperlipidemia, Hypertension, Orthostatic hypotension (08/22/2014), PAF (paroxysmal atrial fibrillation) (HCC), Prostate enlargement, and Pulmonary fibrosis (HCC).  PSH:    Past Surgical History:  Procedure Laterality Date  . BACK SURGERY    . FOOT SURGERY    . HERNIA REPAIR    . INSERT / REPLACE / REMOVE PACEMAKER    . PACEMAKER INSERTION  08/26/2013   St. Jude Assurity DDD pacemaker  . PERMANENT PACEMAKER INSERTION N/A 08/26/2013   Procedure: PERMANENT PACEMAKER INSERTION;  Surgeon: Marinus MawGregg W Taylor, MD;  [facility] LAB;  Service: Cardiovascular;  Laterality: N/A;  . TEMPORARY PACEMAKER INSERTION N/A 08/25/2013   Procedure: TEMPORARY PACEMAKER INSERTION;  Surgeon: Lesleigh NoeHenry W Smith III, MD;  [facility];  Service: Cardiovascular;  Laterality: N/A;    Current Outpatient Medications  Medication Sig Dispense Refill  . atorvastatin (LIPITOR) 20 MG tablet TAKE 1 TABLET DAILY 90 tablet 3  . Calcium Carbonate-Vitamin D (CALCIUM-VITAMIN D) 600-200 MG-UNIT CAPS Take 1 capsule by mouth daily.      . cyanocobalamin 100 MCG tablet Take 100 mcg by mouth daily.    Marland Kitchen. diltiazem (CARDIZEM CD) 120 MG 24 hr capsule Take 1 capsule (120 mg total) by mouth daily. 90 capsule 3  . docusate  sodium (COLACE) 100 MG capsule Take 200 mg by mouth at bedtime.     . finasteride (PROSCAR) 5 MG tablet Take 1 tablet (5 mg total) by mouth daily.    . lansoprazole (PREVACID) 30 MG capsule TAKE 1 CAPSULE DAILY 90 capsule 3  . lisinopril (ZESTRIL) 2.5 MG tablet Take 1 tablet (2.5 mg total) by mouth daily. (Patient taking differently: Take 2.5 mg by mouth daily as  needed. ) 90 tablet 3  . meclizine (ANTIVERT) 25 MG tablet Take 1 tablet (25 mg total) by mouth 3 (three) times daily as needed for dizziness or nausea. 30 tablet 1  . potassium chloride SA (K-DUR,KLOR-CON) 20 MEQ tablet Take 2 tablets (40 mEq total) by mouth daily. (Patient taking differently: Take 20 mEq by mouth daily. ) 4 tablet 0  . XARELTO 20 MG TABS tablet TAKE 1 TABLET DAILY WITH   SUPPER 90 tablet 3   No current facility-administered medications for this visit.      Allergies:   Patient has no known allergies.   Social History:  The patient  reports that he quit smoking about 69 years ago. His smoking use included cigarettes. He has a 0.90 pack-year smoking history. he has never used smokeless tobacco. He reports that he does not drink alcohol or use drugs.   Family History:   family history includes Cancer in his brother; Coronary artery disease in his other; Heart attack in his brother, brother, brother, brother, and sister; Heart disease in his father and other; Heart disease (age of onset: 56) in his son; Heart failure in his brother.    Review of Systems: Review of Systems  Respiratory: Positive for shortness of breath.   Cardiovascular: Negative.   Gastrointestinal: Negative.   Musculoskeletal: Negative.   Neurological: Positive for weakness.  Psychiatric/Behavioral: Negative.   All other systems reviewed and are negative.    PHYSICAL EXAM: VS:  BP 126/64 (BP Location: Left Arm, Patient Position: Sitting, Cuff Size: Normal)   Pulse 62   Ht 5\' 9"  (1.753 m)   Wt 160 lb 8 oz (72.8 kg)   BMI 23.70 kg/m  , BMI Body mass index is 23.7 kg/m. GEN: Well nourished, well developed, in no acute distress , on nasal cannula oxygen HEENT: normal  Neck: no JVD, carotid bruits, or masses Cardiac: RRR; no murmurs, rubs, or gallops,no edema  Respiratory:  Crackles at the bases bilaterally halfway up, normal work of breathing GI: soft, nontender, nondistended, + BS MS: no  deformity or atrophy  Skin: warm and dry, no rash Neuro:  Strength and sensation are intact Psych: euthymic mood, full affect    Recent Labs: 02/09/2017: ALT 34 06/02/2017: B Natriuretic Peptide 754.0 06/03/2017: BUN 16; Creatinine, Ser 0.94; Hemoglobin 12.2; Magnesium 1.9; Platelets 251; Potassium 3.1; Sodium 136    Lipid Panel No results found for: CHOL, HDL, LDLCALC, TRIG    Wt Readings from Last 3 Encounters:  11/23/17 160 lb 8 oz (72.8 kg)  08/18/17 159 lb (72.1 kg)  06/03/17 167 lb 11.2 oz (76.1 kg)       ASSESSMENT AND PLAN: Mixed hyperlipidemia - Plan: EKG 12-Lead Tolerating Lipitor 20 mg daily  Essential hypertension - Plan: EKG 12-Lead Tolerating diltiazem 120 mg daily Suggested he continue high fluid intake in the mornings after excessive urination overnight  SVT/ PSVT/ PAT - Plan: EKG 12-Lead On diltiazem for rhythm control Denies having any recent episodes of tachycardia  Chest pain at rest - Plan: EKG 12-Lead No recent episodes  of chest pain at rest, no further workup at this time  Orthostatic hypotension Likely from significant nocturia Recommended he talk with primary care about restarting desmopressin before bed  Pulmonary fibrosis (HCC) On nasal cannula oxygen Followed by pulmonary Slow progressive disease over the past 6 years  AV block, complete-intermittent -   Pacemaker, followed by Dr. Andris Flurry  Paroxysmal atrial fibrillation Previously with mode switches noted on pacer download  on anticoagulation, tolerating xarelto.   Excess urination at night Previously on desmopressin before bed 0.1 mg Having inappropriate diuresis at night causing hypotension in the morning Mild improvement in symptoms by drinking water throughout the night  Orthostasis Reports symptoms are tolerable if he drinks fluids through the night  SOB (shortness of breath) -  Likely secondary to underlying fibrosis Managed by pulmonary  Chest pain, unspecified  chest pain type -  Previous negative stress test Chest pain atypical in nature today No further workup at this time  Pulmonary fibrosis (HCC) Followed by pulmonary, slow progressive symptoms, worsening shortness of breath on exertion   Anxiety Long history of stress/anxiety Stable   Total encounter time more than 25 minutes  Greater than 50% was spent in counseling and coordination of care with the patient  Disposition:   F/U  6 months   No orders of the defined types were placed in this encounter.    Signed, Dossie Arbour, M.D., Ph.D. 11/23/2017  [facility] [place], [place] [phone]

## 2017-11-23 ENCOUNTER — Ambulatory Visit (INDEPENDENT_AMBULATORY_CARE_PROVIDER_SITE_OTHER): Payer: Medicare Other | Admitting: Cardiovascular Disease

## 2017-11-23 VITALS — BP 126/64 | HR 62 | Ht 69.0 in | Wt 160.5 lb

## 2017-11-23 DIAGNOSIS — I951 Orthostatic hypotension: Secondary | ICD-10-CM | POA: Diagnosis not present

## 2017-11-23 DIAGNOSIS — I480 Paroxysmal atrial fibrillation: Secondary | ICD-10-CM

## 2017-11-23 DIAGNOSIS — J8410 Pulmonary fibrosis, unspecified: Secondary | ICD-10-CM | POA: Diagnosis not present

## 2017-11-23 DIAGNOSIS — E782 Mixed hyperlipidemia: Secondary | ICD-10-CM | POA: Diagnosis not present

## 2017-11-23 DIAGNOSIS — I471 Supraventricular tachycardia: Secondary | ICD-10-CM | POA: Diagnosis not present

## 2017-11-23 NOTE — Patient Instructions (Addendum)

## 2017-11-26 NOTE — Addendum Note (Signed)
Addended by: Kendrick FriesLOPEZ, MARINA C on: 11/26/2017 09:38 AM   Modules accepted: Orders

## 2017-12-01 ENCOUNTER — Ambulatory Visit (INDEPENDENT_AMBULATORY_CARE_PROVIDER_SITE_OTHER): Payer: Medicare Other | Admitting: *Deleted

## 2017-12-01 ENCOUNTER — Ambulatory Visit
Admission: RE | Admit: 2017-12-01 | Discharge: 2017-12-01 | Disposition: A | Discharge status: Home / Self Care | Payer: Medicare Other | Source: Ambulatory Visit | Attending: Pulmonary Disease | Admitting: Pulmonary Disease

## 2017-12-01 DIAGNOSIS — J84112 Idiopathic pulmonary fibrosis: Secondary | ICD-10-CM | POA: Insufficient documentation

## 2017-12-01 DIAGNOSIS — I700 Atherosclerosis of aorta: Secondary | ICD-10-CM | POA: Diagnosis not present

## 2017-12-01 DIAGNOSIS — I442 Atrioventricular block, complete: Secondary | ICD-10-CM | POA: Diagnosis not present

## 2017-12-02 NOTE — Progress Notes (Signed)
Remote pacemaker transmission.   

## 2017-12-04 ENCOUNTER — Encounter: Payer: Self-pay | Admitting: Cardiology

## 2017-12-04 LAB — CUP PACEART REMOTE DEVICE CHECK
Battery Remaining Longevity: 86 mo
Battery Remaining Percentage: 95.5 %
Battery Voltage: 2.96 V
Brady Statistic RA Percent Paced: 32 %
Date Time Interrogation Session: 20190115070015
Implantable Lead Location: 753859
Implantable Lead Location: 753860
Lead Channel Impedance Value: 450 Ohm
Lead Channel Pacing Threshold Pulse Width: 0.6 ms
Lead Channel Sensing Intrinsic Amplitude: 3.1 mV
Lead Channel Sensing Intrinsic Amplitude: 5.2 mV
Lead Channel Setting Pacing Amplitude: 2 V
MDC IDC LEAD IMPLANT DT: 20141010
MDC IDC LEAD IMPLANT DT: 20141010
MDC IDC MSMT LEADCHNL RA PACING THRESHOLD AMPLITUDE: 0.75 V
MDC IDC MSMT LEADCHNL RV IMPEDANCE VALUE: 330 Ohm
MDC IDC MSMT LEADCHNL RV PACING THRESHOLD AMPLITUDE: 1.25 V
MDC IDC MSMT LEADCHNL RV PACING THRESHOLD PULSEWIDTH: 0.6 ms
MDC IDC PG IMPLANT DT: 20141010
MDC IDC SET LEADCHNL RV PACING AMPLITUDE: 2.5 V
MDC IDC SET LEADCHNL RV PACING PULSEWIDTH: 0.6 ms
MDC IDC SET LEADCHNL RV SENSING SENSITIVITY: 1 mV
MDC IDC STAT BRADY AP VP PERCENT: 33 %
MDC IDC STAT BRADY AP VS PERCENT: 1 %
MDC IDC STAT BRADY AS VP PERCENT: 66 %
MDC IDC STAT BRADY AS VS PERCENT: 1 %
MDC IDC STAT BRADY RV PERCENT PACED: 99 %
Pulse Gen Model: 2240
Pulse Gen Serial Number: 2980518

## 2017-12-07 ENCOUNTER — Ambulatory Visit (INDEPENDENT_AMBULATORY_CARE_PROVIDER_SITE_OTHER): Payer: Medicare Other | Admitting: Pulmonary Disease

## 2017-12-07 ENCOUNTER — Encounter: Payer: Self-pay | Admitting: Pulmonary Disease

## 2017-12-07 VITALS — BP 148/68 | HR 76 | Ht 69.0 in | Wt 162.0 lb

## 2017-12-07 DIAGNOSIS — J209 Acute bronchitis, unspecified: Secondary | ICD-10-CM

## 2017-12-07 DIAGNOSIS — J208 Acute bronchitis due to other specified organisms: Secondary | ICD-10-CM | POA: Diagnosis not present

## 2017-12-07 DIAGNOSIS — J8410 Pulmonary fibrosis, unspecified: Secondary | ICD-10-CM

## 2017-12-07 DIAGNOSIS — N402 Nodular prostate without lower urinary tract symptoms: Secondary | ICD-10-CM | POA: Insufficient documentation

## 2017-12-07 DIAGNOSIS — Z8719 Personal history of other diseases of the digestive system: Secondary | ICD-10-CM | POA: Insufficient documentation

## 2017-12-07 DIAGNOSIS — I499 Cardiac arrhythmia, unspecified: Secondary | ICD-10-CM | POA: Insufficient documentation

## 2017-12-07 DIAGNOSIS — J9611 Chronic respiratory failure with hypoxia: Secondary | ICD-10-CM

## 2017-12-07 MED ORDER — HYDROCOD POLST-CPM POLST ER 10-8 MG/5ML PO SUER: 5.0000 mL | Freq: Every evening | ORAL | 0 refills | Status: AC | PRN

## 2017-12-07 MED ORDER — AZITHROMYCIN 250 MG PO TABS: ORAL_TABLET | ORAL | 0 refills | Status: AC

## 2017-12-07 NOTE — Patient Instructions (Signed)
Continue oxygen therapy is close to 24 hours a day as possible, most importantly with sleep and exertion  Continue nebulized medications as needed for increased cough, wheezing, chest tightness  Z-Pak (azithromycin) prescribed  Tussionex prescribed to be used prior to bedtime as needed for cough

## 2017-12-07 NOTE — Progress Notes (Signed)
PULMONARY OFFICE FOLLOW-UP NOTE  Requesting MD/Service: Mariah Milling Date of initial consultation: 01/15/2017 Reason for consultation: Pulmonary fibrosis  PT PROFILE: 84 y.o. M remote smoker with slowly progressive pulmonary fibrosis and CT chest pattern c/w UIP/IPF. Previously followed by Dr Kendrick Fries  DATA: 10/13/2011 CT chest: Pattern consistent with mild UIP  10/13/11 Barium swallow: Mild presbyesophagus with hiatal hernia and significant gastroesophageal reflux. Single episode of silent aspiration observed 10/17/2011 PFTs: No obstruction, normal TLC and DLCO 06/02/17 CT chest: Progression of pulmonary fibrosis compared to prior study in 2012. No acute infiltrates or edema.  INTERVAL HISTORY: Last seen 06/08/17.  Had increased cough and purulent sputum production earlier this month.  Took 5 days of levofloxacin.  SUBJ:  This is a routine reevaluation.  However, earlier this month he developed increased cough and sputum production which she describes as yellow.  Also, left-sided chest discomfort.  He took some leftover levofloxacin for 5 days from January 5 - January 9.  That helped some but his symptoms have not relapsed and he continues to have cough productive of yellow mucus.  He denies fever, hemoptysis, pleuritic chest pain.  He also reports significant weakness.  He requests a cough suppressant to be used at night as the cough is preventing him from sleeping soundly.  He has used over-the-counter cough suppressants without benefit.   OBJ:  Vitals:   12/07/17 1329 12/07/17 1335  BP:  (!) 148/68  Pulse:  76  SpO2:  98%  Weight: 73.5 kg (162 lb)   Height: 5\' 9"  (1.753 m)   2 LPM pulse [place] O2   EXAM:  Gen: NAD HEENT: WNL Neck: No JVD noted Lungs: Diffuse bilateral crackles, no wheezes Cardiovascular: Regular, no murmurs Abdomen: Soft, nontender, normal BS Ext: 1+ symmetric ankle edema Neuro: No focal deficits  DATA:   BMP Latest Ref Rng & Units 06/03/2017 06/03/2017 06/02/2017   Glucose 65 - 99 mg/dL - 409(W) 119(J)  BUN 6 - 20 mg/dL - 16 47(W)  Creatinine 0.61 - 1.24 mg/dL - 2.95 6.21  BUN/Creat Ratio 10 - 24 - - -  Sodium 135 - 145 mmol/L - 136 126(L)  Potassium 3.5 - 5.1 mmol/L 3.1(L) 2.8(L) 4.0  Chloride 101 - 111 mmol/L - 106 96(L)  CO2 22 - 32 mmol/L - 21(L) 22  Calcium 8.9 - 10.3 mg/dL - 8.5(L) 8.6(L)    CBC Latest Ref Rng & Units 06/03/2017 06/02/2017 02/09/2017  WBC 3.8 - 10.6 K/uL 6.4 16.7(H) 9.7  Hemoglobin 13.0 - 18.0 g/dL 12.2(L) 11.8(L) 13.8  Hematocrit 40.0 - 52.0 % 36.2(L) 34.3(L) 40.2  Platelets 150 - 440 K/uL 251 272 216    CXR (12/01/17):  NSC advanced changes of pulmonary fibrosis  IMPRESSION:   Pulmonary fibrosis - radiographs reveal changes of PF first detected in 2012. CT chest findings are most compatible with IPF. This appears to be a course of slow, inexorable progression. It is unlikely that pirfenidone or nintedanib would make much of a difference in the course Chronic hypoxemic respiratory failure - due to IPF H/O HH, reflux - chronic aspiration might be a contributor to progression of PF Acute purulent bronchitis, recurrent   PLAN:  Continue oxygen therapy as close to 24 hours per day as possible  Cont DuoNeb as needed  Z-Pak prescribed  Tussionex prescribed to be used as needed at bedtime  Follow-up 02/25 at which time, we will see how he is doing with the acute bronchitis.  I will also initiate discussions regarding EOL issues.  Given the advanced nature of his pulmonary fibrosis, he is a poor candidate for intubation and mechanical ventilation.   Billy Fischeravid Jeanenne Licea, MD PCCM service Mobile [phone] Pager [phone] 12/07/2017 2:04 PM

## 2017-12-21 ENCOUNTER — Telehealth: Payer: Self-pay | Admitting: Cardiovascular Disease

## 2017-12-21 DIAGNOSIS — I480 Paroxysmal atrial fibrillation: Secondary | ICD-10-CM

## 2017-12-21 DIAGNOSIS — R0602 Shortness of breath: Secondary | ICD-10-CM

## 2017-12-21 DIAGNOSIS — I442 Atrioventricular block, complete: Secondary | ICD-10-CM

## 2017-12-21 NOTE — Telephone Encounter (Signed)
Please forward to his cardiologist to see if diuresis should be increased vs. Continued monitoring.

## 2017-12-21 NOTE — Telephone Encounter (Signed)
Pt c/o swelling: STAT is pt has developed SOB within 24 hours  1) How much weight have you gained and in what time span? 8 pounds in 8 days   2) If swelling, where is the swelling located? Both feet   3) Are you currently taking a fluid pill? He has it and tried to take it.only made him dizzy  4) Are you currently SOB? A little   5) Do you have a log of your daily weights (if so, list)? 150 now he is 158   6) Have you gained 3 pounds in a day or 5 pounds in a week? He's gained about 8 pounds since starting it   7) Have you traveled recently? No    Pt took the  Z pack that we prescribed and since then he's been gaining weight and having swollen feet.   He states he's noticed how much he is using the restroom he went peeing about half gallon to a quart every night.   Would like some advise on this  Please call back

## 2017-12-21 NOTE — Telephone Encounter (Signed)
Would probably monitor at this time and avoid diuretics He has inappropriate diuresis overnight with orthostatic hypotension worse in the mornings He could wear compression hose if he has leg swelling Avoid excessive salt, eating out, canned products  Would recommend BMP and a BNP Previously with very low potassium

## 2017-12-22 ENCOUNTER — Emergency Department: Payer: Medicare Other

## 2017-12-22 ENCOUNTER — Other Ambulatory Visit: Payer: Self-pay

## 2017-12-22 ENCOUNTER — Encounter: Payer: Self-pay | Admitting: Emergency Medicine

## 2017-12-22 ENCOUNTER — Inpatient Hospital Stay
Admission: EM | Admit: 2017-12-22 | Discharge: 2017-12-27 | DRG: 246 | Disposition: A | Discharge status: Home Health | Payer: Medicare Other | Attending: Internal Medicine | Admitting: Internal Medicine

## 2017-12-22 DIAGNOSIS — J84112 Idiopathic pulmonary fibrosis: Secondary | ICD-10-CM | POA: Diagnosis present

## 2017-12-22 DIAGNOSIS — I361 Nonrheumatic tricuspid (valve) insufficiency: Secondary | ICD-10-CM | POA: Diagnosis not present

## 2017-12-22 DIAGNOSIS — I700 Atherosclerosis of aorta: Secondary | ICD-10-CM | POA: Diagnosis present

## 2017-12-22 DIAGNOSIS — I340 Nonrheumatic mitral (valve) insufficiency: Secondary | ICD-10-CM | POA: Diagnosis not present

## 2017-12-22 DIAGNOSIS — R7989 Other specified abnormal findings of blood chemistry: Secondary | ICD-10-CM

## 2017-12-22 DIAGNOSIS — J849 Interstitial pulmonary disease, unspecified: Secondary | ICD-10-CM | POA: Diagnosis not present

## 2017-12-22 DIAGNOSIS — J9621 Acute and chronic respiratory failure with hypoxia: Secondary | ICD-10-CM | POA: Diagnosis not present

## 2017-12-22 DIAGNOSIS — Z8249 Family history of ischemic heart disease and other diseases of the circulatory system: Secondary | ICD-10-CM

## 2017-12-22 DIAGNOSIS — R0602 Shortness of breath: Secondary | ICD-10-CM | POA: Diagnosis present

## 2017-12-22 DIAGNOSIS — K219 Gastro-esophageal reflux disease without esophagitis: Secondary | ICD-10-CM | POA: Diagnosis present

## 2017-12-22 DIAGNOSIS — I272 Pulmonary hypertension, unspecified: Secondary | ICD-10-CM | POA: Diagnosis present

## 2017-12-22 DIAGNOSIS — Z91041 Radiographic dye allergy status: Secondary | ICD-10-CM

## 2017-12-22 DIAGNOSIS — I2510 Atherosclerotic heart disease of native coronary artery without angina pectoris: Secondary | ICD-10-CM | POA: Diagnosis present

## 2017-12-22 DIAGNOSIS — Z950 Presence of cardiac pacemaker: Secondary | ICD-10-CM

## 2017-12-22 DIAGNOSIS — R739 Hyperglycemia, unspecified: Secondary | ICD-10-CM | POA: Diagnosis present

## 2017-12-22 DIAGNOSIS — Z7901 Long term (current) use of anticoagulants: Secondary | ICD-10-CM

## 2017-12-22 DIAGNOSIS — Z87891 Personal history of nicotine dependence: Secondary | ICD-10-CM | POA: Diagnosis not present

## 2017-12-22 DIAGNOSIS — I5023 Acute on chronic systolic (congestive) heart failure: Secondary | ICD-10-CM | POA: Diagnosis present

## 2017-12-22 DIAGNOSIS — I480 Paroxysmal atrial fibrillation: Secondary | ICD-10-CM | POA: Diagnosis present

## 2017-12-22 DIAGNOSIS — I214 Non-ST elevation (NSTEMI) myocardial infarction: Secondary | ICD-10-CM | POA: Diagnosis present

## 2017-12-22 DIAGNOSIS — D649 Anemia, unspecified: Secondary | ICD-10-CM | POA: Diagnosis present

## 2017-12-22 DIAGNOSIS — R748 Abnormal levels of other serum enzymes: Secondary | ICD-10-CM | POA: Diagnosis not present

## 2017-12-22 DIAGNOSIS — J8410 Pulmonary fibrosis, unspecified: Secondary | ICD-10-CM | POA: Diagnosis not present

## 2017-12-22 DIAGNOSIS — I110 Hypertensive heart disease with heart failure: Secondary | ICD-10-CM | POA: Diagnosis present

## 2017-12-22 DIAGNOSIS — R609 Edema, unspecified: Secondary | ICD-10-CM

## 2017-12-22 DIAGNOSIS — Z9981 Dependence on supplemental oxygen: Secondary | ICD-10-CM | POA: Diagnosis not present

## 2017-12-22 DIAGNOSIS — N400 Enlarged prostate without lower urinary tract symptoms: Secondary | ICD-10-CM | POA: Diagnosis present

## 2017-12-22 DIAGNOSIS — I255 Ischemic cardiomyopathy: Secondary | ICD-10-CM | POA: Diagnosis present

## 2017-12-22 DIAGNOSIS — Z809 Family history of malignant neoplasm, unspecified: Secondary | ICD-10-CM

## 2017-12-22 DIAGNOSIS — I5021 Acute systolic (congestive) heart failure: Secondary | ICD-10-CM | POA: Diagnosis not present

## 2017-12-22 DIAGNOSIS — E785 Hyperlipidemia, unspecified: Secondary | ICD-10-CM | POA: Diagnosis present

## 2017-12-22 DIAGNOSIS — F419 Anxiety disorder, unspecified: Secondary | ICD-10-CM | POA: Diagnosis present

## 2017-12-22 DIAGNOSIS — E876 Hypokalemia: Secondary | ICD-10-CM | POA: Diagnosis present

## 2017-12-22 DIAGNOSIS — R778 Other specified abnormalities of plasma proteins: Secondary | ICD-10-CM

## 2017-12-22 HISTORY — DX: Other ill-defined heart diseases: I51.89

## 2017-12-22 HISTORY — DX: Supraventricular tachycardia, unspecified: I47.10

## 2017-12-22 HISTORY — DX: Supraventricular tachycardia: I47.1

## 2017-12-22 HISTORY — DX: Personal history of other medical treatment: Z92.89

## 2017-12-22 LAB — BASIC METABOLIC PANEL
Anion gap: 9 (ref 5–15)
BUN: 15 mg/dL (ref 6–20)
CHLORIDE: 99 mmol/L — AB (ref 101–111)
CO2: 24 mmol/L (ref 22–32)
Calcium: 8.5 mg/dL — ABNORMAL LOW (ref 8.9–10.3)
Creatinine, Ser: 0.83 mg/dL (ref 0.61–1.24)
Glucose, Bld: 115 mg/dL — ABNORMAL HIGH (ref 65–99)
Potassium: 4.6 mmol/L (ref 3.5–5.1)
SODIUM: 132 mmol/L — AB (ref 135–145)

## 2017-12-22 LAB — HEPATIC FUNCTION PANEL
ALBUMIN: 2.6 g/dL — AB (ref 3.5–5.0)
ALT: 42 U/L (ref 17–63)
AST: 58 U/L — ABNORMAL HIGH (ref 15–41)
Alkaline Phosphatase: 343 U/L — ABNORMAL HIGH (ref 38–126)
BILIRUBIN INDIRECT: 0.4 mg/dL (ref 0.3–0.9)
Bilirubin, Direct: 0.1 mg/dL (ref 0.1–0.5)
TOTAL PROTEIN: 6.5 g/dL (ref 6.5–8.1)
Total Bilirubin: 0.5 mg/dL (ref 0.3–1.2)

## 2017-12-22 LAB — CBC
HCT: 32 % — ABNORMAL LOW (ref 40.0–52.0)
HEMOGLOBIN: 10.8 g/dL — AB (ref 13.0–18.0)
MCH: 30.6 pg (ref 26.0–34.0)
MCHC: 33.7 g/dL (ref 32.0–36.0)
MCV: 90.8 fL (ref 80.0–100.0)
Platelets: 319 10*3/uL (ref 150–440)
RBC: 3.52 MIL/uL — AB (ref 4.40–5.90)
RDW: 13.9 % (ref 11.5–14.5)
WBC: 12.8 10*3/uL — ABNORMAL HIGH (ref 3.8–10.6)

## 2017-12-22 LAB — LIPASE, BLOOD: LIPASE: 25 U/L (ref 11–51)

## 2017-12-22 LAB — BRAIN NATRIURETIC PEPTIDE: B NATRIURETIC PEPTIDE 5: 2117 pg/mL — AB (ref 0.0–100.0)

## 2017-12-22 LAB — TROPONIN I: TROPONIN I: 0.56 ng/mL — AB (ref <0.03)

## 2017-12-22 MED ORDER — ACETAMINOPHEN 325 MG PO TABS
650.0000 mg | ORAL_TABLET | Freq: Four times a day (QID) | ORAL | Status: DC | PRN
  Administered 2017-12-25: 650 mg via ORAL
  Filled 2017-12-22: qty 2

## 2017-12-22 MED ORDER — HYDROCODONE-ACETAMINOPHEN 5-325 MG PO TABS
1.0000 | ORAL_TABLET | ORAL | Status: DC | PRN
  Administered 2017-12-25: 1 via ORAL

## 2017-12-22 MED ORDER — BISACODYL 5 MG PO TBEC
5.0000 mg | DELAYED_RELEASE_TABLET | Freq: Every day | ORAL | Status: DC | PRN
  Administered 2017-12-24 – 2017-12-27 (×2): 5 mg via ORAL
  Filled 2017-12-22 (×2): qty 1

## 2017-12-22 MED ORDER — FUROSEMIDE 10 MG/ML IJ SOLN
40.0000 mg | Freq: Once | INTRAMUSCULAR | Status: AC
  Administered 2017-12-22: 40 mg via INTRAVENOUS
  Filled 2017-12-22: qty 4

## 2017-12-22 MED ORDER — FINASTERIDE 5 MG PO TABS
5.0000 mg | ORAL_TABLET | Freq: Every day | ORAL | Status: DC
  Administered 2017-12-23 – 2017-12-27 (×4): 5 mg via ORAL
  Filled 2017-12-22 (×4): qty 1

## 2017-12-22 MED ORDER — TRAZODONE HCL 50 MG PO TABS
25.0000 mg | ORAL_TABLET | Freq: Every evening | ORAL | Status: DC | PRN
  Administered 2017-12-24: 25 mg via ORAL
  Filled 2017-12-22: qty 1

## 2017-12-22 MED ORDER — VITAMIN B-12 100 MCG PO TABS
100.0000 ug | ORAL_TABLET | Freq: Every day | ORAL | Status: DC
  Administered 2017-12-24 – 2017-12-27 (×3): 100 ug via ORAL
  Filled 2017-12-22 (×5): qty 1

## 2017-12-22 MED ORDER — RIVAROXABAN 20 MG PO TABS: 20.0000 mg | ORAL_TABLET | Freq: Every day | ORAL | Status: DC

## 2017-12-22 MED ORDER — PANTOPRAZOLE SODIUM 20 MG PO TBEC: 20.0000 mg | DELAYED_RELEASE_TABLET | Freq: Every day | ORAL | Status: DC

## 2017-12-22 MED ORDER — POTASSIUM CHLORIDE CRYS ER 20 MEQ PO TBCR
20.0000 meq | EXTENDED_RELEASE_TABLET | Freq: Every day | ORAL | Status: DC
  Administered 2017-12-23 – 2017-12-26 (×3): 20 meq via ORAL
  Filled 2017-12-22 (×3): qty 1

## 2017-12-22 MED ORDER — PANTOPRAZOLE SODIUM 40 MG PO TBEC
40.0000 mg | DELAYED_RELEASE_TABLET | Freq: Every day | ORAL | Status: DC
  Administered 2017-12-23 – 2017-12-27 (×4): 40 mg via ORAL
  Filled 2017-12-22 (×4): qty 1

## 2017-12-22 MED ORDER — LISINOPRIL 5 MG PO TABS: 2.5000 mg | ORAL_TABLET | Freq: Every day | ORAL | Status: DC | PRN

## 2017-12-22 MED ORDER — ONDANSETRON HCL 4 MG PO TABS: 4.0000 mg | ORAL_TABLET | Freq: Four times a day (QID) | ORAL | Status: DC | PRN

## 2017-12-22 MED ORDER — PNEUMOCOCCAL VAC POLYVALENT 25 MCG/0.5ML IJ INJ: 0.5000 mL | INJECTION | INTRAMUSCULAR | Status: DC

## 2017-12-22 MED ORDER — ACETAMINOPHEN 650 MG RE SUPP: 650.0000 mg | Freq: Four times a day (QID) | RECTAL | Status: DC | PRN

## 2017-12-22 MED ORDER — ONDANSETRON HCL 4 MG/2ML IJ SOLN: 4.0000 mg | Freq: Four times a day (QID) | INTRAMUSCULAR | Status: DC | PRN

## 2017-12-22 MED ORDER — CALCIUM-VITAMIN D 600-200 MG-UNIT PO CAPS: 1.0000 | ORAL_CAPSULE | Freq: Every day | ORAL | Status: DC

## 2017-12-22 MED ORDER — HYDROCOD POLST-CPM POLST ER 10-8 MG/5ML PO SUER
5.0000 mL | Freq: Every evening | ORAL | Status: DC | PRN
Start: 2017-12-22 — End: 2017-12-27
  Administered 2017-12-23: 5 mL via ORAL
  Filled 2017-12-22: qty 5

## 2017-12-22 MED ORDER — ASPIRIN 81 MG PO CHEW
324.0000 mg | CHEWABLE_TABLET | Freq: Once | ORAL | Status: AC
  Administered 2017-12-22: 324 mg via ORAL
  Filled 2017-12-22: qty 4

## 2017-12-22 MED ORDER — DILTIAZEM HCL ER COATED BEADS 120 MG PO CP24
120.0000 mg | ORAL_CAPSULE | Freq: Every day | ORAL | Status: DC
  Administered 2017-12-23: 120 mg via ORAL
  Filled 2017-12-22: qty 1

## 2017-12-22 MED ORDER — DOCUSATE SODIUM 100 MG PO CAPS
100.0000 mg | ORAL_CAPSULE | Freq: Two times a day (BID) | ORAL | Status: DC
  Administered 2017-12-23 – 2017-12-27 (×6): 100 mg via ORAL
  Filled 2017-12-22 (×7): qty 1

## 2017-12-22 MED ORDER — CALCIUM CARBONATE-VITAMIN D 500-200 MG-UNIT PO TABS
1.0000 | ORAL_TABLET | Freq: Every day | ORAL | Status: DC
  Administered 2017-12-24 – 2017-12-26 (×2): 1 via ORAL
  Filled 2017-12-22 (×4): qty 1

## 2017-12-22 MED ORDER — ASPIRIN 81 MG PO CHEW: 324.0000 mg | CHEWABLE_TABLET | Freq: Once | ORAL | Status: DC

## 2017-12-22 MED ORDER — ATORVASTATIN CALCIUM 20 MG PO TABS
20.0000 mg | ORAL_TABLET | Freq: Every day | ORAL | Status: DC
  Administered 2017-12-24 – 2017-12-27 (×3): 20 mg via ORAL
  Filled 2017-12-22 (×4): qty 1

## 2017-12-22 NOTE — ED Notes (Signed)
Pt waiting on admission.   meds given.  family with pt.  Paced rhythm on monitor.

## 2017-12-22 NOTE — ED Notes (Signed)
Report called to yasmin rn [facility] nurse

## 2017-12-22 NOTE — Telephone Encounter (Signed)
Spoke with patient and reviewed Dr. Windell HummingbirdGollan's recommendations. He verbalized understanding and was agreeable with having labs done and instructed that he could [facility] to have those labs when possible. Advised that we would call with results and I would make Pulmonary aware as well. He verbalized understanding of our conversation, agreement with plan, and had no further questions at this time.

## 2017-12-22 NOTE — ED Triage Notes (Signed)
Pt arrived to the ED accompanied by his family for complaints of shortness of breath, leg swelling and weight gain in the past 2 weeks. Pt reports that he has congested heart failure. Pt is AOx4 in no apparent distress.

## 2017-12-22 NOTE — ED Notes (Signed)
Patient is resting comfortably. 

## 2017-12-22 NOTE — ED Notes (Signed)
ED Provider at bedside. 

## 2017-12-22 NOTE — ED Provider Notes (Signed)
[facility] Emergency Department Provider Note  ____________________________________________   I have reviewed the triage vital signs and the nursing notes.   HISTORY  Chief Complaint Shortness of Breath   History limited by: Not Limited   HPI Walter Townsend is a 84 y.o. male who presents to the emergency department today with primary concern for shortness of breath.  This has been present since December.  He states that he has been treated multiple times with antibiotics and cough medication.  However last night his breathing was worse.  He states that his continued been bad today.  It is not positional.  He has had some cough.  He denies any fevers. He has gained 10 pounds. He has had associated lower extremity swelling. In addition to the shortness of breath he also has complaints of stomach upset.  He states that he thinks it is partly due to the antibiotics he has been on.   Per medical record review patient has a history of pacemaker, afib  Past Medical History:  Diagnosis Date  . Cardiac pacemaker [employer]   . Hyperlipidemia   . Hypertension   . Orthostatic hypotension 08/22/2014  . PAF (paroxysmal atrial fibrillation) (HCC)   . Prostate enlargement   . Pulmonary fibrosis [facility])     Patient Active Problem List   Diagnosis Date Noted  . Cardiac arrhythmia 12/07/2017  . History of diverticulosis 12/07/2017  . Hx of gastroesophageal reflux (GERD) 12/07/2017  . Prostate nodule 12/07/2017  . Bronchitis 06/02/2017  . Alkaline phosphatase elevation 12/29/2016  . Hepatic cyst 12/29/2016  . Paroxysmal atrial fibrillation (HCC) 09/22/2016  . Chest pain at rest 02/06/2016  . Bladder outlet obstruction 05/18/2015  . Excessive urination at night 11/21/2014  . History of cardiac arrhythmia 11/20/2014  . S/P placement of cardiac pacemaker 11/20/2014  . Orthostatic hypotension 08/22/2014  . Heat stroke 07/07/2014  . Pacemaker -St. Jude 11/29/2013  .  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 Reflux 10/27/2011  . Cough 10/27/2011  . Pulmonary fibrosis (HCC) 10/06/2011  . SOB (shortness of breath) 09/25/2011  . SVT/ PSVT/ PAT 06/20/2010  . Hyperlipidemia 02/06/2010  . Essential hypertension 02/06/2010    Past Surgical History:  Procedure Laterality Date  . BACK SURGERY    . FOOT SURGERY    . HERNIA REPAIR    . INSERT / REPLACE / REMOVE PACEMAKER    . PACEMAKER INSERTION  08/26/2013   St. Jude Assurity DDD pacemaker  . PERMANENT PACEMAKER INSERTION N/A 08/26/2013   Procedure: PERMANENT PACEMAKER INSERTION;  Surgeon: Marinus Maw, MD;  Location: [facility] CATH LAB;  Service: Cardiovascular;  Laterality: N/A;  . TEMPORARY PACEMAKER INSERTION N/A 08/25/2013   Procedure: TEMPORARY PACEMAKER INSERTION;  Surgeon: Lesleigh Noe, MD;  Location: [facility] CATH LAB;  Service: Cardiovascular;  Laterality: N/A;    Prior to Admission medications   Medication Sig Start Date End Date Taking? Authorizing Provider  atorvastatin (LIPITOR) 20 MG tablet TAKE 1 TABLET DAILY 10/06/17   Antonieta Iba, MD  Calcium Carbonate-Vitamin D (CALCIUM-VITAMIN D) 600-200 MG-UNIT CAPS Take 1 capsule by mouth daily.      [provider]  chlorpheniramine-HYDROcodone (TUSSIONEX PENNKINETIC ER) 10-8 MG/5ML SUER Take 5 mLs by mouth at bedtime as needed for cough. 12/07/17   Merwyn Katos, MD  cyanocobalamin 100 MCG tablet Take 100 mcg by mouth daily.    [provider]  diltiazem (CARDIZEM CD) 120 MG 24 hr capsule  Take 1 capsule (120 mg total) by mouth daily. 04/29/17 12/07/17  Antonieta Iba, MD  docusate sodium (COLACE) 100 MG capsule Take 200 mg by mouth at bedtime.     [provider]  finasteride (PROSCAR) 5 MG tablet Take 1 tablet (5 mg total) by mouth daily. 09/22/16   Antonieta Iba, MD  lansoprazole (PREVACID) 30 MG capsule TAKE 1 CAPSULE DAILY 10/06/17   Antonieta Iba, MD  lisinopril (ZESTRIL) 2.5 MG tablet  Take 1 tablet (2.5 mg total) by mouth daily. Patient taking differently: Take 2.5 mg by mouth daily as needed.  10/01/16   Antonieta Iba, MD  meclizine (ANTIVERT) 25 MG tablet Take 1 tablet (25 mg total) by mouth 3 (three) times daily as needed for dizziness or nausea. 02/09/17   Emily Filbert, MD  potassium chloride SA (K-DUR,KLOR-CON) 20 MEQ tablet Take 2 tablets (40 mEq total) by mouth daily. Patient taking differently: Take 20 mEq by mouth daily.  06/03/17 11/23/17  Enedina Finner, MD  XARELTO 20 MG TABS tablet TAKE 1 TABLET DAILY WITH   SUPPER 10/07/17   Antonieta Iba, MD    Allergies Iodinated diagnostic agents  Family History  Problem Relation Age of Onset  . Heart disease Father   . Heart attack Brother   . Heart attack Sister   . Heart attack Brother   . Heart failure Brother   . Heart attack Brother   . Heart attack Brother   . Cancer Brother        skin  . Heart disease Other   . Coronary artery disease Other   . Heart disease Son 82       stent placement     Social History Social History   Tobacco Use  . Smoking status: Former Smoker    Packs/day: 0.30    Years: 3.00    Pack years: 0.90    Types: Cigarettes    Last attempt to quit: 11/17/1948    Years since quitting: 69.1  . Smokeless tobacco: Never Used  Substance Use Topics  . Alcohol use: No  . Drug use: No    Review of Systems Constitutional: No fever/chills Eyes: No visual changes. ENT: No sore throat. Cardiovascular: Denies chest pain. Respiratory: Positive shortness of breath. Gastrointestinal: Positive for stomach upset, nausea and decreased oral intake. Genitourinary: Negative for dysuria. Musculoskeletal: Positive for leg swelling. Skin: Negative for rash. Neurological: Negative for headaches, focal weakness or numbness.  ____________________________________________   PHYSICAL EXAM:  VITAL SIGNS: ED Triage Vitals  Enc Vitals Group     BP 12/22/17 1907 114/63     Pulse Rate  12/22/17 1907 81     Resp 12/22/17 1907 (!) 26     Temp 12/22/17 1907 98.6 F (37 C)     Temp Source 12/22/17 1907 Oral     SpO2 12/22/17 1903 96 %     Weight 12/22/17 1908 162 lb (73.5 kg)     Height 12/22/17 1908 5\' 8"  (1.727 m)     Head Circumference --      Peak Flow --      Pain Score 12/22/17 1907 4    Constitutional: Alert and oriented. Well appearing and in no distress. Eyes: Conjunctivae are normal.  ENT   Head: Normocephalic and atraumatic.   Nose: No congestion/rhinnorhea.   Mouth/Throat: Mucous membranes are moist.   Neck: No stridor. Hematological/Lymphatic/Immunilogical: No cervical lymphadenopathy. Cardiovascular: Normal rate, regular rhythm.  No murmurs, rubs, or gallops.  Respiratory: Normal respiratory effort without tachypnea nor retractions. Diffuse crackles. Gastrointestinal: Soft and non tender. No rebound. No guarding.  Genitourinary: Deferred Musculoskeletal: Normal range of motion in all extremities. 1+ pitting edema bilaterally Neurologic:  Normal speech and language. No gross focal neurologic deficits are appreciated.  Skin:  Skin is warm, dry and intact. No rash noted. Psychiatric: Mood and affect are normal. Speech and behavior are normal. Patient exhibits appropriate insight and judgment.  ____________________________________________    LABS (pertinent positives/negatives)  CBC wbc 12.8, hgb 10.8, plt 319 Trop 0.56 BMP na 132, glu 115, cr 0.83 BNP 2117.0 Lipase 25 ____________________________________________   EKG  I, Phineas SemenGraydon Frady Taddeo, attending physician, personally viewed and interpreted this EKG  EKG Time: 1930 Rate: 79 Rhythm: ventricular paced rhythm Axis: left axis deviation Intervals: qtc 435 QRS: wide ST changes: no st elevation Impression: abnormal ekg   ____________________________________________    RADIOLOGY  CXR Marked fibrosis  I, Naleyah Ohlinger, personally viewed and evaluated these images  (plain radiographs) as part of my medical decision making. ____________________________________________   PROCEDURES  Procedures  CRITICAL CARE Performed by: Phineas SemenGOODMAN, Brewster Wolters   Total critical care time: 30 minutes  Critical care time was exclusive of separately billable procedures and treating other patients.  Critical care was necessary to treat or prevent imminent or life-threatening deterioration.  Critical care was time spent personally by me on the following activities: development of treatment plan with patient and/or surrogate as well as nursing, discussions with consultants, evaluation of patient's response to treatment, examination of patient, obtaining history from patient or surrogate, ordering and performing treatments and interventions, ordering and review of laboratory studies, ordering and review of radiographic studies, pulse oximetry and re-evaluation of patient's condition.  ____________________________________________   INITIAL IMPRESSION / ASSESSMENT AND PLAN / ED COURSE  Pertinent labs & imaging results that were available during my care of the patient were reviewed by me and considered in my medical decision making (see chart for details).  Patient presented to the emergency department today with primary concerns for shortness of breath.  On exam patient had diffuse crackles as well as bilateral lower extremity swelling.  Differential is broad however I do have concerns for heart failure, could consider cardiac ischemia leading to worsening failure, would consider pneumonias, anemia, electrolyte abnormality amongst other etiologies.  Patient's workup did show a significantly elevated BNP as well as elevated troponin.  Chest x-ray does show pulmonary fibrosis I do wonder if there is a level of edema along with this.  Discussed findings with the patient.  Discussed plans for Lasix and admission.  Patient was also given aspirin here in the emergency  department.   ____________________________________________   FINAL CLINICAL IMPRESSION(S) / ED DIAGNOSES  Final diagnoses:  SOB (shortness of breath)  Elevated troponin  Peripheral edema     Note: This dictation was prepared with Dragon dictation. Any transcriptional errors that result from this process are unintentional     Phineas SemenGoodman, Jonay Hitchcock, MD 12/22/17 2124

## 2017-12-22 NOTE — ED Notes (Signed)
Pt reports sob and swelling of feet and ankles with a recent weith gain of ten pounds.  Pt denies chest pain.  No n/v/d.  Pt is on 2 liters oxygen at home.  Pt reports a non productive cough   No fever.  Pt alert.  Iv started and labs sent.  Family with pt.

## 2017-12-22 NOTE — ED Notes (Signed)
Patient transported to 252 

## 2017-12-23 ENCOUNTER — Other Ambulatory Visit: Payer: Self-pay

## 2017-12-23 ENCOUNTER — Encounter: Payer: Self-pay | Admitting: Nurse Practitioner

## 2017-12-23 ENCOUNTER — Inpatient Hospital Stay (HOSPITAL_COMMUNITY)
Admit: 2017-12-23 | Discharge: 2017-12-23 | Disposition: A | Discharge status: Home / Self Care | Payer: Medicare Other | Attending: Internal Medicine | Admitting: Internal Medicine

## 2017-12-23 DIAGNOSIS — I480 Paroxysmal atrial fibrillation: Secondary | ICD-10-CM

## 2017-12-23 DIAGNOSIS — R748 Abnormal levels of other serum enzymes: Secondary | ICD-10-CM

## 2017-12-23 DIAGNOSIS — I361 Nonrheumatic tricuspid (valve) insufficiency: Secondary | ICD-10-CM

## 2017-12-23 DIAGNOSIS — J8410 Pulmonary fibrosis, unspecified: Secondary | ICD-10-CM

## 2017-12-23 DIAGNOSIS — I5021 Acute systolic (congestive) heart failure: Secondary | ICD-10-CM

## 2017-12-23 DIAGNOSIS — I340 Nonrheumatic mitral (valve) insufficiency: Secondary | ICD-10-CM

## 2017-12-23 DIAGNOSIS — J9621 Acute and chronic respiratory failure with hypoxia: Secondary | ICD-10-CM

## 2017-12-23 LAB — BASIC METABOLIC PANEL
ANION GAP: 10 (ref 5–15)
Anion gap: 9 (ref 5–15)
BUN: 14 mg/dL (ref 6–20)
BUN: 17 mg/dL (ref 6–20)
CALCIUM: 8.3 mg/dL — AB (ref 8.9–10.3)
CO2: 25 mmol/L (ref 22–32)
CO2: 24 mmol/L (ref 22–32)
Calcium: 8.5 mg/dL — ABNORMAL LOW (ref 8.9–10.3)
Chloride: 101 mmol/L (ref 101–111)
Chloride: 100 mmol/L — ABNORMAL LOW (ref 101–111)
Creatinine, Ser: 0.77 mg/dL (ref 0.61–1.24)
Creatinine, Ser: 0.74 mg/dL (ref 0.61–1.24)
GFR calc Af Amer: >60 mL/min (ref >60)
GFR calc Af Amer: >60 mL/min (ref >60)
GLUCOSE: 125 mg/dL — AB (ref 65–99)
Glucose, Bld: 180 mg/dL — ABNORMAL HIGH (ref 65–99)
POTASSIUM: 4.2 mmol/L (ref 3.5–5.1)
Potassium: 3.3 mmol/L — ABNORMAL LOW (ref 3.5–5.1)
SODIUM: 134 mmol/L — AB (ref 135–145)
Sodium: 135 mmol/L (ref 135–145)

## 2017-12-23 LAB — GLUCOSE, CAPILLARY: GLUCOSE-CAPILLARY: 118 mg/dL — AB (ref 65–99)

## 2017-12-23 LAB — HEPARIN LEVEL (UNFRACTIONATED): Heparin Unfractionated: 0.75 IU/mL — ABNORMAL HIGH (ref 0.30–0.70)

## 2017-12-23 LAB — CBC
HCT: 32.2 % — ABNORMAL LOW (ref 40.0–52.0)
HEMOGLOBIN: 10.8 g/dL — AB (ref 13.0–18.0)
MCH: 30 pg (ref 26.0–34.0)
MCHC: 33.6 g/dL (ref 32.0–36.0)
MCV: 89.5 fL (ref 80.0–100.0)
Platelets: 303 10*3/uL (ref 150–440)
RBC: 3.6 MIL/uL — ABNORMAL LOW (ref 4.40–5.90)
RDW: 13.4 % (ref 11.5–14.5)
WBC: 11.7 10*3/uL — ABNORMAL HIGH (ref 3.8–10.6)

## 2017-12-23 LAB — ECHOCARDIOGRAM COMPLETE
Height: 68.5 in
WEIGHTICAEL: 2483.2 [oz_av]

## 2017-12-23 LAB — PROTIME-INR
INR: 1.39
Prothrombin Time: 16.9 seconds — ABNORMAL HIGH (ref 11.4–15.2)

## 2017-12-23 LAB — APTT: aPTT: 38 seconds — ABNORMAL HIGH (ref 24–36)

## 2017-12-23 LAB — TROPONIN I: TROPONIN I: 0.59 ng/mL — AB (ref <0.03)

## 2017-12-23 MED ORDER — RIVAROXABAN 20 MG PO TABS: 20.0000 mg | ORAL_TABLET | Freq: Every day | ORAL | Status: DC

## 2017-12-23 MED ORDER — ENSURE ENLIVE PO LIQD
237.0000 mL | Freq: Three times a day (TID) | ORAL | Status: DC
  Administered 2017-12-23 – 2017-12-27 (×10): 237 mL via ORAL

## 2017-12-23 MED ORDER — BISOPROLOL FUMARATE 5 MG PO TABS
2.5000 mg | ORAL_TABLET | Freq: Every day | ORAL | Status: DC
  Administered 2017-12-24 – 2017-12-26 (×2): 2.5 mg via ORAL
  Filled 2017-12-23 (×4): qty 0.5

## 2017-12-23 MED ORDER — METHYLPREDNISOLONE SODIUM SUCC 40 MG IJ SOLR
40.0000 mg | Freq: Every day | INTRAMUSCULAR | Status: DC
  Administered 2017-12-23 – 2017-12-24 (×2): 40 mg via INTRAVENOUS
  Filled 2017-12-23 (×2): qty 1

## 2017-12-23 MED ORDER — POTASSIUM CHLORIDE CRYS ER 20 MEQ PO TBCR
40.0000 meq | EXTENDED_RELEASE_TABLET | Freq: Once | ORAL | Status: AC
  Administered 2017-12-23: 40 meq via ORAL
  Filled 2017-12-23: qty 2

## 2017-12-23 MED ORDER — FUROSEMIDE 20 MG PO TABS
20.0000 mg | ORAL_TABLET | Freq: Every day | ORAL | Status: DC
  Administered 2017-12-23: 20 mg via ORAL
  Filled 2017-12-23: qty 1

## 2017-12-23 MED ORDER — ASPIRIN EC 81 MG PO TBEC
81.0000 mg | DELAYED_RELEASE_TABLET | Freq: Every day | ORAL | Status: DC
  Administered 2017-12-24 – 2017-12-27 (×4): 81 mg via ORAL
  Filled 2017-12-23 (×4): qty 1

## 2017-12-23 MED ORDER — METOPROLOL TARTRATE 25 MG PO TABS: 25.0000 mg | ORAL_TABLET | Freq: Two times a day (BID) | ORAL | Status: DC

## 2017-12-23 MED ORDER — ADULT MULTIVITAMIN W/MINERALS CH
1.0000 | ORAL_TABLET | Freq: Every day | ORAL | Status: DC
  Administered 2017-12-23 – 2017-12-27 (×4): 1 via ORAL
  Filled 2017-12-23 (×4): qty 1

## 2017-12-23 MED ORDER — ONDANSETRON HCL 4 MG/2ML IJ SOLN
4.0000 mg | Freq: Four times a day (QID) | INTRAMUSCULAR | Status: AC
  Administered 2017-12-23 (×2): 4 mg via INTRAVENOUS
  Filled 2017-12-23 (×3): qty 2

## 2017-12-23 MED ORDER — LISINOPRIL 5 MG PO TABS
2.5000 mg | ORAL_TABLET | Freq: Every day | ORAL | Status: DC
  Administered 2017-12-24 – 2017-12-26 (×2): 2.5 mg via ORAL
  Filled 2017-12-23 (×2): qty 1

## 2017-12-23 MED ORDER — FUROSEMIDE 10 MG/ML IJ SOLN
20.0000 mg | Freq: Every day | INTRAMUSCULAR | Status: DC
  Administered 2017-12-23 – 2017-12-24 (×2): 20 mg via INTRAVENOUS
  Filled 2017-12-23 (×2): qty 2

## 2017-12-23 MED ORDER — HEPARIN (PORCINE) IN NACL 100-0.45 UNIT/ML-% IJ SOLN
1100.0000 [IU]/h | INTRAMUSCULAR | Status: DC
  Administered 2017-12-23: 1000 [IU]/h via INTRAVENOUS
  Administered 2017-12-24: 1100 [IU]/h via INTRAVENOUS
  Filled 2017-12-23 (×2): qty 250

## 2017-12-23 MED ORDER — PROMETHAZINE HCL 25 MG/ML IJ SOLN
12.5000 mg | Freq: Four times a day (QID) | INTRAMUSCULAR | Status: DC | PRN
Start: 2017-12-23 — End: 2017-12-25
  Administered 2017-12-25: 12.5 mg via INTRAVENOUS

## 2017-12-23 NOTE — Care Management Note (Signed)
Case Management Note  Patient Details  Name: Walter Townsend MRN: [id number] Date of Birth: 1925-05-03  Subjective/Objective:                 Admitted with shortness of breath either related to pulmonary fibrosis or CHF. Has chronic home oxygen and nebulizer through [facility]. Outpatient attempts to resolve his sx unsuccessful.  Nstemi with troponins .56 and .59. Consults for cardiology and pulmonary pending.  Chronic Xarelto   Action/Plan:    Expected Discharge Date:                  Expected Discharge Plan:     In-House Referral:     Discharge planning Services     Post Acute Care Choice:    Choice offered to:     DME Arranged:    DME Agency:     HH Arranged:    HH Agency:     Status of Service:     If discussed at [employer] Length of Stay Meetings, dates discussed:    Additional Comments:  Eber HongGreene, Marlaysia Lenig R, RN 12/23/2017, 9:11 AM

## 2017-12-23 NOTE — Progress Notes (Signed)
Initial Nutrition Assessment  DOCUMENTATION CODES:   Not applicable  INTERVENTION:   Recommend check Mg and P labs as pt likely at refeeding risk  Ensure Enlive po TID, each supplement provides 350 kcal and 20 grams of protein  Magic cup TID with meals, each supplement provides 290 kcal and 9 grams of protein  Vital Cuisine daily, each supplement provides 520kcal and 22g of protein.   MVI daily  Liberalize diet  NUTRITION DIAGNOSIS:   Increased nutrient needs related to catabolic illness(Pulmonary fibrosis, CHF) as evidenced by increased estimated needs from protein.  GOAL:   Patient will meet greater than or equal to 90% of their needs  MONITOR:   Supplement acceptance, PO intake, Labs, Weight trends, I & O's, Skin  REASON FOR ASSESSMENT:   Malnutrition Screening Tool    ASSESSMENT:   84 year old male with past medical history significant for pulmonary fibrosis on chronic 2 L home oxygen, paroxysmal atrial fibrillation on anticoagulation, hypertension, hyperlipidemia presents to the hospital secondary to worsening shortness of breath and chest pain   Met with pt in room today. Pt reports poor appetite and oral intake for the past 6-8 weeks r/t nausea and vomiting. Pt reports that he just doesn't have any interest in eating but reports that he has been drinking [employer] and having milkshakes with added protein powder at home. Pt reports that it is easier for him to drink rather than eat because he has difficulty breathing. Per chart, pt has lost 5lbs(3%) over the past month; this is not significant wt loss. Pt would like to have Ensure and vanilla Magic Cups while hospitalized. RD will also add MVI and liberalize the fat restriction from pt's diet as this restricts protein as well. Pt ate about 25% of his lunch today and drank an Ensure. Recommend check Mg and P labs as pt is likely at refeeding risk. Pt being followed by GI as an outpatient for his chronic  nausea and vomiting.   Medications reviewed and include: aspirin, oscal, colace, lasix, solu-medrol, zofran, protonix, KCl, B12  Labs reviewed: K 3.3(L), Ca 8.3(L) BNP 2117(H)- 2/5 Wbc- 11.7(H), Hgb 10.8(L), Hct 32.2(L)  Nutrition-Focused physical exam completed. Findings are no fat depletion, mild muscle depletions over entire body, and moderate edema BLE.   Diet Order:  Diet 2 gram sodium Room service appropriate? Yes; Fluid consistency: Thin  EDUCATION NEEDS:   Education needs have been addressed  Skin:  Reviewed RN Assessment  Last BM:  2/5  Height:   Ht Readings from Last 1 Encounters:  12/23/17 5' 8.5" (1.74 m)    Weight:   Wt Readings from Last 1 Encounters:  12/23/17 155 lb 3.2 oz (70.4 kg)    Ideal Body Weight:  71.36 kg  BMI:  Body mass index is 23.25 kg/m.  Estimated Nutritional Needs:   Kcal:  1700-2000kcal/day   Protein:  91-106g/day   Fluid:  per MD  Koleen Distance MS, RD, LDN Pager #[phone] After Hours Pager: [phone]

## 2017-12-23 NOTE — Progress Notes (Signed)
*  PRELIMINARY RESULTS* Echocardiogram 2D Echocardiogram has been performed.  Walter Townsend [name] 12/23/2017, 9:49 AM

## 2017-12-23 NOTE — Plan of Care (Signed)
Patient tolerating lasix and iv solumedrol , no nausea or vomiting, no pain issues at this time .

## 2017-12-23 NOTE — Consult Note (Signed)
[facility]      Assessment and Plan:  Idiopathic pulmonary fibrosis. -Patient is advanced interstitial lung disease, which is progressive in nature. - At present he does not appear to be suffering from pulmonary fibrosis exacerbation. - Patient will follow-up outpatient with Dr. Sung Townsend in approximately 3 weeks a previously scheduled appointment.  He might benefit from an anti-fibrotic agent to help slow the progression of his underlying disease.  Acute systolic congestive heart failure. -Continue treatment of heart failure as per cardiology recommendations. -Discussed with cardiology, okay to add beta-blocker blockers as tolerated.  Acute on chronic hypoxic respiratory failure. -Multifactorial as above, continue treatment of underlying conditions. --Wean down oxygen as tolerated.    Date: 12/23/2017  MRN# [id number] Walter Townsend 09/05/1925  Referring Physician:   [name] is a 84 y.o. old male seen in consultation for chief complaint of:    Chief Complaint  Patient presents with  . Shortness of Breath    HPI:   The patient is a 84 year old male with a history of idiopathic bony fibrosis, he follows with Dr. Sung Townsend in the pulmonary clinic, he also has a history of atrial fibrillation on Xarelto, heart block status post permanent pacemaker in 2014. He has a baseline cough with yellow sputum.  He recently was noted to have increasing cough, he was given a prescription for Z-Pak.  Subsequently he noted that he was having progressive swelling of his lower extremities and worsening of his baseline dyspnea. Despite the patient's advanced age she does have much better than expected functional status, including driving a car, ambulating around a grocery store and church.  However over the previous few days he had not been able to walk around his house without having significant dyspnea.  He had some Lasix at home which he tried taking without  much improvement, subsequently he was referred to the emergency room, where his BNP was very elevated at 2117, he was diagnosed with acute congestive heart failure. Currently he notes that he is feeling better than he was at the time of presentation, however he continues to have dyspnea.  Has no other complaints at this time.  Echocardiogram performed 12/23/17; EF equals 30%, pulmonary artery systolic pressures 40 mmHg. Images personally reviewed, most recent chest x-ray imaging, as well as CT chest from 2018, and comparison with previous from 2012, shows progressive interstitial lung disease.  PMHX:   Past Medical History:  Diagnosis Date  . Cardiac pacemaker st Jud[employer]   . Diastolic dysfunction    a. 08/2013 Echo: EF 60-65%, no rwma, Gr1 DD, Ao sclerosis w/o stenosis. mildly dil LA/RA, nl RV fxn, PASP 35mmHg.  Marland Kitchen. History of stress test    a. 12/2011 MV: EF 57%, no ischemia->Low risk; b. 09/2016 MV: Ef 55-65%, no ischemia->Low risk.  Marland Kitchen. Hyperlipidemia   . Hypertension   . Orthostatic hypotension 08/22/2014  . PAF (paroxysmal atrial fibrillation) (HCC)    a. CHA2DS2VASc = 4--> Xarelto.  . Prostate enlargement   . PSVT (paroxysmal supraventricular tachycardia) (HCC)   . Pulmonary fibrosis (HCC)    a. On chronic supplemental O2 @ home.   Surgical Hx:  Past Surgical History:  Procedure Laterality Date  . BACK SURGERY    . FOOT SURGERY    . HERNIA REPAIR    . INSERT / REPLACE / REMOVE PACEMAKER    . PACEMAKER INSERTION  08/26/2013   St. Jude Assurity DDD pacemaker  . PERMANENT PACEMAKER INSERTION N/A 08/26/2013   Procedure:  PERMANENT PACEMAKER INSERTION;  Surgeon: Marinus Maw, MD;  Location: [facility] CATH LAB;  Service: Cardiovascular;  Laterality: N/A;  . TEMPORARY PACEMAKER INSERTION N/A 08/25/2013   Procedure: TEMPORARY PACEMAKER INSERTION;  Surgeon: Lesleigh Noe, MD;  Location[facility] C[facility] LAB;  Service: Cardiovascular;  Laterality: N/A;   Family Hx:  Family History  Problem Relation Age  of Onset  . Heart disease Father   . Heart attack Brother   . Heart attack Sister   . Heart attack Brother   . Heart failure Brother   . Heart attack Brother   . Heart attack Brother   . Cancer Brother        skin  . Heart disease Other   . Coronary artery disease Other   . Heart disease Son 58       stent placement    Social Hx:   Social History   Tobacco Use  . Smoking status: Former Smoker    Packs/day: 0.30    Years: 3.00    Pack years: 0.90    Types: Cigarettes    Last attempt to quit: 11/17/1948    Years since quitting: 69.1  . Smokeless tobacco: Never Used  Substance Use Topics  . Alcohol use: No  . Drug use: No   Medication:    Current Facility-Administered Medications:  .  acetaminophen (TYLENOL) tablet 650 mg, 650 mg, Oral, Q6H PRN **OR** acetaminophen (TYLENOL) suppository 650 mg, 650 mg, Rectal, Q6H PRN, Cammy Copa, MD .  aspirin chewable tablet 324 mg, 324 mg, Oral, Once, Cammy Copa, MD .  atorvastatin (LIPITOR) tablet 20 mg, 20 mg, Oral, Daily, Cammy Copa, MD .  bisacodyl (DULCOLAX) EC tablet 5 mg, 5 mg, Oral, Daily PRN, Cammy Copa, MD .  calcium-vitamin D (OSCAL WITH D) 500-200 MG-UNIT per tablet 1 tablet, 1 tablet, Oral, Daily, Cammy Copa, MD .  chlorpheniramine-HYDROcodone (TUSSIONEX) 10-8 MG/5ML suspension 5 mL, 5 mL, Oral, QHS PRN, Cammy Copa, MD .  docusate sodium (COLACE) capsule 100 mg, 100 mg, Oral, BID, Cammy Copa, MD .  feeding supplement (ENSURE ENLIVE) (ENSURE ENLIVE) liquid 237 mL, 237 mL, Oral, TID BM, Enid Baas, MD .  finasteride (PROSCAR) tablet 5 mg, 5 mg, Oral, Daily, Cammy Copa, MD, 5 mg at 12/23/17 1018 .  furosemide (LASIX) tablet 20 mg, 20 mg, Oral, Daily, Enid Baas, MD, 20 mg at 12/23/17 1143 .  HYDROcodone-acetaminophen (NORCO/VICODIN) 5-325 MG per tablet 1-2 tablet, 1-2 tablet, Oral, Q4H PRN, Cammy Copa, MD .  lisinopril (PRINIVIL,ZESTRIL) tablet 2.5 mg, 2.5 mg, Oral, Daily PRN, Cammy Copa, MD .  methylPREDNISolone sodium succinate (SOLU-MEDROL) 40 mg/mL injection 40 mg, 40 mg, Intravenous, Daily, Enid Baas, MD, 40 mg at 12/23/17 1143 .  [START ON 12/24/2017] metoprolol tartrate (LOPRESSOR) tablet 25 mg, 25 mg, Oral, BID, Creig Hines, NP .  multivitamin with minerals tablet 1 tablet, 1 tablet, Oral, Daily, Nemiah Commander, Radhika, MD .  ondansetron [facility]) injection 4 mg, 4 mg, Intravenous, Q6H, Enid Baas, MD, 4 mg at 12/23/17 1143 .  pantoprazole (PROTONIX) EC tablet 40 mg, 40 mg, Oral, Daily, Cammy Copa, MD, 40 mg at 12/23/17 1018 .  pneumococcal 23 valent vaccine (PNU-IMMUNE) injection 0.5 mL, 0.5 mL, Intramuscular, Tomorrow-1000, Cammy Copa, MD .  potassium chloride SA (K-DUR,KLOR-CON) CR tablet 20 mEq, 20 mEq, Oral, Daily, Cammy Copa, MD, 20 mEq at 12/23/17 1018 .  promethazine (PHENERGAN) injection 12.5 mg, 12.5 mg, Intravenous, Q6H PRN, Enid Baas, MD .  traZODone (DESYREL) tablet 25  mg, 25 mg, Oral, QHS PRN, Cammy Copa, MD .  vitamin B-12 (CYANOCOBALAMIN) tablet 100 mcg, 100 mcg, Oral, Daily, Cammy Copa, MD   Allergies:  Iodinated diagnostic agents  Review of Systems: Gen:  Denies  fever, sweats, chills HEENT: Denies blurred vision, double vision. bleeds, sore throat Cvc:  No dizziness, chest pain. Resp:   Denies cough or sputum production, shortness of breath Gi: Denies swallowing difficulty, stomach pain. Gu:  Denies bladder incontinence, burning urine Ext:   No Joint pain, stiffness. Skin: No skin rash,  hives  Endoc:  No polyuria, polydipsia. Psych: No depression, insomnia. Other:  All other systems were reviewed with the patient and were negative other that what is mentioned in the HPI.   Physical Examination:   VS: BP (!) 105/59 (BP Location: Right Arm)   Pulse 76   Temp 97.7 F (36.5 C) (Oral)   Resp 18   Ht 5' 8.5" (1.74 m)   Wt 155 lb 3.2 oz (70.4 kg)   SpO2 94%   BMI 23.25 kg/m   General  Appearance: No distress  Neuro:without focal findings,  speech normal,  HEENT: PERRLA, EOM intact.   Pulmonary: normal breath sounds, No wheezing.  CardiovascularNormal S1,S2.  No m/r/g.   Abdomen: Benign, Soft, non-tender. Renal:  No costovertebral tenderness  GU:  No performed at this time. Endoc: No evident thyromegaly, no signs of acromegaly. Skin:   warm, no rashes, no ecchymosis  Extremities: normal, no cyanosis, clubbing.  Other findings:    LABORATORY PANEL:   CBC Recent Labs  Lab 12/23/17 0435  WBC 11.7*  HGB 10.8*  HCT 32.2*  PLT 303   ------------------------------------------------------------------------------------------------------------------  Chemistries  Recent Labs  Lab 12/22/17 1918 12/23/17 0435  NA  --  135  K  --  3.3*  CL  --  101  CO2  --  25  GLUCOSE  --  125*  BUN  --  14  CREATININE  --  0.77  CALCIUM  --  8.3*  AST 58*  --   ALT 42  --   ALKPHOS 343*  --   BILITOT 0.5  --    ------------------------------------------------------------------------------------------------------------------  Cardiac Enzymes Recent Labs  Lab 12/23/17 0435  TROPONINI 0.59*   ------------------------------------------------------------  RADIOLOGY:  Dg Chest 2 View  Result Date: 12/22/2017 CLINICAL DATA:  84 year old male with shortness of breath. Initial encounter. EXAM: CHEST  2 VIEW COMPARISON:  12/01/2017 and 06/02/2017 chest x-ray. FINDINGS: Marked pulmonary fibrosis similar to prior exam. No new segmental consolidation or pulmonary edema. Cardiomegaly with sequential pacemaker in place. Calcified aorta. No acute osseous abnormality. IMPRESSION: Marked pulmonary fibrosis similar to prior exam. No new segmental consolidation or pulmonary edema. Cardiomegaly with sequential pacemaker in place. Aortic Atherosclerosis (ICD10-I70.0). Electronically Signed   By: Lacy Duverney M.D.   On: 12/22/2017 20:05       Thank  you for the consultation and for  allowing [facility] Critical [facility] to assist in the care of your patient. Our recommendations are noted above.  Please contact us if we can be of further service.   Wells Guiles, MD.  Board Certified in Internal Medicine, Pulmonary Medicine, Critical Care Medicine, and Sleep Medicine.   Pulmonary and Critical Care Office Number: [phone]  Santiago Glad, M.D.  Billy Fischer, M.D  12/23/2017

## 2017-12-23 NOTE — Consult Note (Signed)
Cardiology Consult    Patient ID: Walter Townsend MRN: [id number], DOB/AGE: Dec 30, 1924   Admit date: 12/22/2017 Date of Consult: 12/23/2017  Primary Physician: Rafael Bihari, MD Primary Cardiologist: Julien Nordmann, MD/ EP: Odessa Fleming, MD  Requesting Provider: Henreitta Leber, MD  Patient Profile    Walter Townsend is a 84 y.o. male with a history of pulmonary fibrosis, diast dysfxn, PSVT, PAF on xarelto, HTN, HL, and CHB s/p PPM, who is being seen today for the evaluation of acute CHF at the request of Dr. Nemiah Commander.  Past Medical History   Past Medical History:  Diagnosis Date  . Cardiac pacemaker st Judes   . Diastolic dysfunction    a. 08/2013 Echo: EF 60-65%, no rwma, Gr1 DD, Ao sclerosis w/o stenosis. mildly dil LA/RA, nl RV fxn, PASP .  Marland Kitchen History of stress test    a. 12/2011 MV: EF 57%, no ischemia->Low risk; b. 09/2016 MV: Ef 55-65%, no ischemia->Low risk.  Marland Kitchen Hyperlipidemia   . Hypertension   . Orthostatic hypotension 08/22/2014  . PAF (paroxysmal atrial fibrillation) (HCC)    a. CHA2DS2VASc = 4--> Xarelto.  . Prostate enlargement   . PSVT (paroxysmal supraventricular tachycardia) (HCC)   . Pulmonary fibrosis (HCC)    a. On chronic supplemental O2 @ home.    Past Surgical History:  Procedure Laterality Date  . BACK SURGERY    . FOOT SURGERY    . HERNIA REPAIR    . INSERT / REPLACE / REMOVE PACEMAKER    . PACEMAKER INSERTION  08/26/2013   St. Jude Assurity DDD pacemaker  . PERMANENT PACEMAKER INSERTION N/A 08/26/2013   Procedure: PERMANENT PACEMAKER INSERTION;  Surgeon: Marinus Maw, MD;  Location: [facility] CATH LAB;  Service: Cardiovascular;  Laterality: N/A;  . TEMPORARY PACEMAKER INSERTION N/A 08/25/2013   Procedure: TEMPORARY PACEMAKER INSERTION;  Surgeon: Lesleigh Noe, MD;  Location[facility] C[facility] LAB;  Service: Cardiovascular;  Laterality: N/A;     Allergies  Allergies  Allergen Reactions  . Iodinated Diagnostic Agents Rash    PRE-MED WITH  PREDNISONE    History of Present Illness    84 year old male with a history of remote tobacco abuse and pulmonary fibrosis on chronic supplemental oxygen, complete heart block status post permanent pacemaker in October 2014, PSVT, paroxysmal atrial fibrillation on Xarelto, hypertension, hyperlipidemia, and BPH.  He lives locally by himself but does have help from a girlfriend.  He does use a walker to get around for the most part but still drives and walks some at his church.  He was in his usual state of health until about 6 weeks ago or so when he began to have increasing dyspnea and cough.  He has also been having left upper quadrant discomfort and mild tenderness associated with nausea.  He has had a very poor appetite in that setting and has had some vomiting.  His cough became associated with yellow sputum and he was seen by pulmonology on January 21.  He was prescribed a Z-Pak and Tussionex with plan for follow-up in a month.  He says that within a day of taking his first dose of Zithromax, he began to note lower extremity swelling and increasing dyspnea.  Both symptoms seem to worsen with each additional dose of Zithromax.  He did have some PRN Lasix at home and he took a dose on February 3 and again on February 4 without relief.  He followed up in GI clinic related to his abdominal  discomfort on February 5 and was sent to the emergency department for admission secondary to significant edema and dyspnea.  In the ED, he was found to have mild troponin elevation of 0.56 followed by 0.59.  His BNP was markedly elevated at 2117.  White count was mildly elevated at 12.8.  He was anemic with a hemoglobin of 10.8 and hypokalemic with a potassium of 3.3.  Alkaline phosphatase was also elevated at 343.  His chest x-ray showed pulmonary fibrosis without overt edema.  ECG was paced.  He was given a dose of IV Lasix in the emergency department and then admitted and placed on oral Lasix at 20 mg daily.  Breathing  has improved some since admission.  He was -1.6 L overnight.  He denies any recent history of chest pain.  He is interested in invasive evaluation if felt to be necessary.  Inpatient Medications    . aspirin  324 mg Oral Once  . atorvastatin  20 mg Oral Daily  . calcium-vitamin D  1 tablet Oral Daily  . diltiazem  120 mg Oral Daily  . docusate sodium  100 mg Oral BID  . finasteride  5 mg Oral Daily  . furosemide  20 mg Oral Daily  . methylPREDNISolone (SOLU-MEDROL) injection  40 mg Intravenous Daily  . ondansetron (ZOFRAN) IV  4 mg Intravenous Q6H  . pantoprazole  40 mg Oral Daily  . pneumococcal 23 valent vaccine  0.5 mL Intramuscular Tomorrow-1000  . potassium chloride SA  20 mEq Oral Daily  . rivaroxaban  20 mg Oral Daily  . cyanocobalamin  100 mcg Oral Daily    Family History    Family History  Problem Relation Age of Onset  . Heart disease Father   . Heart attack Brother   . Heart attack Sister   . Heart attack Brother   . Heart failure Brother   . Heart attack Brother   . Heart attack Brother   . Cancer Brother        skin  . Heart disease Other   . Coronary artery disease Other   . Heart disease Son 18       stent placement    indicated that his mother is deceased. He indicated that his father is deceased. He indicated that only one of his two sisters is alive. He indicated that all of his five brothers are deceased. He indicated that his son is alive.   Social History    Social History   Socioeconomic History  . Marital status: Widowed    Spouse name: Not on file  . Number of children: Not on file  . Years of education: Not on file  . Highest education level: Not on file  Social Needs  . Financial resource strain: Not on file  . Food insecurity - worry: Not on file  . Food insecurity - inability: Not on file  . Transportation needs - medical: Not on file  . Transportation needs - non-medical: Not on file  Occupational History  . Not on file  Tobacco  Use  . Smoking status: Former Smoker    Packs/day: 0.30    Years: 3.00    Pack years: 0.90    Types: Cigarettes    Last attempt to quit: 11/17/1948    Years since quitting: 69.1  . Smokeless tobacco: Never Used  Substance and Sexual Activity  . Alcohol use: No  . Drug use: No  . Sexual activity: No  Other Topics Concern  .  Not on file  Social History Narrative   Mr. 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  He as never used pesticides.  His house is not moldy, he has no pets.       Review of Systems    General:  No chills, fever, night sweats or weight changes.  Cardiovascular:  No chest pain, +++ dyspnea on exertion, +++ edema, +++ orthopnea, no palpitations, paroxysmal nocturnal dyspnea. Dermatological: No rash, lesions/masses Respiratory: +++ productive cough w/ yellow sputum, +++ dyspnea Urologic: No hematuria, dysuria Abdominal:   +++ nausea/vomiting, no diarrhea, bright red blood per rectum, melena, or hematemesis Neurologic:  No visual changes, wkns, changes in mental status. All other systems reviewed and are otherwise negative except as noted above.  Physical Exam    Blood pressure (!) 105/59, pulse 76, temperature 97.7 F (36.5 C), temperature source Oral, resp. rate 18, height 5' 8.5" (1.74 m), weight 155 lb 3.2 oz (70.4 kg), SpO2 94 %.  General: Pleasant, NAD Psych: Normal affect. Neuro: Alert and oriented X 3. Moves all extremities spontaneously. HEENT: Normal  Neck: Supple without bruits.  JVP ~ 10cm. Lungs:  Resp regular and unlabored, bilateral crackles throughout. Heart: RRR no s3, s4, 2/6 syst murmur most notable @ apex. Abdomen: Soft, non-tender, non-distended, BS + x 4.  Extremities: No clubbing, cyanosis.  2+ bilat ankle edema.  DP/PT/Radials 2+ and equal bilaterally.  Labs     Recent Labs    12/22/17 1914 12/23/17 0435  TROPONINI 0.56* 0.59*   Lab Results  Component Value Date   WBC 11.7 (H) 12/23/2017   HGB 10.8 (L) 12/23/2017   HCT 32.2 (L) 12/23/2017   MCV 89.5 12/23/2017   PLT 303 12/23/2017    Recent Labs  Lab 12/22/17 1918 12/23/17 0435  NA  --  135  K  --  3.3*  CL  --  101  CO2  --  25  BUN  --  14  CREATININE  --  0.77  CALCIUM  --  8.3*  PROT 6.5  --   BILITOT 0.5  --   ALKPHOS 343*  --   ALT 42  --   AST 58*  --   GLUCOSE  --  125*     Radiology Studies    Dg Chest 2 View  Result Date: 12/22/2017 CLINICAL DATA:  84 year old male with shortness of breath. Initial encounter. EXAM: CHEST  2 VIEW COMPARISON:  12/01/2017 and 06/02/2017 chest x-ray. FINDINGS: Marked pulmonary fibrosis similar to prior exam. No new segmental consolidation or pulmonary edema. Cardiomegaly with sequential pacemaker in place. Calcified aorta. No acute osseous abnormality. IMPRESSION: Marked pulmonary fibrosis similar to prior exam. No new segmental consolidation or pulmonary edema. Cardiomegaly with sequential pacemaker in place. Aortic Atherosclerosis (ICD10-I70.0). Electronically Signed   By: Lacy Duverney M.D.   On: 12/22/2017 20:05   Dg Chest 2 View  Result Date: 12/01/2017 CLINICAL DATA:  Pulmonary fibrosis diagnosed 6 years ago. Yellow productive cough for 1 week. Worsening shortness of breath. Left-sided soreness with coughing. EXAM: CHEST  2 VIEW COMPARISON:  Chest x-ray dated 01/15/2017. Chest CT dated 06/02/2017. FINDINGS: Coarse interstitial markings again noted bilaterally, not appreciably changed, compatible with the previously diagnosed pulmonary fibrosis. No new lung findings. No  confluent opacity to suggest a developing pneumonia. No pleural effusion or pneumothorax seen. Heart size and mediastinal contours are stable. Atherosclerotic changes noted at the aortic arch. Left chest wall  pacemaker/ICD lines appear stable in position. No acute or suspicious osseous finding. IMPRESSION: 1. No active cardiopulmonary disease. No evidence of pneumonia or pulmonary edema. 2. Bilateral pulmonary fibrosis appears stable. 3. Aortic atherosclerosis. Electronically Signed   By: Bary Richard M.D.   On: 12/01/2017 16:09    ECG & Cardiac Imaging    A sensed, V paced, 76.  Assessment & Plan    1.  Acute congestive heart failure: Patient presents with a 6-week history of progressive cough and dyspnea followed by a 2-week history of progressive lower extremity edema with worsening respiratory status.  He was treated with antibiotics as an outpatient which did not improve his symptoms.  He was admitted February 5 with volume overload and responded well to IV Lasix in the emergency department.  Is currently on Lasix 20 mg daily and remains mildly volume overloaded.  He is -1.6 L overnight and his weight is down from 162 pounds on February 5, and 155 pounds this morning.  An echocardiogram has been performed and preliminarily does show mildly depressed LV function.  Continue p.o. Lasix.  Blood pressure is soft at 105/59 and therefore will not add an ACE/ARB at this time.  He is on long-term diltiazem therapy in the setting of paroxysmal atrial fibrillation and paroxysmal supraventricular tachycardia.  With LV dysfunction, this is not ideal.  I have discussed with pulm and a trial of oral  blocker seems reasonable. Would d/c if he develops wheezing.  I will continue diltiazem for now and discuss with Dr. Okey Dupre.  With LV dysfunction and positive troponins, we will need to strongly consider ischemic evaluation as patient, despite his significant respiratory illness, remains fairly independent at home.  2.  Non-STEMI: Patient has not any chest pain but as above, has had progressive dyspnea with volume overload, heart failure, and evidence of LV dysfunction on echo.  Troponin currently peaked at 0.59.  With  follow-up to see if this rises further.  As above, we will strongly consider invasive evaluation and he is agreeable with this plan.  He took a dose of Xarelto on February 5 at 5 PM.  We will plan to start heparin per pharmacy at 5 PM this evening.  We will have to watch H&H closely.  Provided that blood count and renal function remained stable, we may consider pursuit of a diagnostic catheterization on Friday, February 8.  Continue aspirin and reduce dose to 81 mg daily.  Continue statin.  Discussed with pulm  ok to trial him on  blocker.  3.  Paroxysmal atrial fibrillation: Currently in sinus with V pacing.  Holding Xarelto in the setting of above.  Will switch to po lopressor and assess for wheezing.  4.  Essential hypertension: Blood pressure currently soft.  5.  Hyperlipidemia: Continue statin therapy.  6.  Nausea/abdominal discomfort: Patient was seen by GI yesterday but was then sent to the emergency department related to the above.  He will continue to have outpatient workup.  Notably, his alkaline phosphatase is elevated at 343.  Consider abdominal ultrasound.  Defer to medicine.  No change w/ meals, thus doubt mesenteric ischemia.  Continue Zofran as needed.  7.  Normocytic anemia:  H/H down since last July.  No h/o dark stools.  Follow w/ heparin (on xarelto chronically).  8. Pulm fibrosis:  Pulmonary consulted by IM.  ? Need for steroids acutely - would d/c if not felt to be necessary at this time as they are likely to contribute to volume overload.  Signed, Nicolasa Duckinghristopher Janene Yousuf, NP 12/23/2017, 12:38 PM  For questions or updates, please contact   Please consult [email or web address] for contact info under Cardiology/STEMI.

## 2017-12-23 NOTE — Progress Notes (Signed)
[facility] - [place] at [facility]   PATIENT NAME: Walter Townsend    MR#:  [id number]  DATE OF BIRTH:  12/28/1924  SUBJECTIVE:  CHIEF COMPLAINT:   Chief Complaint  Patient presents with  . Shortness of Breath   -Complains of nausea. Still has some shortness of breath. -No coughing, denies any chest pain  REVIEW OF SYSTEMS:  Review of Systems  Constitutional: Positive for malaise/fatigue. Negative for chills and fever.  HENT: Negative for ear discharge, hearing loss and nosebleeds.   Eyes: Negative for blurred vision and double vision.  Respiratory: Positive for shortness of breath. Negative for cough and wheezing.   Cardiovascular: Negative for chest pain, palpitations and leg swelling.  Gastrointestinal: Positive for nausea. Negative for abdominal pain, constipation, diarrhea and vomiting.  Genitourinary: Negative for dysuria and urgency.  Musculoskeletal: Negative for myalgias.  Neurological: Negative for dizziness, speech change, focal weakness, seizures and headaches.  Psychiatric/Behavioral: Negative for depression.    DRUG ALLERGIES:   Allergies  Allergen Reactions  . Iodinated Diagnostic Agents Rash    PRE-MED WITH PREDNISONE    VITALS:  Blood pressure (!) 105/59, pulse 76, temperature 97.7 F (36.5 C), temperature source Oral, resp. rate 18, height 5' 8.5" (1.74 m), weight 70.4 kg (155 lb 3.2 oz), SpO2 94 %.  PHYSICAL EXAMINATION:  Physical Exam  GENERAL:  84 y.o.-year-old elderly patient lying in the bed with no acute distress.  EYES: Pupils equal, round, reactive to light and accommodation. No scleral icterus. Extraocular muscles intact.  HEENT: Head atraumatic, normocephalic. Oropharynx and nasopharynx clear.  NECK:  Supple, no jugular venous distention. No thyroid enlargement, no tenderness.  LUNGS: Normal breath sounds bilaterally, no wheezing, but has bibasilar crackles right >left No use of accessory muscles to  breathe CARDIOVASCULAR: S1, S2 normal. No rubs, or gallops. 3/6 systolic murmur present ABDOMEN: Soft, nontender, nondistended. Bowel sounds present. No organomegaly or mass.  EXTREMITIES: No pedal edema, cyanosis, or clubbing.  NEUROLOGIC: Cranial nerves II through XII are intact. Muscle strength 5/5 in all extremities. Sensation intact. Gait not checked.  PSYCHIATRIC: The patient is alert and oriented x 3.  SKIN: No obvious rash, lesion, or ulcer.    LABORATORY PANEL:   CBC Recent Labs  Lab 12/23/17 0435  WBC 11.7*  HGB 10.8*  HCT 32.2*  PLT 303   ------------------------------------------------------------------------------------------------------------------  Chemistries  Recent Labs  Lab 12/22/17 1918 12/23/17 0435  NA  --  135  K  --  3.3*  CL  --  101  CO2  --  25  GLUCOSE  --  125*  BUN  --  14  CREATININE  --  0.77  CALCIUM  --  8.3*  AST 58*  --   ALT 42  --   ALKPHOS 343*  --   BILITOT 0.5  --    ------------------------------------------------------------------------------------------------------------------  Cardiac Enzymes Recent Labs  Lab 12/23/17 0435  TROPONINI 0.59*   ------------------------------------------------------------------------------------------------------------------  RADIOLOGY:  Dg Chest 2 View  Result Date: 12/22/2017 CLINICAL DATA:  84 year old male with shortness of breath. Initial encounter. EXAM: CHEST  2 VIEW COMPARISON:  12/01/2017 and 06/02/2017 chest x-ray. FINDINGS: Marked pulmonary fibrosis similar to prior exam. No new segmental consolidation or pulmonary edema. Cardiomegaly with sequential pacemaker in place. Calcified aorta. No acute osseous abnormality. IMPRESSION: Marked pulmonary fibrosis similar to prior exam. No new segmental consolidation or pulmonary edema. Cardiomegaly with sequential pacemaker in place. Aortic Atherosclerosis (ICD10-I70.0). Electronically Signed   By: Ernie Hew.D.  On: 12/22/2017 20:05     EKG:   Orders placed or performed during the hospital encounter of 12/22/17  . ED EKG  . ED EKG  . EKG 12-Lead  . EKG 12-Lead  . EKG 12-Lead  . EKG 12-Lead    ASSESSMENT AND PLAN:   84 year old male with past medical history significant for pulmonary fibrosis on chronic 2 L home oxygen, paroxysmal atrial fibrillation on anticoagulation, hypertension, hyperlipidemia presents to the hospital secondary to worsening shortness of breath and chest pain  1. Acute on chronic respiratory failure-secondary to pulmonary fibrosis exacerbation -Also fluid retention, likely diastolic dysfunction. -Received 1 dose of Lasix. Now on 2 L oxygen which uses chronic oxygen -Started on steroids. -Pulmonary consult and cardiology consult requested -Started on oral Lasix. At home he takes when necessary Lasix. -Echocardiogram will be done this admission  2. History of tachycardia palpitations, atrial fibrillation-status post pacemaker for AV block. -On Cardizem for rate control -Has history of chronic atypical chest pain -Continue Xarelto for anticoagulation.  3. Elevated troponin-likely demand ischemia from respiratory distress on presentation. -Cardiology consulted. -Recycle troponins and monitor on telemetry.  4. Hypokalemia-being replaced  5. DVT prophylaxis-on Xarelto  Patient ambulates with a walker at baseline. Encourage ambulation     All the records are reviewed and case discussed with Care Management/Social Workerr. Management plans discussed with the patient, family and they are in agreement.  CODE STATUS: Full Code  TOTAL TIME TAKING CARE OF THIS PATIENT: 37 minutes.   POSSIBLE D/C IN  1-2 DAYS, DEPENDING ON CLINICAL CONDITION.   Arliss Frisina M.D on 12/23/2017 at 11:01 AM  Between 7am to 6pm - Pager - [phone]  After 6pm go to [email or web address] - password [email or web address]  [facility] [place] [facility]  Office  [phone]  CC: Primary care physician; Rafael BihariWalker, John B  III, MD

## 2017-12-23 NOTE — H&P (Signed)
[facility] - [place] at [facility]   PATIENT NAME: Walter Townsend    MR#:  [id number]  DATE OF BIRTH:  1925/07/04  DATE OF ADMISSION:  12/22/2017  PRIMARY CARE PHYSICIAN: Rafael Bihari, MD   REQUESTING/REFERRING PHYSICIAN:   CHIEF COMPLAINT:   Chief Complaint  Patient presents with  . Shortness of Breath    HISTORY OF PRESENT ILLNESS: Walter Townsend  is a 83 y.o. male with a known history of hypertension, hyperlipidemia, paroxysmal atrial fibrillation on chronic anticoagulation, BPH and pulmonary fibrosis, on home oxygen.  Patient was brought to emergency room for worsening shortness of breath going on for the past 3-4 days, gradually getting worse especially with exertion.  He also noticed some swelling and his feet in the past week.  Patient denies any fever or chills, no chest pain, no palpitations, no nausea, vomiting, diarrhea.  He was treated for an acute bronchitis episode with Levaquin just 3 weeks ago.  He could not finish the full antibiotic course at that time, due to stomach upset. Test results from emergency room are remarkable for elevated troponin level at 0.56; WBC is slightly elevated at 12.8; BNP is elevated at 2117.  Chest x-ray shows pulmonary fibrosis no acute changes.  EKG is noted without any acute changes.  Oxygen saturation has been stable on home oxygen. Patient is admitted for further evaluation and treatment.   PAST MEDICAL HISTORY:   Past Medical History:  Diagnosis Date  . Cardiac pacemaker [employer] Jud[employer]   . Hyperlipidemia   . Hypertension   . Orthostatic hypotension 08/22/2014  . PAF (paroxysmal atrial fibrillation) (HCC)   . Prostate enlargement   . Pulmonary fibrosis (HCC)     PAST SURGICAL HISTORY:  Past Surgical History:  Procedure Laterality Date  . BACK SURGERY    . FOOT SURGERY    . HERNIA REPAIR    . INSERT / REPLACE / REMOVE PACEMAKER    . PACEMAKER INSERTION  08/26/2013   St. Jude Assurity DDD pacemaker   . PERMANENT PACEMAKER INSERTION N/A 08/26/2013   Procedure: PERMANENT PACEMAKER INSERTION;  Surgeon: Marinus Maw, MD;  Location: [facility] CATH LAB;  Service: Cardiovascular;  Laterality: N/A;  . TEMPORARY PACEMAKER INSERTION N/A 08/25/2013   Procedure: TEMPORARY PACEMAKER INSERTION;  Surgeon: Lesleigh Noe, MD;  Location: [facility] CATH LAB;  Service: Cardiovascular;  Laterality: N/A;    SOCIAL HISTORY:  Social History   Tobacco Use  . Smoking status: Former Smoker    Packs/day: 0.30    Years: 3.00    Pack years: 0.90    Types: Cigarettes    Last attempt to quit: 11/17/1948    Years since quitting: 69.1  . Smokeless tobacco: Never Used  Substance Use Topics  . Alcohol use: No    FAMILY HISTORY:  Family History  Problem Relation Age of Onset  . Heart disease Father   . Heart attack Brother   . Heart attack Sister   . Heart attack Brother   . Heart failure Brother   . Heart attack Brother   . Heart attack Brother   . Cancer Brother        skin  . Heart disease Other   . Coronary artery disease Other   . Heart disease Son 69       stent placement     DRUG ALLERGIES:  Allergies  Allergen Reactions  . Iodinated Diagnostic Agents Rash    PRE-MED WITH PREDNISONE  REVIEW OF SYSTEMS:   CONSTITUTIONAL: No fever, fatigue or weakness.  EYES: No vision changes.  EARS, NOSE, AND THROAT: No tinnitus or ear pain.  RESPIRATORY: Positive for shortness of breath; no cough, wheezing or hemoptysis.  CARDIOVASCULAR: No chest pain, but positive for bilateral lower extremities edema.  GASTROINTESTINAL: No nausea, vomiting, diarrhea or abdominal pain.  GENITOURINARY: No dysuria, hematuria.  ENDOCRINE: No polyuria, nocturia,  HEMATOLOGY: No bleeding SKIN: No rash or lesion. MUSCULOSKELETAL: No joint pain or arthritis.   NEUROLOGIC: No focal weakness.  PSYCHIATRY: No anxiety or depression.   MEDICATIONS AT HOME:  Prior to Admission medications   Medication Sig Start Date End Date  Taking? Authorizing Provider  Calcium Carbonate-Vitamin D (CALCIUM-VITAMIN D) 600-200 MG-UNIT CAPS Take 1 capsule by mouth daily.     Yes [provider]  chlorpheniramine-HYDROcodone (TUSSIONEX PENNKINETIC ER) 10-8 MG/5ML SUER Take 5 mLs by mouth at bedtime as needed for cough. 12/07/17  Yes Merwyn KatosSimonds, David B, MD  cyanocobalamin 100 MCG tablet Take 100 mcg by mouth daily.   Yes [provider]  diltiazem (CARDIZEM CD) 120 MG 24 hr capsule Take 1 capsule (120 mg total) by mouth daily. 04/29/17 12/22/17 Yes Gollan, Tollie Pizzaimothy J, MD  docusate sodium (COLACE) 100 MG capsule Take 200 mg by mouth at bedtime.    Yes [provider]  finasteride (PROSCAR) 5 MG tablet Take 1 tablet (5 mg total) by mouth daily. 09/22/16  Yes Antonieta IbaGollan, Timothy J, MD  lansoprazole (PREVACID) 30 MG capsule TAKE 1 CAPSULE DAILY 10/06/17  Yes Gollan, Tollie Pizzaimothy J, MD  lisinopril (ZESTRIL) 2.5 MG tablet Take 1 tablet (2.5 mg total) by mouth daily. Patient taking differently: Take 2.5 mg by mouth daily as needed.  10/01/16  Yes Gollan, Tollie Pizzaimothy J, MD  potassium chloride SA (K-DUR,KLOR-CON) 20 MEQ tablet Take 2 tablets (40 mEq total) by mouth daily. Patient taking differently: Take 20 mEq by mouth at bedtime.  06/03/17 12/22/17 Yes Enedina FinnerPatel, Sona, MD  XARELTO 20 MG TABS tablet TAKE 1 TABLET DAILY WITH   SUPPER 10/07/17  Yes Gollan, Tollie Pizzaimothy J, MD  atorvastatin (LIPITOR) 20 MG tablet TAKE 1 TABLET DAILY Patient not taking: Reported on 12/22/2017 10/06/17   Antonieta IbaGollan, Timothy J, MD  meclizine (ANTIVERT) 25 MG tablet Take 1 tablet (25 mg total) by mouth 3 (three) times daily as needed for dizziness or nausea. Patient not taking: Reported on 12/22/2017 02/09/17   Emily FilbertWilliams, Jonathan E, MD      PHYSICAL EXAMINATION:   VITAL SIGNS: Blood pressure (!) 110/59, pulse 80, temperature 98.5 F (36.9 C), temperature source Oral, resp. rate 18, height 5' 8.5" (1.74 m), weight 72.5 kg (159 lb 14.4 oz), SpO2 97 %.  GENERAL:  84 y.o.-year-old  patient lying in the bed with no acute distress.  EYES: Pupils equal, round, reactive to light and accommodation. No scleral icterus. Extraocular muscles intact.  HEENT: Head atraumatic, normocephalic. Oropharynx and nasopharynx clear.  NECK:  Supple, no jugular venous distention. No thyroid enlargement, no tenderness.  LUNGS: Reduced breath sounds bilaterally, no wheezing. No use of accessory muscles of respiration.  CARDIOVASCULAR: S1, S2 normal. No S3/S4.  ABDOMEN: Soft, nontender, nondistended. Bowel sounds present. No organomegaly or mass.  EXTREMITIES: 1+ bilateral pedal edema noted.  NEUROLOGIC: No focal weakness.  PSYCHIATRIC: The patient is alert and oriented x 3.  SKIN: No obvious rash, lesion, or ulcer.   LABORATORY PANEL:   CBC Recent Labs  Lab 12/22/17 1914  WBC 12.8*  HGB 10.8*  HCT 32.0*  PLT 319  MCV 90.8  MCH 30.6  MCHC 33.7  RDW 13.9   ------------------------------------------------------------------------------------------------------------------  Chemistries  Recent Labs  Lab 12/22/17 1914 12/22/17 1918  NA 132*  --   K 4.6  --   CL 99*  --   CO2 24  --   GLUCOSE 115*  --   BUN 15  --   CREATININE 0.83  --   CALCIUM 8.5*  --   AST  --  58*  ALT  --  42  ALKPHOS  --  343*  BILITOT  --  0.5   ------------------------------------------------------------------------------------------------------------------ estimated creatinine clearance is 58.2 mL/min (by C-G formula based on SCr of 0.83 mg/dL). ------------------------------------------------------------------------------------------------------------------ No results for input(s): TSH, T4TOTAL, T3FREE, THYROIDAB in the last 72 hours.  Invalid input(s): FREET3   Coagulation profile No results for input(s): INR, PROTIME in the last 168 hours. ------------------------------------------------------------------------------------------------------------------- No results for input(s): DDIMER in  the last 72 hours. -------------------------------------------------------------------------------------------------------------------  Cardiac Enzymes Recent Labs  Lab 12/22/17 1914  TROPONINI 0.56*   ------------------------------------------------------------------------------------------------------------------ Invalid input(s): POCBNP  ---------------------------------------------------------------------------------------------------------------  Urinalysis    Component Value Date/Time   COLORURINE YELLOW (A) 02/09/2017 1006   APPEARANCEUR HAZY (A) 02/09/2017 1006   LABSPEC 1.013 02/09/2017 1006   PHURINE 8.0 02/09/2017 1006   GLUCOSEU NEGATIVE 02/09/2017 1006   HGBUR NEGATIVE 02/09/2017 1006   BILIRUBINUR NEGATIVE 02/09/2017 1006   KETONESUR NEGATIVE 02/09/2017 1006   PROTEINUR NEGATIVE 02/09/2017 1006   NITRITE NEGATIVE 02/09/2017 1006   LEUKOCYTESUR NEGATIVE 02/09/2017 1006     RADIOLOGY: Dg Chest 2 View  Result Date: 12/22/2017 CLINICAL DATA:  84 year old male with shortness of breath. Initial encounter. EXAM: CHEST  2 VIEW COMPARISON:  12/01/2017 and 06/02/2017 chest x-ray. FINDINGS: Marked pulmonary fibrosis similar to prior exam. No new segmental consolidation or pulmonary edema. Cardiomegaly with sequential pacemaker in place. Calcified aorta. No acute osseous abnormality. IMPRESSION: Marked pulmonary fibrosis similar to prior exam. No new segmental consolidation or pulmonary edema. Cardiomegaly with sequential pacemaker in place. Aortic Atherosclerosis (ICD10-I70.0). Electronically Signed   By: Lacy Duverney M.D.   On: 12/22/2017 20:05    EKG: Orders placed or performed during the hospital encounter of 12/22/17  . ED EKG  . ED EKG  . EKG 12-Lead  . EKG 12-Lead  . EKG 12-Lead  . EKG 12-Lead    IMPRESSION AND PLAN:  1.  Acute on chronic respiratory distress.  This could be related to worsening pulmonary fibrosis.  However, will rule out ACS and CHF.  First  troponin was elevated at 0.56; will follow troponin level.  We will check echocardiogram, to evaluate ejection fraction.  Continue oxygen therapy. 2.  Non-ST elevation MI.  Patient denies any chest pain.  We will start aspirin and have cardiology further evaluate him.  Will follow troponin level and check echocardiogram. 3.  Advancing pulmonary fibrosis.  We will have pulmonary specialist to evaluate the patient for further recommendations. 4.  Paroxysmal atrial fibrillation, currently in sinus rhythm, rate controlled.  Continue anticoagulation with Xarelto.  All the records are reviewed and case discussed with ED provider. Management plans discussed with the patient, family and they are in agreement.  CODE STATUS:    Code Status Orders  (From admission, onward)        Start     Ordered   12/22/17 2241  Full code  Continuous     12/22/17 2240    Code Status History    Date Active Date Inactive Code Status Order  ID Comments User Context   06/02/2017 21:48 06/03/2017 20:21 Full Code [id number]  Altamese Dilling, MD Inpatient   08/26/2013 18:45 08/27/2013 14:44 Full Code [id number]  Marinus Maw, MD Inpatient   08/25/2013 13:29 08/26/2013 18:45 Full Code [id number]  Minda Meo, PA-C Inpatient       TOTAL TIME TAKING CARE OF THIS PATIENT: 40 minutes.    Cammy Copa M.D on 12/23/2017 at 1:33 AM  Between 7am to 6pm - Pager - [phone]  After 6pm go to [email or web address] - password [email or web address] [facility]  [facility] [place] [facility]  Office  [phone]  CC: Primary care physician; Rafael Bihari, MD

## 2017-12-23 NOTE — Consult Note (Signed)
ANTICOAGULATION CONSULT NOTE - Initial Consult  Pharmacy Consult for heparin drip Indication: elevated troponin, hx of afib on xaretlo PTA  Allergies  Allergen Reactions  . Iodinated Diagnostic Agents Rash    PRE-MED WITH PREDNISONE    Patient Measurements: Height: 5' 8.5" (174 cm) Weight: 155 lb 3.2 oz (70.4 kg) IBW/kg (Calculated) : 69.55 Heparin Dosing Weight: 72.5kg  Vital Signs: Temp: 97.7 F (36.5 C) (02/06 0743) Temp Source: Oral (02/06 0743) BP: 105/59 (02/06 0743) Pulse Rate: 76 (02/06 0743)  Labs: Recent Labs    12/22/17 1914 12/23/17 0435  HGB 10.8* 10.8*  HCT 32.0* 32.2*  PLT 319 303  CREATININE 0.83 0.77  TROPONINI 0.56* 0.59*    Estimated Creatinine Clearance: 60.4 mL/min (by C-G formula based on SCr of 0.77 mg/dL).   Medical History: Past Medical History:  Diagnosis Date  . Cardiac pacemaker st [employer]   . Diastolic dysfunction    a. 08/2013 Echo: EF 60-65%, no rwma, Gr1 DD, Ao sclerosis w/o stenosis. mildly dil LA/RA, nl RV fxn, PASP 35mmHg.  Marland Kitchen. History of stress test    a. 12/2011 MV: EF 57%, no ischemia->Low risk; b. 09/2016 MV: Ef 55-65%, no ischemia->Low risk.  Marland Kitchen. Hyperlipidemia   . Hypertension   . Orthostatic hypotension 08/22/2014  . PAF (paroxysmal atrial fibrillation) (HCC)    a. CHA2DS2VASc = 4--> Xarelto.  . Prostate enlargement   . PSVT (paroxysmal supraventricular tachycardia) (HCC)   . Pulmonary fibrosis (HCC)    a. On chronic supplemental O2 @ home.    Medications:  Scheduled:  . aspirin  324 mg Oral Once  . [START ON 12/24/2017] aspirin EC  81 mg Oral Daily  . atorvastatin  20 mg Oral Daily  . [START ON 12/24/2017] bisoprolol  2.5 mg Oral Daily  . calcium-vitamin D  1 tablet Oral Daily  . docusate sodium  100 mg Oral BID  . feeding supplement (ENSURE ENLIVE)  237 mL Oral TID BM  . finasteride  5 mg Oral Daily  . furosemide  20 mg Intravenous Daily  . [START ON 12/24/2017] lisinopril  2.5 mg Oral Daily  . methylPREDNISolone  (SOLU-MEDROL) injection  40 mg Intravenous Daily  . [START ON 12/24/2017] metoprolol tartrate  25 mg Oral BID  . multivitamin with minerals  1 tablet Oral Daily  . ondansetron (ZOFRAN) IV  4 mg Intravenous Q6H  . pantoprazole  40 mg Oral Daily  . pneumococcal 23 valent vaccine  0.5 mL Intramuscular Tomorrow-1000  . potassium chloride SA  20 mEq Oral Daily  . cyanocobalamin  100 mcg Oral Daily    Assessment: Patient is a 84 year old male w/ PMH of PAF on xarelto, complete heart block w/ pacemaker, systolic heart failure, pulmonary fibrosis who presents with SOB, edema, chest pain and elevated troponin. Pharmacy consulted to dose heparin drip in order to bridge from xarelto for possible heart cath. Last dose of xarelto was 2/5 @ 1700. Baseline HL, APTT and INR ordered. Will need to dose off of APTT until HL and APTT corollate.  Will start drip 24hr after last xarelto dose.  Goal of Therapy:  aPTT 66-102 seconds Monitor platelets by anticoagulation protocol: Yes   Plan:  Start drip @ 1700 today. No bolus Start heparin infusion at 1000 units/hr Check anti-Xa level in 8 hours and daily while on heparin Continue to monitor H&H and platelets  Ammie Warrick D Andreal Vultaggio, Pharm.D, BCPS Clinical Pharmacist  12/23/2017,1:43 PM

## 2017-12-23 NOTE — Progress Notes (Signed)
Patient presently resting in the bed at this time, alert and oriented, cardiology at bedside, c/o of nausea zofran 4 mg given as per order , consult for pulmonary pending .

## 2017-12-24 DIAGNOSIS — I214 Non-ST elevation (NSTEMI) myocardial infarction: Principal | ICD-10-CM

## 2017-12-24 LAB — CBC
HCT: 33.5 % — ABNORMAL LOW (ref 40.0–52.0)
Hemoglobin: 10.9 g/dL — ABNORMAL LOW (ref 13.0–18.0)
MCH: 29.4 pg (ref 26.0–34.0)
MCHC: 32.5 g/dL (ref 32.0–36.0)
MCV: 90.6 fL (ref 80.0–100.0)
PLATELETS: 319 10*3/uL (ref 150–440)
RBC: 3.7 MIL/uL — AB (ref 4.40–5.90)
RDW: 13.8 % (ref 11.5–14.5)
WBC: 12.3 10*3/uL — ABNORMAL HIGH (ref 3.8–10.6)

## 2017-12-24 LAB — BASIC METABOLIC PANEL
Anion gap: 11 (ref 5–15)
BUN: 22 mg/dL — ABNORMAL HIGH (ref 6–20)
CALCIUM: 8.5 mg/dL — AB (ref 8.9–10.3)
CO2: 23 mmol/L (ref 22–32)
CREATININE: 0.69 mg/dL (ref 0.61–1.24)
Chloride: 102 mmol/L (ref 101–111)
GFR calc Af Amer: >60 mL/min (ref >60)
GLUCOSE: 147 mg/dL — AB (ref 65–99)
Potassium: 4.5 mmol/L (ref 3.5–5.1)
SODIUM: 136 mmol/L (ref 135–145)

## 2017-12-24 LAB — HEPARIN LEVEL (UNFRACTIONATED)
Heparin Unfractionated: 0.54 IU/mL (ref 0.30–0.70)
Heparin Unfractionated: 0.45 IU/mL (ref 0.30–0.70)

## 2017-12-24 LAB — MAGNESIUM: Magnesium: 1.9 mg/dL (ref 1.7–2.4)

## 2017-12-24 LAB — APTT
APTT: 93 s — AB (ref 24–36)
aPTT: 63 seconds — ABNORMAL HIGH (ref 24–36)

## 2017-12-24 LAB — GLUCOSE, CAPILLARY: Glucose-Capillary: 140 mg/dL — ABNORMAL HIGH (ref 65–99)

## 2017-12-24 LAB — TROPONIN I: Troponin I: 0.58 ng/mL (ref <0.03)

## 2017-12-24 MED ORDER — SODIUM CHLORIDE 0.9% FLUSH
3.0000 mL | Freq: Two times a day (BID) | INTRAVENOUS | Status: DC
  Administered 2017-12-24: 3 mL via INTRAVENOUS

## 2017-12-24 MED ORDER — SODIUM CHLORIDE 0.9 % IV SOLN: INTRAVENOUS | Status: DC

## 2017-12-24 MED ORDER — PREDNISONE 20 MG PO TABS: 30.0000 mg | ORAL_TABLET | Freq: Every day | ORAL | Status: DC

## 2017-12-24 MED ORDER — SODIUM CHLORIDE 0.9% FLUSH: 3.0000 mL | INTRAVENOUS | Status: DC | PRN

## 2017-12-24 MED ORDER — SODIUM CHLORIDE 0.9 % IV SOLN: 250.0000 mL | INTRAVENOUS | Status: DC | PRN

## 2017-12-24 NOTE — Consult Note (Signed)
Provided patient with "Living Better with Heart Failure" packet. Briefly reviewed definition of heart failure and signs and symptoms of an exacerbation. Reviewed importance of and reason behind checking weight daily in the AM, after using the bathroom, but before getting dressed. Discussed when to call the Dr= weight gain of >2lb overnight of 5lb in a week,  Discussed yellow zone= call MD: weight gain of >2lb overnight of 5lb in a week, increased swelling, increased SOB when lying down, chest discomfort, dizziness, increased fatigue Red Zone= call 911: struggle to breath, fainting or near fainting, significant chest pain   Fluid restriction <2L/day Reviewed medication changes:stop diltiazem, start bisoprolol, furosemide, continue lisinopril Explained briefly why pt is on the medications (either make you feel better, live longer or keep you out of the hospital) and discussed monitoring and side effects Patient weights himself twice daily already. Reviewed the reasons behind weighing daily in the AM. Recording this number and calling cardiologist if weight gain >3lb overnight or 5lb in a week.  Patient states that he has been sick on his stomach for a long time and has not had much of an appetite. Said he ate more today than he has in a long time.  Explained to the patient what it means to have an EF of 30-35% and how we plan to manage this and help him feel better.  Educated patient on his xarelto. Patient reports no signs of bleeding. Reviewed these signs with him. Advised him to avoid NSAIDS, take APAP for pain. Sat and listened to patient talk about his late wife and his family for awhile. Patient became tearful while talking about his late wife. Olene FlossMelissa D Jezabelle Chisolm, Pharm.D, BCPS Clinical Pharmacist

## 2017-12-24 NOTE — H&P (View-Only) (Signed)
Progress Note  Patient Name: Walter PickettWillard L Drennen Date of Encounter: 12/24/2017  Primary Cardiologist: Mariah MillingGollan  Subjective   No chest pain. SOB improved. Nausea improved. Weight down 2 pounds today. Net - 2.5 L for the admission. Remains on heparin gtt. HGB stable a 10.8. SCr stable at 0.69. Potassium at goal at 4.5.   Inpatient Medications    Scheduled Meds: . aspirin  324 mg Oral Once  . aspirin EC  81 mg Oral Daily  . atorvastatin  20 mg Oral Daily  . bisoprolol  2.5 mg Oral Daily  . calcium-vitamin D  1 tablet Oral Daily  . docusate sodium  100 mg Oral BID  . feeding supplement (ENSURE ENLIVE)  237 mL Oral TID BM  . finasteride  5 mg Oral Daily  . furosemide  20 mg Intravenous Daily  . lisinopril  2.5 mg Oral Daily  . methylPREDNISolone (SOLU-MEDROL) injection  40 mg Intravenous Daily  . multivitamin with minerals  1 tablet Oral Daily  . ondansetron (ZOFRAN) IV  4 mg Intravenous Q6H  . pantoprazole  40 mg Oral Daily  . pneumococcal 23 valent vaccine  0.5 mL Intramuscular Tomorrow-1000  . potassium chloride SA  20 mEq Oral Daily  . cyanocobalamin  100 mcg Oral Daily   Continuous Infusions: . heparin 1,100 Units/hr (12/24/17 0450)   PRN Meds: acetaminophen **OR** acetaminophen, bisacodyl, chlorpheniramine-HYDROcodone, HYDROcodone-acetaminophen, promethazine, traZODone   Vital Signs    Vitals:   12/24/17 0134 12/24/17 0432 12/24/17 0745 12/24/17 0750  BP:  (!) 94/54 111/72 103/68  Pulse:  70 71 69  Resp:  16 18 17   Temp:  98.3 F (36.8 C)  98.6 F (37 C)  TempSrc:      SpO2:  99% 99% 98%  Weight: 156 lb (70.8 kg)     Height:        Intake/Output Summary (Last 24 hours) at 12/24/2017 0840 Last data filed at 12/24/2017 0100 Gross per 24 hour  Intake 280 ml  Output 1345 ml  Net -1065 ml   Filed Weights   12/23/17 0300 12/23/17 1400 12/24/17 0134  Weight: 155 lb 3.2 oz (70.4 kg) 158 lb 8 oz (71.9 kg) 156 lb (70.8 kg)    Telemetry    V-paced - Personally  Reviewed  ECG    n/a - Personally Reviewed  Physical Exam   GEN: No acute distress.   Neck: No JVD. Cardiac: RRR, no murmurs, rubs, or gallops.  Respiratory: Diffused bilateral rhonchi with diminished breath sounds.  GI: Soft, nontender, non-distended.   MS: No edema; No deformity. Neuro:  Alert and oriented x 3; Nonfocal.  Psych: Normal affect.  Labs    Chemistry Recent Labs  Lab 12/22/17 1918 12/23/17 0435 12/23/17 1551 12/24/17 0616  NA  --  135 134* 136  K  --  3.3* 4.2 4.5  CL  --  101 100* 102  CO2  --  25 24 23   GLUCOSE  --  125* 180* 147*  BUN  --  14 17 22*  CREATININE  --  0.77 0.74 0.69  CALCIUM  --  8.3* 8.5* 8.5*  PROT 6.5  --   --   --   ALBUMIN 2.6*  --   --   --   AST 58*  --   --   --   ALT 42  --   --   --   ALKPHOS 343*  --   --   --   BILITOT  0.5  --   --   --   GFRNONAA  --  >60 >60 >60  GFRAA  --  >60 >60 >60  ANIONGAP  --  9 10 11      Hematology Recent Labs  Lab 12/22/17 1914 12/23/17 0435 12/24/17 0113  WBC 12.8* 11.7* 12.3*  RBC 3.52* 3.60* 3.70*  HGB 10.8* 10.8* 10.9*  HCT 32.0* 32.2* 33.5*  MCV 90.8 89.5 90.6  MCH 30.6 30.0 29.4  MCHC 33.7 33.6 32.5  RDW 13.9 13.4 13.8  PLT 319 303 319    Cardiac Enzymes Recent Labs  Lab 12/22/17 1914 12/23/17 0435  TROPONINI 0.56* 0.59*   No results for input(s): TROPIPOC in the last 168 hours.   BNP Recent Labs  Lab 12/22/17 1914  BNP 2,117.0*     DDimer No results for input(s): DDIMER in the last 168 hours.   Radiology    Dg Chest 2 View  Result Date: 12/22/2017 IMPRESSION: Marked pulmonary fibrosis similar to prior exam. No new segmental consolidation or pulmonary edema. Cardiomegaly with sequential pacemaker in place. Aortic Atherosclerosis (ICD10-I70.0). Electronically Signed   By: Lacy Duverney M.D.   On: 12/22/2017 20:05    Cardiac Studies   TTE 12/23/2017: Study Conclusions  - Left ventricle: The cavity size was mildly dilated. Wall   thickness was increased  in a pattern of mild LVH. Systolic   function was moderately to severely reduced. The estimated   ejection fraction was in the range of 30% to 35%. There is severe   hypokinesis of the inferior, inferoseptal, and apical myocardium.   The study is not technically sufficient to allow evaluation of LV   diastolic function. - Aortic valve: Trileaflet; moderately thickened, mildly calcified   leaflets. Cusp separation was reduced. Though there is no   significant stenosis by velocity and mean gradient criteria,   degree of stenosis may be underestimated due to decreased LVEF. - Mitral valve: Calcified annulus. Mildly thickened leaflets .   There was mild to moderate regurgitation. - Left atrium: The atrium was moderately dilated. - Right ventricle: The cavity size was mildly dilated. Systolic   function was mildly reduced. - Right atrium: The atrium was mildly dilated. - Tricuspid valve: There was mild-moderate regurgitation. - Pulmonary arteries: Systolic pressure was mildly to moderately   increased, in the range of 40 mm Hg to 45 mm Hg.  Patient Profile     84 y.o. male with history of pulmonary fibrosis, diast dysfxn, PSVT, PAF on xarelto, HTN, HL, and CHB s/p PPM, who is being seen today for the evaluation of acute systolic CHF with elevated troponin at the request of Dr. Nemiah Commander.  Assessment & Plan    1. Acute systolic CHF with pulmonary hypertension: -Echo 2/6 with newly reduced EF as above -Continue to diuresis with IV Lasix 20 mg daily for today -Planning for [facility] 2/7 to evaluate his cardiomyopathy -Diltiazem stopped as below -Continue bisoprolol -Continue lisinopril  -If BP tolerates, consider adding spironolactone prior to discharge -Uncertain if his BP would tolerate transition from ACEi to [facility] given history of labile BP -CHF education -Daily weights with strict Is and Os -NPO at midnight -Risks and benefits of cardiac catheterization have been discussed with the  patient including risks of bleeding, bruising, infection, kidney damage, stroke, heart attack, and death. The patient understands these risks and is willing to proceed with the procedure. All questions have been answered and concerns listened to  2. Elevated troponin: -No chest pain -Continue  to cycle until peaks -Heparin gtt -Likely supply demand ischemia in the setting of volume overload with known CAD on CT chest -Given his strong family history of CAD, echo with WMA concerning for ischemia, and CT chest documenting coronary atherosclerosis/calcification there is a strong likelihood he has significant CAD -ASA  3. PAF: -V paced -Diltiazem stopped given his cardiomyopathy -Tolerating bisoprolol 2.5 mg daily -Heparin gtt in place of Xarelto (last dose of Xarelto in the evening of 2/5) -Status post LHC would resume Xarelto prior to discharge given CHADS2VASc of at least 5 (CHF, HTN age x 2, vascular disease) -Depending on LHC he may require triple therapy for 30 days  4. CHB s/p PPM: -Device appears to be functioning normally on tele -Followed by EP  5. Chronic respiratory failure with hypoxia secondary to pulmonary fibrosis: -Supplemental ocygen at 2 L via nasal cannula  6. Anemia: -Appears stable -Per IM -Will need to monitor closely if he does require triple therapy   7. Hyperglycemia: -On steroids -Check A1c  8. Leukocytosis: -No obvious signs of infection -Likely from steroids -Would consider tapering off steroids, defer to IM   For questions or updates, please contact [facility] Please consult [email or web address] for contact info under Cardiology/STEMI.    Signed, Eula Listen, PA-C [facility] Pager: [phone] 12/24/2017, 8:40 AM

## 2017-12-24 NOTE — Discharge Instructions (Addendum)
° °  Follow-up with primary care physician in 5-7 days. Follow-up with Dr. Mariah MillingGollan in 5-7 days or sooner as needed Continue home oxygen    Information on my medicine - XARELTO (Rivaroxaban)  This medication education was reviewed with me or my healthcare representative as part of my discharge preparation.  The pharmacist that spoke with me during my hospital stay was:  Olene FlossMelissa D Maccia, [facility]RPH  Why was Xarelto prescribed for you? Xarelto was prescribed for you to reduce the risk of a blood clot forming that can cause a stroke if you have a medical condition called atrial fibrillation (a type of irregular heartbeat).  What do you need to know about xarelto ? Take your Xarelto ONCE DAILY at the same time every day with your evening meal. If you have difficulty swallowing the tablet whole, you may crush it and mix in applesauce just prior to taking your dose.  Take Xarelto exactly as prescribed by your doctor and DO NOT stop taking Xarelto without talking to the doctor who prescribed the medication.  Stopping without other stroke prevention medication to take the place of Xarelto may increase your risk of developing a clot that causes a stroke.  Refill your prescription before you run out.  After discharge, you should have regular check-up appointments with your healthcare provider that is prescribing your Xarelto.  In the future your dose may need to be changed if your kidney function or weight changes by a significant amount.  What do you do if you miss a dose? If you are taking Xarelto ONCE DAILY and you miss a dose, take it as soon as you remember on the same day then continue your regularly scheduled once daily regimen the next day. Do not take two doses of Xarelto at the same time or on the same day.   Important Safety Information A possible side effect of Xarelto is bleeding. You should call your healthcare provider right away if you experience any of the following: ? Bleeding  from an injury or your nose that does not stop. ? Unusual colored urine (red or dark brown) or unusual colored stools (red or black). ? Unusual bruising for unknown reasons. ? A serious fall or if you hit your head (even if there is no bleeding).  Some medicines may interact with Xarelto and might increase your risk of bleeding while on Xarelto. To help avoid this, consult your healthcare provider or pharmacist prior to using any new prescription or non-prescription medications, including herbals, vitamins, non-steroidal anti-inflammatory drugs (NSAIDs) and supplements.  This website has more information on Xarelto: [email or web address].

## 2017-12-24 NOTE — Progress Notes (Signed)
[facility] [place] [facility]   PATIENT NAME: Walter Townsend    MR#:  [id number]  DATE OF BIRTH:  06-Dec-1924  SUBJECTIVE:   Complains of mild cough.  Denies any chest pain today. REVIEW OF SYSTEMS:   Review of Systems  Constitutional: Negative for chills, fever and weight loss.  HENT: Negative for ear discharge, ear pain and nosebleeds.   Eyes: Negative for blurred vision, pain and discharge.  Respiratory: Negative for sputum production, shortness of breath, wheezing and stridor.   Cardiovascular: Negative for chest pain, palpitations, orthopnea and PND.  Gastrointestinal: Negative for abdominal pain, diarrhea, nausea and vomiting.  Genitourinary: Negative for frequency and urgency.  Musculoskeletal: Negative for back pain and joint pain.  Neurological: Positive for weakness. Negative for sensory change, speech change and focal weakness.  Psychiatric/Behavioral: Negative for depression and hallucinations. The patient is not nervous/anxious.    Tolerating Diet: Yes Tolerating PT: Ambulatory  DRUG ALLERGIES:   Allergies  Allergen Reactions  . Iodinated Diagnostic Agents Rash    PRE-MED WITH PREDNISONE    VITALS:  Blood pressure 103/68, pulse 69, temperature 98.6 F (37 C), resp. rate 17, height 5' 8.5" (1.74 m), weight 70.8 kg (156 lb), SpO2 98 %.  PHYSICAL EXAMINATION:   Physical Exam  GENERAL:  84 y.o.-year-old patient lying in the bed with no acute distress.  EYES: Pupils equal, round, reactive to light and accommodation. No scleral icterus. Extraocular muscles intact.  HEENT: Head atraumatic, normocephalic. Oropharynx and nasopharynx clear.  NECK:  Supple, no jugular venous distention. No thyroid enlargement, no tenderness.  LUNGS: Normal breath sounds bilaterally, no wheezing, rales, rhonchi. No use of accessory muscles of respiration.  CARDIOVASCULAR: S1, S2 normal. No murmurs, rubs, or gallops.  ABDOMEN: Soft, nontender,  nondistended. Bowel sounds present. No organomegaly or mass.  EXTREMITIES: No cyanosis, clubbing or edema b/l.    NEUROLOGIC: Cranial nerves II through XII are intact. No focal Motor or sensory deficits b/l.   PSYCHIATRIC:  patient is alert and oriented x 3.  SKIN: No obvious rash, lesion, or ulcer.   LABORATORY PANEL:  CBC Recent Labs  Lab 12/24/17 0113  WBC 12.3*  HGB 10.9*  HCT 33.5*  PLT 319    Chemistries  Recent Labs  Lab 12/22/17 1918  12/24/17 0616 12/24/17 0859  NA  --    < > 136  --   K  --    < > 4.5  --   CL  --    < > 102  --   CO2  --    < > 23  --   GLUCOSE  --    < > 147*  --   BUN  --    < > 22*  --   CREATININE  --    < > 0.69  --   CALCIUM  --    < > 8.5*  --   MG  --   --   --  1.9  AST 58*  --   --   --   ALT 42  --   --   --   ALKPHOS 343*  --   --   --   BILITOT 0.5  --   --   --    < > = values in this interval not displayed.   Cardiac Enzymes Recent Labs  Lab 12/24/17 0859  TROPONINI 0.58*   RADIOLOGY:  Dg Chest 2 View  Result Date: 12/22/2017 CLINICAL DATA:  84 year old male with shortness of breath. Initial encounter. EXAM: CHEST  2 VIEW COMPARISON:  12/01/2017 and 06/02/2017 chest x-ray. FINDINGS: Marked pulmonary fibrosis similar to prior exam. No new segmental consolidation or pulmonary edema. Cardiomegaly with sequential pacemaker in place. Calcified aorta. No acute osseous abnormality. IMPRESSION: Marked pulmonary fibrosis similar to prior exam. No new segmental consolidation or pulmonary edema. Cardiomegaly with sequential pacemaker in place. Aortic Atherosclerosis (ICD10-I70.0). Electronically Signed   By: Lacy Duverney M.D.   On: 12/22/2017 20:05   ASSESSMENT AND PLAN:  84 year old male with past medical history significant for pulmonary fibrosis on chronic 2 L home oxygen, paroxysmal atrial fibrillation on anticoagulation, hypertension, hyperlipidemia presents to the hospital secondary to worsening shortness of breath and chest  pain  1. Acute on chronic respiratory failure-secondary to pulmonary fibrosis -Also fluid retention, likely diastolic dysfunction. -Received 1 dose of Lasix. Now on 2 L oxygen which uses chronic oxygen -Received IV  steroids.  Patient currently is breathing comfortably.  Does not seem to have pulmonary fibrosis flare at present.  Will stop steroids.  - IV Lasix 20 mg daily -Pulmonary consult appreciated -cardiology consult appreciated.  Currently he is on bisoprolol 2.5 mg daily, as needed lisinopril 2.5 mg. -Patient's Xarelto is on hold -  Patient is scheduled for cardiac cath tomorrow -Echocardiogram showed LVEF 30-35% with global hypokinesis but severe hypokinesis/akinesis involving the inferoseptal and inferior walls, as well as the ape  2. History of tachycardia palpitations, atrial fibrillation-status post pacemaker for AV block. -On Cardizem for rate control -Has history of chronic atypical chest pain -Continue Xarelto for anticoagulation.  3. Elevated troponin-likely demand ischemia from respiratory distress on presentation vs rule out underlying coronary artery disease given wall motion findings on prior CTA and echo -Cardiology consult appreciated  4. Hypokalemia-being replaced  5. DVT prophylaxis-on heparin drip  6.  Chronic anemia -Hemoglobin stable at 10.  No history of active bleeding at present  Patient ambulates with a walker at baseline. Encourage ambulation     Case discussed with Care Management/Social Worker. Management plans discussed with the patient, family and they are in agreement.  CODE STATUS: Full  DVT Prophylaxis: Heparin  TOTAL TIME TAKING CARE OF THIS PATIENT: 30 minutes.  >50% time spent on counselling and coordination of care  POSSIBLE D/C IN 1-2 DAYS, DEPENDING ON CLINICAL CONDITION.  Note: This dictation was prepared with Dragon dictation along with smaller phrase technology. Any transcriptional errors that result from this  process are unintentional.  Enedina Finner M.D on 12/24/2017 at 5:32 PM  Between 7am to 6pm - Pager - [phone]  After 6pm go to [email or web address] - password [email or web address]  [facility]  Office  [phone]  CC: Primary care physician; Rafael Bihari, MDPatient ID: [name], male   DOB: 06-Apr-1925, 84 y.o.   MRN: [id number]

## 2017-12-24 NOTE — Consult Note (Addendum)
ANTICOAGULATION CONSULT NOTE Consult  Pharmacy Consult for heparin drip Indication: elevated troponin, hx of afib on xaretlo PTA  Allergies  Allergen Reactions  . Iodinated Diagnostic Agents Rash    PRE-MED WITH PREDNISONE    Patient Measurements: Height: 5' 8.5" (174 cm) Weight: 156 lb (70.8 kg) IBW/kg (Calculated) : 69.55 Heparin Dosing Weight: 72.5kg  Vital Signs: Temp: 98.6 F (37 C) (02/07 0750) BP: 103/68 (02/07 0750) Pulse Rate: 69 (02/07 0750)  Labs: Recent Labs    12/22/17 1914 12/23/17 0435 12/23/17 1344 12/23/17 1551 12/24/17 0113 12/24/17 0616 12/24/17 0859 12/24/17 1659  HGB 10.8* 10.8*  --   --  10.9*  --   --   --   HCT 32.0* 32.2*  --   --  33.5*  --   --   --   PLT 319 303  --   --  319  --   --   --   APTT  --   --  38*  --  63*  --  93*  --   LABPROT  --   --  16.9*  --   --   --   --   --   INR  --   --  1.39  --   --   --   --   --   HEPARINUNFRC  --   --  0.75*  --   --   --  0.54 0.45  CREATININE 0.83 0.77  --  0.74  --  0.69  --   --   TROPONINI 0.56* 0.59*  --   --   --   --  0.58*  --     Estimated Creatinine Clearance: 60.4 mL/min (by C-G formula based on SCr of 0.69 mg/dL).   Medical History: Past Medical History:  Diagnosis Date  . Cardiac pacemaker [facility] [employer]   . Diastolic dysfunction    a. 08/2013 Echo: EF 60-65%, no rwma, Gr1 DD, Ao sclerosis w/o stenosis. mildly dil LA/RA, nl RV fxn, PASP 35mmHg.  Marland Kitchen. History of stress test    a. 12/2011 MV: EF 57%, no ischemia->Low risk; b. 09/2016 MV: Ef 55-65%, no ischemia->Low risk.  Marland Kitchen. Hyperlipidemia   . Hypertension   . Orthostatic hypotension 08/22/2014  . PAF (paroxysmal atrial fibrillation) (HCC)    a. CHA2DS2VASc = 4--> Xarelto.  . Prostate enlargement   . PSVT (paroxysmal supraventricular tachycardia) (HCC)   . Pulmonary fibrosis (HCC)    a. On chronic supplemental O2 @ home.    Medications:  Scheduled:  . aspirin  324 mg Oral Once  . aspirin EC  81 mg Oral Daily  .  atorvastatin  20 mg Oral Daily  . bisoprolol  2.5 mg Oral Daily  . calcium-vitamin D  1 tablet Oral Daily  . docusate sodium  100 mg Oral BID  . feeding supplement (ENSURE ENLIVE)  237 mL Oral TID BM  . finasteride  5 mg Oral Daily  . furosemide  20 mg Intravenous Daily  . lisinopril  2.5 mg Oral Daily  . multivitamin with minerals  1 tablet Oral Daily  . pantoprazole  40 mg Oral Daily  . pneumococcal 23 valent vaccine  0.5 mL Intramuscular Tomorrow-1000  . potassium chloride SA  20 mEq Oral Daily  . sodium chloride flush  3 mL Intravenous Q12H  . cyanocobalamin  100 mcg Oral Daily    Assessment: Patient is a 84 year old male w/ PMH of PAF on xarelto, complete heart  block w/ pacemaker, systolic heart failure, pulmonary fibrosis who presents with SOB, edema, chest pain and elevated troponin. Pharmacy consulted to dose heparin drip in order to bridge from xarelto for possible heart cath. Last dose of xarelto was 2/5 @ 1700. Baseline HL, APTT and INR ordered. Will need to dose off of APTT until HL and APTT corollate.  Will start drip 24hr after last xarelto dose.  Goal of Therapy:  aPTT 66-102 seconds Monitor platelets by anticoagulation protocol: Yes   Plan:   APTT therapeutic at 93. HL also therapeutic at 0.54. Level corollate. Can now dose off of HL. Recheck in 8 hours for confirmation.  Olene Floss, Pharm.D, BCPS Clinical Pharmacist  2/7@1800  HL 0.45, therapeutic, will recheck heparin level with am labs and continue heparin iv 1100 units/hr.   Luan Pulling, PharmD, MBA, BCGP Clinical Pharmacist [facility]     12/24/2017,5:50 PM   2/8 AM heparin level 0.38. Continue current regimen. Recheck heparin level and CBC with tomorrow AM labs.  Fulton Reek, PharmD, BCPS  12/25/17 6:56 AM

## 2017-12-24 NOTE — Consult Note (Signed)
ANTICOAGULATION CONSULT NOTE - Initial Consult  Pharmacy Consult for heparin drip Indication: elevated troponin, hx of afib on xaretlo PTA  Allergies  Allergen Reactions  . Iodinated Diagnostic Agents Rash    PRE-MED WITH PREDNISONE    Patient Measurements: Height: 5' 8.5" (174 cm) Weight: 156 lb (70.8 kg) IBW/kg (Calculated) : 69.55 Heparin Dosing Weight: 72.5kg  Vital Signs: Temp: 98.6 F (37 C) (02/07 0750) BP: 103/68 (02/07 0750) Pulse Rate: 69 (02/07 0750)  Labs: Recent Labs    12/22/17 1914 12/23/17 0435 12/23/17 1344 12/23/17 1551 12/24/17 0113 12/24/17 0616 12/24/17 0859  HGB 10.8* 10.8*  --   --  10.9*  --   --   HCT 32.0* 32.2*  --   --  33.5*  --   --   PLT 319 303  --   --  319  --   --   APTT  --   --  38*  --  63*  --  93*  LABPROT  --   --  16.9*  --   --   --   --   INR  --   --  1.39  --   --   --   --   HEPARINUNFRC  --   --  0.75*  --   --   --  0.54  CREATININE 0.83 0.77  --  0.74  --  0.69  --   TROPONINI 0.56* 0.59*  --   --   --   --   --     Estimated Creatinine Clearance: 60.4 mL/min (by C-G formula based on SCr of 0.69 mg/dL).   Medical History: Past Medical History:  Diagnosis Date  . Cardiac pacemaker [facility] [employer]   . Diastolic dysfunction    a. 08/2013 Echo: EF 60-65%, no rwma, Gr1 DD, Ao sclerosis w/o stenosis. mildly dil LA/RA, nl RV fxn, PASP 35mmHg.  Marland Kitchen. History of stress test    a. 12/2011 MV: EF 57%, no ischemia->Low risk; b. 09/2016 MV: Ef 55-65%, no ischemia->Low risk.  Marland Kitchen. Hyperlipidemia   . Hypertension   . Orthostatic hypotension 08/22/2014  . PAF (paroxysmal atrial fibrillation) (HCC)    a. CHA2DS2VASc = 4--> Xarelto.  . Prostate enlargement   . PSVT (paroxysmal supraventricular tachycardia) (HCC)   . Pulmonary fibrosis (HCC)    a. On chronic supplemental O2 @ home.    Medications:  Scheduled:  . aspirin  324 mg Oral Once  . aspirin EC  81 mg Oral Daily  . atorvastatin  20 mg Oral Daily  . bisoprolol  2.5 mg Oral  Daily  . calcium-vitamin D  1 tablet Oral Daily  . docusate sodium  100 mg Oral BID  . feeding supplement (ENSURE ENLIVE)  237 mL Oral TID BM  . finasteride  5 mg Oral Daily  . furosemide  20 mg Intravenous Daily  . lisinopril  2.5 mg Oral Daily  . methylPREDNISolone (SOLU-MEDROL) injection  40 mg Intravenous Daily  . multivitamin with minerals  1 tablet Oral Daily  . ondansetron (ZOFRAN) IV  4 mg Intravenous Q6H  . pantoprazole  40 mg Oral Daily  . pneumococcal 23 valent vaccine  0.5 mL Intramuscular Tomorrow-1000  . potassium chloride SA  20 mEq Oral Daily  . cyanocobalamin  100 mcg Oral Daily    Assessment: Patient is a 84 year old male w/ PMH of PAF on xarelto, complete heart block w/ pacemaker, systolic heart failure, pulmonary fibrosis who presents with SOB, edema,  chest pain and elevated troponin. Pharmacy consulted to dose heparin drip in order to bridge from xarelto for possible heart cath. Last dose of xarelto was 2/5 @ 1700. Baseline HL, APTT and INR ordered. Will need to dose off of APTT until HL and APTT corollate.  Will start drip 24hr after last xarelto dose.  Goal of Therapy:  aPTT 66-102 seconds Monitor platelets by anticoagulation protocol: Yes   Plan:   APTT therapeutic at 93. HL also therapeutic at 0.54. Level corollate. Can now dose off of HL. Recheck in 8 hours for confirmation.  Olene Floss, Pharm.D, BCPS Clinical Pharmacist  12/24/2017,9:37 AM

## 2017-12-24 NOTE — Consult Note (Addendum)
ANTICOAGULATION CONSULT NOTE - Initial Consult  Pharmacy Consult for heparin drip Indication: elevated troponin, hx of afib on xaretlo PTA  Allergies  Allergen Reactions  . Iodinated Diagnostic Agents Rash    PRE-MED WITH PREDNISONE    Patient Measurements: Height: 5' 8.5" (174 cm) Weight: 156 lb (70.8 kg) IBW/kg (Calculated) : 69.55 Heparin Dosing Weight: 72.5kg  Vital Signs: Temp: 98 F (36.7 C) (02/06 2006) BP: 111/60 (02/06 2006) Pulse Rate: 83 (02/06 2006)  Labs: Recent Labs    12/22/17 1914 12/23/17 0435 12/23/17 1344 12/23/17 1551 12/24/17 0113  HGB 10.8* 10.8*  --   --  10.9*  HCT 32.0* 32.2*  --   --  33.5*  PLT 319 303  --   --  319  APTT  --   --  38*  --  63*  LABPROT  --   --  16.9*  --   --   INR  --   --  1.39  --   --   HEPARINUNFRC  --   --  0.75*  --   --   CREATININE 0.83 0.77  --  0.74  --   TROPONINI 0.56* 0.59*  --   --   --     Estimated Creatinine Clearance: 60.4 mL/min (by C-G formula based on SCr of 0.74 mg/dL).   Medical History: Past Medical History:  Diagnosis Date  . Cardiac pacemaker [facility] [employer]   . Diastolic dysfunction    a. 08/2013 Echo: EF 60-65%, no rwma, Gr1 DD, Ao sclerosis w/o stenosis. mildly dil LA/RA, nl RV fxn, PASP .  Marland Kitchen History of stress test    a. 12/2011 MV: EF 57%, no ischemia->Low risk; b. 09/2016 MV: Ef 55-65%, no ischemia->Low risk.  Marland Kitchen Hyperlipidemia   . Hypertension   . Orthostatic hypotension 08/22/2014  . PAF (paroxysmal atrial fibrillation) (HCC)    a. CHA2DS2VASc = 4--> Xarelto.  . Prostate enlargement   . PSVT (paroxysmal supraventricular tachycardia) (HCC)   . Pulmonary fibrosis (HCC)    a. On chronic supplemental O2 @ home.    Medications:  Scheduled:  . aspirin  324 mg Oral Once  . aspirin EC  81 mg Oral Daily  . atorvastatin  20 mg Oral Daily  . bisoprolol  2.5 mg Oral Daily  . calcium-vitamin D  1 tablet Oral Daily  . docusate sodium  100 mg Oral BID  . feeding supplement (ENSURE  ENLIVE)  237 mL Oral TID BM  . finasteride  5 mg Oral Daily  . furosemide  20 mg Intravenous Daily  . lisinopril  2.5 mg Oral Daily  . methylPREDNISolone (SOLU-MEDROL) injection  40 mg Intravenous Daily  . multivitamin with minerals  1 tablet Oral Daily  . ondansetron (ZOFRAN) IV  4 mg Intravenous Q6H  . pantoprazole  40 mg Oral Daily  . pneumococcal 23 valent vaccine  0.5 mL Intramuscular Tomorrow-1000  . potassium chloride SA  20 mEq Oral Daily  . cyanocobalamin  100 mcg Oral Daily    Assessment: Patient is a 84 year old male w/ PMH of PAF on xarelto, complete heart block w/ pacemaker, systolic heart failure, pulmonary fibrosis who presents with SOB, edema, chest pain and elevated troponin. Pharmacy consulted to dose heparin drip in order to bridge from xarelto for possible heart cath. Last dose of xarelto was 2/5 @ 1700. Baseline HL, APTT and INR ordered. Will need to dose off of APTT until HL and APTT corollate.  Will start drip 24hr  after last xarelto dose.  Goal of Therapy:  aPTT 66-102 seconds Monitor platelets by anticoagulation protocol: Yes   Plan:  Start drip @ 1700 today. No bolus Start heparin infusion at 1000 units/hr Check anti-Xa level in 8 hours and daily while on heparin Continue to monitor H&H and platelets   2/7 0100 aPTT 63. Increase rate to 1100 units/hr and recheck in 6 hours.  Olene FlossMelissa D Maccia, Pharm.D, BCPS Clinical Pharmacist  12/24/2017,2:54 AM

## 2017-12-24 NOTE — Plan of Care (Signed)
  Progressing Activity: Ability to tolerate increased activity will improve 12/24/2017 0219 - Progressing by Stefan Churchogers, Caydon Feasel M, RN Cardiac: Ability to achieve and maintain adequate cardiovascular perfusion will improve 12/24/2017 0219 - Progressing by Stefan Churchogers, Edie Darley M, RN

## 2017-12-24 NOTE — Progress Notes (Signed)
Progress Note  Patient Name: Walter Townsend Date of Encounter: 12/24/2017  Primary Cardiologist: Mariah MillingGollan  Subjective   No chest pain. SOB improved. Nausea improved. Weight down 2 pounds today. Net - 2.5 L for the admission. Remains on heparin gtt. HGB stable a 10.8. SCr stable at 0.69. Potassium at goal at 4.5.   Inpatient Medications    Scheduled Meds: . aspirin  324 mg Oral Once  . aspirin EC  81 mg Oral Daily  . atorvastatin  20 mg Oral Daily  . bisoprolol  2.5 mg Oral Daily  . calcium-vitamin D  1 tablet Oral Daily  . docusate sodium  100 mg Oral BID  . feeding supplement (ENSURE ENLIVE)  237 mL Oral TID BM  . finasteride  5 mg Oral Daily  . furosemide  20 mg Intravenous Daily  . lisinopril  2.5 mg Oral Daily  . methylPREDNISolone (SOLU-MEDROL) injection  40 mg Intravenous Daily  . multivitamin with minerals  1 tablet Oral Daily  . ondansetron (ZOFRAN) IV  4 mg Intravenous Q6H  . pantoprazole  40 mg Oral Daily  . pneumococcal 23 valent vaccine  0.5 mL Intramuscular Tomorrow-1000  . potassium chloride SA  20 mEq Oral Daily  . cyanocobalamin  100 mcg Oral Daily   Continuous Infusions: . heparin 1,100 Units/hr (12/24/17 0450)   PRN Meds: acetaminophen **OR** acetaminophen, bisacodyl, chlorpheniramine-HYDROcodone, HYDROcodone-acetaminophen, promethazine, traZODone   Vital Signs    Vitals:   12/24/17 0134 12/24/17 0432 12/24/17 0745 12/24/17 0750  BP:  (!) 94/54 111/72 103/68  Pulse:  70 71 69  Resp:  16 18 17   Temp:  98.3 F (36.8 C)  98.6 F (37 C)  TempSrc:      SpO2:  99% 99% 98%  Weight: 156 lb (70.8 kg)     Height:        Intake/Output Summary (Last 24 hours) at 12/24/2017 0840 Last data filed at 12/24/2017 0100 Gross per 24 hour  Intake 280 ml  Output 1345 ml  Net -1065 ml   Filed Weights   12/23/17 0300 12/23/17 1400 12/24/17 0134  Weight: 155 lb 3.2 oz (70.4 kg) 158 lb 8 oz (71.9 kg) 156 lb (70.8 kg)    Telemetry    V-paced - Personally  Reviewed  ECG    n/a - Personally Reviewed  Physical Exam   GEN: No acute distress.   Neck: No JVD. Cardiac: RRR, no murmurs, rubs, or gallops.  Respiratory: Diffused bilateral rhonchi with diminished breath sounds.  GI: Soft, nontender, non-distended.   MS: No edema; No deformity. Neuro:  Alert and oriented x 3; Nonfocal.  Psych: Normal affect.  Labs    Chemistry Recent Labs  Lab 12/22/17 1918 12/23/17 0435 12/23/17 1551 12/24/17 0616  NA  --  135 134* 136  K  --  3.3* 4.2 4.5  CL  --  101 100* 102  CO2  --  25 24 23   GLUCOSE  --  125* 180* 147*  BUN  --  14 17 22*  CREATININE  --  0.77 0.74 0.69  CALCIUM  --  8.3* 8.5* 8.5*  PROT 6.5  --   --   --   ALBUMIN 2.6*  --   --   --   AST 58*  --   --   --   ALT 42  --   --   --   ALKPHOS 343*  --   --   --   BILITOT  0.5  --   --   --   GFRNONAA  --  >60 >60 >60  GFRAA  --  >60 >60 >60  ANIONGAP  --  9 10 11      Hematology Recent Labs  Lab 12/22/17 1914 12/23/17 0435 12/24/17 0113  WBC 12.8* 11.7* 12.3*  RBC 3.52* 3.60* 3.70*  HGB 10.8* 10.8* 10.9*  HCT 32.0* 32.2* 33.5*  MCV 90.8 89.5 90.6  MCH 30.6 30.0 29.4  MCHC 33.7 33.6 32.5  RDW 13.9 13.4 13.8  PLT 319 303 319    Cardiac Enzymes Recent Labs  Lab 12/22/17 1914 12/23/17 0435  TROPONINI 0.56* 0.59*   No results for input(s): TROPIPOC in the last 168 hours.   BNP Recent Labs  Lab 12/22/17 1914  BNP 2,117.0*     DDimer No results for input(s): DDIMER in the last 168 hours.   Radiology    Dg Chest 2 View  Result Date: 12/22/2017 IMPRESSION: Marked pulmonary fibrosis similar to prior exam. No new segmental consolidation or pulmonary edema. Cardiomegaly with sequential pacemaker in place. Aortic Atherosclerosis (ICD10-I70.0). Electronically Signed   By: Lacy Duverney M.D.   On: 12/22/2017 20:05    Cardiac Studies   TTE 12/23/2017: Study Conclusions  - Left ventricle: The cavity size was mildly dilated. Wall   thickness was increased  in a pattern of mild LVH. Systolic   function was moderately to severely reduced. The estimated   ejection fraction was in the range of 30% to 35%. There is severe   hypokinesis of the inferior, inferoseptal, and apical myocardium.   The study is not technically sufficient to allow evaluation of LV   diastolic function. - Aortic valve: Trileaflet; moderately thickened, mildly calcified   leaflets. Cusp separation was reduced. Though there is no   significant stenosis by velocity and mean gradient criteria,   degree of stenosis may be underestimated due to decreased LVEF. - Mitral valve: Calcified annulus. Mildly thickened leaflets .   There was mild to moderate regurgitation. - Left atrium: The atrium was moderately dilated. - Right ventricle: The cavity size was mildly dilated. Systolic   function was mildly reduced. - Right atrium: The atrium was mildly dilated. - Tricuspid valve: There was mild-moderate regurgitation. - Pulmonary arteries: Systolic pressure was mildly to moderately   increased, in the range of 40 mm Hg to 45 mm Hg.  Patient Profile     84 y.o. male with history of pulmonary fibrosis, diast dysfxn, PSVT, PAF on xarelto, HTN, HL, and CHB s/p PPM, who is being seen today for the evaluation of acute systolic CHF with elevated troponin at the request of Dr. Nemiah Commander.  Assessment & Plan    1. Acute systolic CHF with pulmonary hypertension: -Echo 2/6 with newly reduced EF as above -Continue to diuresis with IV Lasix 20 mg daily for today -Planning for [facility] 2/7 to evaluate his cardiomyopathy -Diltiazem stopped as below -Continue bisoprolol -Continue lisinopril  -If BP tolerates, consider adding spironolactone prior to discharge -Uncertain if his BP would tolerate transition from ACEi to [facility] given history of labile BP -CHF education -Daily weights with strict Is and Os -NPO at midnight -Risks and benefits of cardiac catheterization have been discussed with the  patient including risks of bleeding, bruising, infection, kidney damage, stroke, heart attack, and death. The patient understands these risks and is willing to proceed with the procedure. All questions have been answered and concerns listened to  2. Elevated troponin: -No chest pain -Continue  to cycle until peaks -Heparin gtt -Likely supply demand ischemia in the setting of volume overload with known CAD on CT chest -Given his strong family history of CAD, echo with WMA concerning for ischemia, and CT chest documenting coronary atherosclerosis/calcification there is a strong likelihood he has significant CAD -ASA  3. PAF: -V paced -Diltiazem stopped given his cardiomyopathy -Tolerating bisoprolol 2.5 mg daily -Heparin gtt in place of Xarelto (last dose of Xarelto in the evening of 2/5) -Status post LHC would resume Xarelto prior to discharge given CHADS2VASc of at least 5 (CHF, HTN age x 2, vascular disease) -Depending on LHC he may require triple therapy for 30 days  4. CHB s/p PPM: -Device appears to be functioning normally on tele -Followed by EP  5. Chronic respiratory failure with hypoxia secondary to pulmonary fibrosis: -Supplemental ocygen at 2 L via nasal cannula  6. Anemia: -Appears stable -Per IM -Will need to monitor closely if he does require triple therapy   7. Hyperglycemia: -On steroids -Check A1c  8. Leukocytosis: -No obvious signs of infection -Likely from steroids -Would consider tapering off steroids, defer to IM   For questions or updates, please contact [facility] Please consult [email or web address] for contact info under Cardiology/STEMI.    Signed, Eula Listen, PA-C [facility] Pager: [phone] 12/24/2017, 8:40 AM

## 2017-12-25 ENCOUNTER — Encounter: Payer: Self-pay | Admitting: Cardiovascular Disease

## 2017-12-25 ENCOUNTER — Encounter
Admission: EM | Disposition: A | Discharge status: Home Health | Payer: Self-pay | Source: Home / Self Care | Attending: Internal Medicine

## 2017-12-25 HISTORY — PX: CORONARY STENT INTERVENTION: CATH118234

## 2017-12-25 HISTORY — PX: LEFT HEART CATH AND CORONARY ANGIOGRAPHY: CATH118249

## 2017-12-25 LAB — TROPONIN I: TROPONIN I: 0.62 ng/mL — AB (ref <0.03)

## 2017-12-25 LAB — CBC
HEMATOCRIT: 33 % — AB (ref 40.0–52.0)
Hemoglobin: 10.9 g/dL — ABNORMAL LOW (ref 13.0–18.0)
MCH: 30 pg (ref 26.0–34.0)
MCHC: 33 g/dL (ref 32.0–36.0)
MCV: 90.9 fL (ref 80.0–100.0)
PLATELETS: 363 10*3/uL (ref 150–440)
RBC: 3.63 MIL/uL — ABNORMAL LOW (ref 4.40–5.90)
RDW: 13.8 % (ref 11.5–14.5)
WBC: 16.9 10*3/uL — ABNORMAL HIGH (ref 3.8–10.6)

## 2017-12-25 LAB — POCT ACTIVATED CLOTTING TIME
ACTIVATED CLOTTING TIME: 247 s
Activated Clotting Time: 263 seconds

## 2017-12-25 LAB — GLUCOSE, CAPILLARY: Glucose-Capillary: 176 mg/dL — ABNORMAL HIGH (ref 65–99)

## 2017-12-25 LAB — HEPARIN LEVEL (UNFRACTIONATED): HEPARIN UNFRACTIONATED: 0.38 [IU]/mL (ref 0.30–0.70)

## 2017-12-25 LAB — CARDIAC CATHETERIZATION: Cath EF Quantitative: 25 %

## 2017-12-25 SURGERY — LEFT HEART CATH AND CORONARY ANGIOGRAPHY
Anesthesia: Moderate Sedation

## 2017-12-25 MED ORDER — ONDANSETRON HCL 4 MG/2ML IJ SOLN
4.0000 mg | Freq: Four times a day (QID) | INTRAMUSCULAR | Status: DC | PRN
  Administered 2017-12-25 (×2): 4 mg via INTRAVENOUS
  Filled 2017-12-25: qty 2

## 2017-12-25 MED ORDER — IOPAMIDOL (ISOVUE-300) INJECTION 61%
INTRAVENOUS | Status: DC | PRN
  Administered 2017-12-25: 155 mL via INTRAVENOUS

## 2017-12-25 MED ORDER — DIPHENHYDRAMINE HCL 50 MG/ML IJ SOLN
25.0000 mg | Freq: Once | INTRAMUSCULAR | Status: AC
  Administered 2017-12-25: 25 mg via INTRAVENOUS

## 2017-12-25 MED ORDER — FENTANYL CITRATE (PF) 100 MCG/2ML IJ SOLN
25.0000 ug | Freq: Once | INTRAMUSCULAR | Status: AC
  Administered 2017-12-25: 25 ug via INTRAVENOUS

## 2017-12-25 MED ORDER — SODIUM CHLORIDE 0.9% FLUSH
3.0000 mL | Freq: Two times a day (BID) | INTRAVENOUS | Status: DC
  Administered 2017-12-25 – 2017-12-27 (×5): 3 mL via INTRAVENOUS

## 2017-12-25 MED ORDER — MIDAZOLAM HCL 2 MG/2ML IJ SOLN
INTRAMUSCULAR | Status: DC | PRN
  Administered 2017-12-25: 1 mg via INTRAVENOUS

## 2017-12-25 MED ORDER — METHYLPREDNISOLONE SODIUM SUCC 125 MG IJ SOLR
125.0000 mg | Freq: Once | INTRAMUSCULAR | Status: AC
  Administered 2017-12-25: 125 mg via INTRAVENOUS

## 2017-12-25 MED ORDER — MIDAZOLAM HCL 2 MG/2ML IJ SOLN
INTRAMUSCULAR | Status: AC
  Filled 2017-12-25: qty 2

## 2017-12-25 MED ORDER — METHYLPREDNISOLONE SODIUM SUCC 125 MG IJ SOLR
INTRAMUSCULAR | Status: AC
  Administered 2017-12-25: 125 mg via INTRAVENOUS
  Filled 2017-12-25: qty 2

## 2017-12-25 MED ORDER — HEPARIN (PORCINE) IN NACL 2-0.9 UNIT/ML-% IJ SOLN
INTRAMUSCULAR | Status: AC
  Filled 2017-12-25: qty 1000

## 2017-12-25 MED ORDER — FENTANYL CITRATE (PF) 100 MCG/2ML IJ SOLN
INTRAMUSCULAR | Status: AC
  Filled 2017-12-25: qty 2

## 2017-12-25 MED ORDER — SODIUM CHLORIDE 0.9 % IV SOLN: 250.0000 mL | INTRAVENOUS | Status: DC | PRN

## 2017-12-25 MED ORDER — SODIUM CHLORIDE 0.9 % IV SOLN: INTRAVENOUS | Status: AC

## 2017-12-25 MED ORDER — VERAPAMIL HCL 2.5 MG/ML IV SOLN
INTRAVENOUS | Status: DC | PRN
  Administered 2017-12-25: 2.5 mg via INTRAVENOUS

## 2017-12-25 MED ORDER — ASPIRIN 81 MG PO CHEW
CHEWABLE_TABLET | ORAL | Status: DC | PRN
  Administered 2017-12-25: 324 mg via ORAL

## 2017-12-25 MED ORDER — FENTANYL CITRATE (PF) 100 MCG/2ML IJ SOLN
INTRAMUSCULAR | Status: DC | PRN
  Administered 2017-12-25: 25 ug via INTRAVENOUS

## 2017-12-25 MED ORDER — CLOPIDOGREL BISULFATE 75 MG PO TABS
ORAL_TABLET | ORAL | Status: DC | PRN
  Administered 2017-12-25: 600 mg via ORAL

## 2017-12-25 MED ORDER — NON FORMULARY: 5.0000 mg | Freq: Every evening | Status: DC | PRN

## 2017-12-25 MED ORDER — DIPHENHYDRAMINE HCL 50 MG/ML IJ SOLN
INTRAMUSCULAR | Status: AC
  Administered 2017-12-25: 25 mg via INTRAVENOUS
  Filled 2017-12-25: qty 1

## 2017-12-25 MED ORDER — ASPIRIN 81 MG PO CHEW
CHEWABLE_TABLET | ORAL | Status: AC
  Filled 2017-12-25: qty 4

## 2017-12-25 MED ORDER — HYDROCODONE-ACETAMINOPHEN 5-325 MG PO TABS
ORAL_TABLET | ORAL | Status: AC
  Filled 2017-12-25: qty 1

## 2017-12-25 MED ORDER — ENOXAPARIN SODIUM 40 MG/0.4ML ~~LOC~~ SOLN
40.0000 mg | SUBCUTANEOUS | Status: DC
  Administered 2017-12-26 – 2017-12-27 (×2): 40 mg via SUBCUTANEOUS
  Filled 2017-12-25 (×2): qty 0.4

## 2017-12-25 MED ORDER — METOCLOPRAMIDE HCL 5 MG/ML IJ SOLN: 5.0000 mg | Freq: Four times a day (QID) | INTRAMUSCULAR | Status: DC | PRN

## 2017-12-25 MED ORDER — TRAMADOL HCL 50 MG PO TABS
50.0000 mg | ORAL_TABLET | Freq: Four times a day (QID) | ORAL | Status: DC | PRN
  Administered 2017-12-25: 50 mg via ORAL
  Filled 2017-12-25: qty 1

## 2017-12-25 MED ORDER — FUROSEMIDE 40 MG PO TABS
40.0000 mg | ORAL_TABLET | Freq: Every day | ORAL | Status: DC
  Administered 2017-12-25 – 2017-12-26 (×2): 40 mg via ORAL
  Filled 2017-12-25 (×3): qty 1

## 2017-12-25 MED ORDER — PROMETHAZINE HCL 25 MG/ML IJ SOLN
INTRAMUSCULAR | Status: AC
  Filled 2017-12-25: qty 1

## 2017-12-25 MED ORDER — FAMOTIDINE 20 MG PO TABS
ORAL_TABLET | ORAL | Status: AC
  Filled 2017-12-25: qty 2

## 2017-12-25 MED ORDER — HEPARIN SODIUM (PORCINE) 1000 UNIT/ML IJ SOLN
INTRAMUSCULAR | Status: DC | PRN
  Administered 2017-12-25: 1000 [IU] via INTRAVENOUS
  Administered 2017-12-25: 3500 [IU] via INTRAVENOUS
  Administered 2017-12-25: 4000 [IU] via INTRAVENOUS

## 2017-12-25 MED ORDER — CLOPIDOGREL BISULFATE 75 MG PO TABS
75.0000 mg | ORAL_TABLET | Freq: Every day | ORAL | Status: DC
  Administered 2017-12-26 – 2017-12-27 (×2): 75 mg via ORAL
  Filled 2017-12-25 (×2): qty 1

## 2017-12-25 MED ORDER — CLOPIDOGREL BISULFATE 300 MG PO TABS
ORAL_TABLET | ORAL | Status: AC
  Filled 2017-12-25: qty 2

## 2017-12-25 MED ORDER — ONDANSETRON HCL 4 MG/2ML IJ SOLN
INTRAMUSCULAR | Status: AC
  Filled 2017-12-25: qty 2

## 2017-12-25 MED ORDER — VERAPAMIL HCL 2.5 MG/ML IV SOLN
INTRAVENOUS | Status: AC
  Filled 2017-12-25: qty 2

## 2017-12-25 MED ORDER — NITROGLYCERIN 5 MG/ML IV SOLN
INTRAVENOUS | Status: AC
Start: 2017-12-25 — End: 2017-12-25
  Filled 2017-12-25: qty 10

## 2017-12-25 MED ORDER — MELATONIN 5 MG PO TABS
5.0000 mg | ORAL_TABLET | Freq: Every evening | ORAL | Status: DC | PRN
  Administered 2017-12-26: 5 mg via ORAL
  Filled 2017-12-25 (×2): qty 1

## 2017-12-25 MED ORDER — MORPHINE SULFATE (PF) 2 MG/ML IV SOLN: 1.0000 mg | Freq: Once | INTRAVENOUS | Status: DC

## 2017-12-25 MED ORDER — SODIUM CHLORIDE 0.9% FLUSH: 3.0000 mL | INTRAVENOUS | Status: DC | PRN

## 2017-12-25 MED ORDER — HEPARIN SODIUM (PORCINE) 1000 UNIT/ML IJ SOLN
INTRAMUSCULAR | Status: AC
  Filled 2017-12-25: qty 1

## 2017-12-25 MED ORDER — LORAZEPAM 2 MG/ML IJ SOLN
0.5000 mg | Freq: Once | INTRAMUSCULAR | Status: AC | PRN
  Administered 2017-12-25: 0.5 mg via INTRAVENOUS
  Filled 2017-12-25: qty 1

## 2017-12-25 MED ORDER — ACETAMINOPHEN 325 MG PO TABS
ORAL_TABLET | ORAL | Status: AC
  Filled 2017-12-25: qty 2

## 2017-12-25 SURGICAL SUPPLY — 20 items
BALLN TREK RX 2.5X12 (BALLOONS) ×2
BALLN TREK RX 2.5X20 (BALLOONS) ×2
BALLN ~~LOC~~ EUPHORA RX 3.25X20 (BALLOONS) ×2
BALLOON TREK RX 2.5X12 (BALLOONS) ×1 IMPLANT
BALLOON TREK RX 2.5X20 (BALLOONS) ×1 IMPLANT
BALLOON ~~LOC~~ EUPHORA RX 3.25X20 (BALLOONS) ×1 IMPLANT
CATH INFINITI 5 FR JL3.5 (CATHETERS) ×2 IMPLANT
CATH OPTITORQUE JACKY 4.0 5F (CATHETERS) ×2 IMPLANT
CATH VISTA GUIDE 6FR JR4 SH (CATHETERS) ×2 IMPLANT
DEVICE INFLAT 30 PLUS (MISCELLANEOUS) ×2 IMPLANT
DEVICE RAD TR BAND REGULAR (VASCULAR PRODUCTS) ×2 IMPLANT
GLIDESHEATH SLEND SS 6F .021 (SHEATH) ×2 IMPLANT
KIT MANI 3VAL PERCEP (MISCELLANEOUS) ×2 IMPLANT
PACK CARDIAC CATH (CUSTOM PROCEDURE TRAY) ×2 IMPLANT
STENT SIERRA 3.00 X 33 MM (Permanent Stent) ×2 IMPLANT
STENT SIERRA 3.25 X 18 MM (Permanent Stent) ×2 IMPLANT
WIRE HITORQ VERSACORE ST 145CM (WIRE) ×2 IMPLANT
WIRE INTUITION PROPEL ST 180CM (WIRE) ×2 IMPLANT
WIRE ROSEN-J .035X260CM (WIRE) ×2 IMPLANT
WIRE RUNTHROUGH .014X180CM (WIRE) ×2 IMPLANT

## 2017-12-25 NOTE — Interval H&P Note (Signed)
Cath Lab Visit (complete for each Cath Lab visit)  Clinical Evaluation Leading to the Procedure:   ACS: Yes.   NSTEMI  Non-ACS:  n/a   History and Physical Interval Note:  12/25/2017 8:06 AM  Walter Townsend  has presented today for surgery, with the diagnosis of acute systolic CHF.  To be performed at 07:30  The various methods of treatment have been discussed with the patient and family. After consideration of risks, benefits and other options for treatment, the patient has consented to  Procedure(s): LEFT HEART CATH AND CORONARY ANGIOGRAPHY (N/A) as a surgical intervention .  The patient's history has been reviewed, patient examined, no change in status, stable for surgery.  I have reviewed the patient's chart and labs.  Questions were answered to the patient's satisfaction.     Lorine BearsMuhammad Cherrell Maybee

## 2017-12-25 NOTE — Progress Notes (Signed)
Talked to Dr. Allena KatzPatel about patient's complaints of his worst neck pain radiating in his left shoulder and his chest, order for one time dose of 1 mg IV Morphine. Asked if patient needs stat EKG, per MD paged cardiologist. No other concern at the moment. RN will continue to monitor.

## 2017-12-25 NOTE — Care Management Important Message (Signed)
Important Message  Patient Details  Name: Walter PickettWillard L Catena MRN: [id number] Date of Birth: December 16, 1924   Medicare Important Message Given:  Yes  Signed IM notice given   Eber HongGreene, Glenard Keesling R, RN 12/25/2017, 5:55 PM

## 2017-12-25 NOTE — Progress Notes (Signed)
Pt report right sided CP, worse with deep inspiration, repeat EKG performed and Dr. Kirke CorinArida paged, VORB for 25mcg fentanyl IV given by this nurse.  Pt repositioned for comfort.  Pt in NAD at this time.

## 2017-12-25 NOTE — Care Management (Signed)
Patient from home and has chronic home 02 through [facility].  If requires home health, agency preference would be [facility]. my benefit form RN

## 2017-12-25 NOTE — Progress Notes (Signed)
Talked to Dr. Kirke CorinArida about patient's complaints of neck pain radiating in his left shoulder through his chest, order for Stat EKG and Troponin. And per MD he will see the patient. No other concern at the moment. RN will continue to monitor.

## 2017-12-25 NOTE — Progress Notes (Signed)
Pt c/o continued discomfort but in NAD, pt assisted to change position numerous times, pt repeatedly wanting to turn onto right side, pt and family instructed to avoid this to prevent pressure to puncture site on right arm, family verbalizes understanding and pt redirected.  Despite repositioning, medication, redirection, pt continues to c/o discomfort.  Family verbalizes understanding that pt has received multiple pain medications and nausea medications.

## 2017-12-25 NOTE — Progress Notes (Signed)
Pt c/o ongoing SOB, crackles heard upon auscultation, pharmacy called to expedite receiving lasix. Oxygen increased to 3L for comfort.  Pt coached to slow respiration rate.

## 2017-12-25 NOTE — Progress Notes (Signed)
Late Entry: Talked to Dr. Kirke CorinArida if patient can have PRN Melatonin to help patient sleep, order given. RN will continue to monitor.

## 2017-12-25 NOTE — Progress Notes (Signed)
Talked to Dr. Allena KatzPatel about patient's complaints of neck pain order given for tramadol and to discontinue Norco. No other concern at the moment. RN will continue to monitor.

## 2017-12-25 NOTE — Progress Notes (Signed)
[facility] [place] [facility]   PATIENT NAME: Walter Townsend    MR#:  [id number]  DATE OF BIRTH:  1925/04/30  SUBJECTIVE:   Post cath having lot of nausea and neck pain that was present prior to getting cath REVIEW OF SYSTEMS:   Review of Systems  Constitutional: Negative for chills, fever and weight loss.  HENT: Negative for ear discharge, ear pain and nosebleeds.   Eyes: Negative for blurred vision, pain and discharge.  Respiratory: Negative for sputum production, shortness of breath, wheezing and stridor.   Cardiovascular: Negative for chest pain, palpitations, orthopnea and PND.  Gastrointestinal: Negative for abdominal pain, diarrhea, nausea and vomiting.  Genitourinary: Negative for frequency and urgency.  Musculoskeletal: Negative for back pain and joint pain.  Neurological: Positive for weakness. Negative for sensory change, speech change and focal weakness.  Psychiatric/Behavioral: Negative for depression and hallucinations. The patient is not nervous/anxious.    Tolerating Diet: Yes Tolerating PT: Ambulatory  DRUG ALLERGIES:   Allergies  Allergen Reactions  . Iodinated Diagnostic Agents Rash    PRE-MED WITH PREDNISONE    VITALS:  Blood pressure 119/77, pulse 84, temperature 98.7 F (37.1 C), temperature source Oral, resp. rate 18, height 5' 8.5" (1.74 m), weight 71.2 kg (157 lb), SpO2 98 %.  PHYSICAL EXAMINATION:   Physical Exam  GENERAL:  84 y.o.-year-old patient lying in the bed with no acute distress.  EYES: Pupils equal, round, reactive to light and accommodation. No scleral icterus. Extraocular muscles intact.  HEENT: Head atraumatic, normocephalic. Oropharynx and nasopharynx clear.  NECK:  Supple, no jugular venous distention. No thyroid enlargement, no tenderness.  LUNGS: Normal breath sounds bilaterally, no wheezing, rales, rhonchi. No use of accessory muscles of respiration.  CARDIOVASCULAR: S1, S2 normal. No murmurs,  rubs, or gallops.  ABDOMEN: Soft, nontender, nondistended. Bowel sounds present. No organomegaly or mass.  EXTREMITIES: No cyanosis, clubbing or edema b/l.    NEUROLOGIC: Cranial nerves II through XII are intact. No focal Motor or sensory deficits b/l.   PSYCHIATRIC:  patient is alert and oriented x 3.  SKIN: No obvious rash, lesion, or ulcer.   LABORATORY PANEL:  CBC Recent Labs  Lab 12/25/17 0641  WBC 16.9*  HGB 10.9*  HCT 33.0*  PLT 363    Chemistries  Recent Labs  Lab 12/22/17 1918  12/24/17 0616 12/24/17 0859  NA  --    < > 136  --   K  --    < > 4.5  --   CL  --    < > 102  --   CO2  --    < > 23  --   GLUCOSE  --    < > 147*  --   BUN  --    < > 22*  --   CREATININE  --    < > 0.69  --   CALCIUM  --    < > 8.5*  --   MG  --   --   --  1.9  AST 58*  --   --   --   ALT 42  --   --   --   ALKPHOS 343*  --   --   --   BILITOT 0.5  --   --   --    < > = values in this interval not displayed.   Cardiac Enzymes Recent Labs  Lab 12/25/17 1659  TROPONINI 0.62*   RADIOLOGY:  No  results found. ASSESSMENT AND PLAN:  84 year old male with past medical history significant for pulmonary fibrosis on chronic 2 L home oxygen, paroxysmal atrial fibrillation on anticoagulation, hypertension, hyperlipidemia presents to the hospital secondary to worsening shortness of breath and chest pain  1. Acute on chronic respiratory failure-secondary to pulmonary fibrosis -Also fluid retention, likely diastolic dysfunction. -Received 1 dose of Lasix. Now on 2 L oxygen which uses chronic oxygen -Received IV  steroids.  Patient currently is breathing comfortably.  Does not seem to have pulmonary fibrosis flare at present.  Will stop steroids.  - IV Lasix 20 mg daily -Pulmonary consult appreciated -cardiology consult appreciated.  Currently he is on bisoprolol 2.5 mg daily, as needed lisinopril 2.5 mg. -Patient's Xarelto is on hold -  Patient is s.p cardiac cath that showed 1.   Significant underlying three-vessel coronary artery disease but the culprit is severe ostial RCA disease.   The coronary arteries are overall severely calcified. 2.  Severely reduced LV systolic function with an EF of 25%.  Moderately elevated left ventricular end-diastolic pressure.  Severe mitral annular calcifications 3.  Successful complex angioplasty and drug-eluting stent placement to the mid as well as ostial right coronary artery.    2. History of tachycardia palpitations, atrial fibrillation-status post pacemaker for AV block. -On Cardizem for rate control -Has history of chronic atypical chest pain -resume Xarelto for anticoagulation.  3. Elevated troponin-likely demand ischemia from respiratory distress on presentation vs rule out underlying coronary artery disease given wall motion findings on prior CTA and echo -Cardiology consult appreciated -w/u as above   4. Hypokalemia-being replaced  5. DVT prophylaxis-on heparin drip  6.  Chronic anemia -Hemoglobin stable at 10.  No history of active bleeding at present  7. Chronic nausea -follows with Dr Markham Jordan -prn zofran and reglan. outpt w/u for nausea so far negative  D/w dter-in-law   Case discussed with Care Management/Social Worker. Management plans discussed with the patient, family and they are in agreement.  CODE STATUS: Full  DVT Prophylaxis: Heparin  TOTAL TIME TAKING CARE OF THIS PATIENT: 30 minutes.  >50% time spent on counselling and coordination of care  POSSIBLE D/C IN 1-2 DAYS, DEPENDING ON CLINICAL CONDITION.  Note: This dictation was prepared with Dragon dictation along with smaller phrase technology. Any transcriptional errors that result from this process are unintentional.  Enedina Finner M.D on 12/25/2017 at 6:37 PM  Between 7am to 6pm - Pager - [phone]  After 6pm go to [email or web address] - password [email or web address]  [facility]  Office  [phone]  CC: Primary care physician;  Rafael Bihari, MDPatient ID: Walter Townsend, male   DOB: Jun 20, 1925, 84 y.o.   MRN: [id number]

## 2017-12-26 DIAGNOSIS — R0602 Shortness of breath: Secondary | ICD-10-CM

## 2017-12-26 DIAGNOSIS — I255 Ischemic cardiomyopathy: Secondary | ICD-10-CM

## 2017-12-26 LAB — BASIC METABOLIC PANEL
Anion gap: 12 (ref 5–15)
BUN: 30 mg/dL — ABNORMAL HIGH (ref 6–20)
CALCIUM: 8.6 mg/dL — AB (ref 8.9–10.3)
CO2: 22 mmol/L (ref 22–32)
CREATININE: 0.88 mg/dL (ref 0.61–1.24)
Chloride: 100 mmol/L — ABNORMAL LOW (ref 101–111)
GFR calc Af Amer: >60 mL/min (ref >60)
GFR calc non Af Amer: >60 mL/min (ref >60)
GLUCOSE: 112 mg/dL — AB (ref 65–99)
Potassium: 5.3 mmol/L — ABNORMAL HIGH (ref 3.5–5.1)
Sodium: 134 mmol/L — ABNORMAL LOW (ref 135–145)

## 2017-12-26 LAB — URINALYSIS, COMPLETE (UACMP) WITH MICROSCOPIC
BACTERIA UA: NONE SEEN
Bilirubin Urine: NEGATIVE
GLUCOSE, UA: NEGATIVE mg/dL
HGB URINE DIPSTICK: NEGATIVE
Ketones, ur: NEGATIVE mg/dL
Leukocytes, UA: NEGATIVE
NITRITE: NEGATIVE
PH: 6 (ref 5.0–8.0)
Protein, ur: NEGATIVE mg/dL
SPECIFIC GRAVITY, URINE: 1.019 (ref 1.005–1.030)
Squamous Epithelial / LPF: NONE SEEN

## 2017-12-26 LAB — CBC
HEMATOCRIT: 33.2 % — AB (ref 40.0–52.0)
Hemoglobin: 10.9 g/dL — ABNORMAL LOW (ref 13.0–18.0)
MCH: 30.1 pg (ref 26.0–34.0)
MCHC: 32.9 g/dL (ref 32.0–36.0)
MCV: 91.5 fL (ref 80.0–100.0)
Platelets: 359 10*3/uL (ref 150–440)
RBC: 3.62 MIL/uL — ABNORMAL LOW (ref 4.40–5.90)
RDW: 14.1 % (ref 11.5–14.5)
WBC: 18.5 10*3/uL — ABNORMAL HIGH (ref 3.8–10.6)

## 2017-12-26 LAB — GLUCOSE, CAPILLARY: GLUCOSE-CAPILLARY: 109 mg/dL — AB (ref 65–99)

## 2017-12-26 NOTE — Progress Notes (Signed)
Progress Note  Patient Name: Walter PickettWillard L Whipp Date of Encounter: 12/26/2017  Primary Cardiologist: Mariah MillingGollan  Subjective   Reports having severe back pain last night, took hours until it eventually resolved and he was able to for sleep, thinks it may have been from laying on the cardiac catheterization table Pain on deep inspiration in the chest Previous pain under left ribs, left upper quadrant of his abdomen gone this morning ate a good breakfast,  Feels weak, wonders if he might need rehab Nervous to go home, lives by himself Numerous family members by the bedside Feels his breathing improved but has not been out of bed except sitting on the side of the bed to eat  Inpatient Medications    Scheduled Meds: . aspirin  324 mg Oral Once  . aspirin EC  81 mg Oral Daily  . atorvastatin  20 mg Oral Daily  . bisoprolol  2.5 mg Oral Daily  . calcium-vitamin D  1 tablet Oral Daily  . clopidogrel  75 mg Oral Q breakfast  . docusate sodium  100 mg Oral BID  . enoxaparin (LOVENOX) injection  40 mg Subcutaneous Q24H  . feeding supplement (ENSURE ENLIVE)  237 mL Oral TID BM  . finasteride  5 mg Oral Daily  . furosemide  40 mg Oral Daily  . lisinopril  2.5 mg Oral Daily  .  morphine injection  1 mg Intravenous Once  . multivitamin with minerals  1 tablet Oral Daily  . pantoprazole  40 mg Oral Daily  . pneumococcal 23 valent vaccine  0.5 mL Intramuscular Tomorrow-1000  . potassium chloride SA  20 mEq Oral Daily  . sodium chloride flush  3 mL Intravenous Q12H  . cyanocobalamin  100 mcg Oral Daily   Continuous Infusions: . sodium chloride     PRN Meds: sodium chloride, acetaminophen **OR** acetaminophen, bisacodyl, chlorpheniramine-HYDROcodone, Melatonin, metoCLOPramide (REGLAN) injection, ondansetron (ZOFRAN) IV, sodium chloride flush, traMADol, traZODone   Vital Signs    Vitals:   12/25/17 1623 12/25/17 2103 12/26/17 0524 12/26/17 0819  BP: 119/77 103/68 95/62 121/79  Pulse: 84  71 67 78  Resp:  17 17 14   Temp:  97.6 F (36.4 C) (!) 97.3 F (36.3 C)   TempSrc:  Axillary Oral   SpO2: 98% 97% 97% 97%  Weight:   158 lb 4.8 oz (71.8 kg)   Height:        Intake/Output Summary (Last 24 hours) at 12/26/2017 1431 Last data filed at 12/26/2017 0814 Gross per 24 hour  Intake -  Output 1000 ml  Net -1000 ml   Filed Weights   12/25/17 0408 12/25/17 0714 12/26/17 0524  Weight: 157 lb 4.8 oz (71.4 kg) 157 lb (71.2 kg) 158 lb 4.8 oz (71.8 kg)    Telemetry    V-paced -no significant arrhythmia,  personally Reviewed  ECG    n/a - Personally Reviewed  Physical Exam   GEN: No acute distress.  Frail, laying supine in bed Neck: No JVD. Cardiac: RRR, no murmurs, rubs, or gallops.  Respiratory: Diffuse bilateral rhonchi particularly at the bases halfway up bilaterally with diminished breath sounds.  GI: Soft, nontender, non-distended.   MS: No edema; No deformity. Neuro:  Alert and oriented x 3; Nonfocal.  Psych: Normal affect.  Labs    Chemistry Recent Labs  Lab 12/22/17 1918  12/23/17 1551 12/24/17 0616 12/26/17 0545  NA  --    < > 134* 136 134*  K  --    < >  4.2 4.5 5.3*  CL  --    < > 100* 102 100*  CO2  --    < > 24 23 22   GLUCOSE  --    < > 180* 147* 112*  BUN  --    < > 17 22* 30*  CREATININE  --    < > 0.74 0.69 0.88  CALCIUM  --    < > 8.5* 8.5* 8.6*  PROT 6.5  --   --   --   --   ALBUMIN 2.6*  --   --   --   --   AST 58*  --   --   --   --   ALT 42  --   --   --   --   ALKPHOS 343*  --   --   --   --   BILITOT 0.5  --   --   --   --   GFRNONAA  --    < > >60 >60 >60  GFRAA  --    < > >60 >60 >60  ANIONGAP  --    < > 10 11 12    < > = values in this interval not displayed.     Hematology Recent Labs  Lab 12/24/17 0113 12/25/17 0641 12/26/17 0545  WBC 12.3* 16.9* 18.5*  RBC 3.70* 3.63* 3.62*  HGB 10.9* 10.9* 10.9*  HCT 33.5* 33.0* 33.2*  MCV 90.6 90.9 91.5  MCH 29.4 30.0 30.1  MCHC 32.5 33.0 32.9  RDW 13.8 13.8 14.1  PLT 319  363 359    Cardiac Enzymes Recent Labs  Lab 12/22/17 1914 12/23/17 0435 12/24/17 0859 12/25/17 1659  TROPONINI 0.56* 0.59* 0.58* 0.62*   No results for input(s): TROPIPOC in the last 168 hours.   BNP Recent Labs  Lab 12/22/17 1914  BNP 2,117.0*     DDimer No results for input(s): DDIMER in the last 168 hours.   Radiology    Dg Chest 2 View  Result Date: 12/22/2017 IMPRESSION: Marked pulmonary fibrosis similar to prior exam. No new segmental consolidation or pulmonary edema. Cardiomegaly with sequential pacemaker in place. Aortic Atherosclerosis (ICD10-I70.0). Electronically Signed   By: Lacy Duverney M.D.   On: 12/22/2017 20:05    Cardiac Studies   TTE 12/23/2017: Study Conclusions  - Left ventricle: The cavity size was mildly dilated. Wall   thickness was increased in a pattern of mild LVH. Systolic   function was moderately to severely reduced. The estimated   ejection fraction was in the range of 30% to 35%. There is severe   hypokinesis of the inferior, inferoseptal, and apical myocardium.   The study is not technically sufficient to allow evaluation of LV   diastolic function. - Aortic valve: Trileaflet; moderately thickened, mildly calcified   leaflets. Cusp separation was reduced. Though there is no   significant stenosis by velocity and mean gradient criteria,   degree of stenosis may be underestimated due to decreased LVEF. - Mitral valve: Calcified annulus. Mildly thickened leaflets .   There was mild to moderate regurgitation. - Left atrium: The atrium was moderately dilated. - Right ventricle: The cavity size was mildly dilated. Systolic   function was mildly reduced. - Right atrium: The atrium was mildly dilated. - Tricuspid valve: There was mild-moderate regurgitation. - Pulmonary arteries: Systolic pressure was mildly to moderately   increased, in the range of 40 mm Hg to 45 mm Hg.  Patient Profile  84 y.o. male with history of pulmonary  fibrosis, diast dysfxn, PSVT, PAF on xarelto, HTN, HL, and CHB s/p PPM, who is being seen today for the evaluation of acute systolic CHF with elevated troponin at the request of Dr. Nemiah Commander.  Assessment & Plan    1. Acute systolic CHF with pulmonary hypertension: -Echo 2/6 with newly reduced EF as above High fluid intake at home, Long discussion concerning need to moderate his fluids in the latter part of the afternoon evening -Continue bisoprolol -Continue lisinopril  spironolactone prior to discharge As an outpatient we could change him to [facility]  but will be difficult given severe chronic hypotension in the morning (he has inappropriate diuresis overnight)  2. CAD Severe RCA stenosis, stent x2 placed yesterday Difficult procedure, heavily calcified Aspirin and Plavix for 1 month after 30 days as an outpatient we will discontinue aspirin and restart Xarelto Atypical chest pain this morning on deep inspiration  3. PAF: -V paced -Tolerating bisoprolol   CHADS2VASc of at least 5 (CHF, HTN age x 2, vascular disease) We will restart Xarelto in 30 days  4. CHB s/p PPM:  functioning normally on tele  5. Chronic respiratory failure with hypoxia secondary to pulmonary fibrosis: -Supplemental ocygen at 2 L via nasal cannula Slow progression of his disease over the past several years Supportive treatments, followed by pulmonary  6. Anemia: -Appears stable  7. Hyperglycemia: -On steroids  8. Leukocytosis: -No obvious signs of infection, likely from steroids   Total encounter time more than 35 minutes  Greater than 50% was spent in counseling and coordination of care with the patient   For questions or updates, please contact [facility] Please consult [email or web address] for contact info under Cardiology/STEMI.    Signed, Eula Listen, PA-C [facility] Pager: [phone] 12/26/2017, 2:31 PM

## 2017-12-26 NOTE — Progress Notes (Signed)
[facility] [place] [facility]   PATIENT NAME: Walter Townsend    MR#:  [id number]  DATE OF BIRTH:  15-Feb-1925  SUBJECTIVE:   Patient more perky and doing as well as feeling much better today.  He ate good breakfast. REVIEW OF SYSTEMS:   Review of Systems  Constitutional: Negative for chills, fever and weight loss.  HENT: Negative for ear discharge, ear pain and nosebleeds.   Eyes: Negative for blurred vision, pain and discharge.  Respiratory: Negative for sputum production, shortness of breath, wheezing and stridor.   Cardiovascular: Negative for chest pain, palpitations, orthopnea and PND.  Gastrointestinal: Negative for abdominal pain, diarrhea, nausea and vomiting.  Genitourinary: Negative for frequency and urgency.  Musculoskeletal: Negative for back pain and joint pain.  Neurological: Positive for weakness. Negative for sensory change, speech change and focal weakness.  Psychiatric/Behavioral: Negative for depression and hallucinations. The patient is not nervous/anxious.    Tolerating Diet: Yes Tolerating PT: Ambulatory  DRUG ALLERGIES:   Allergies  Allergen Reactions  . Iodinated Diagnostic Agents Rash    PRE-MED WITH PREDNISONE    VITALS:  Blood pressure 121/79, pulse 78, temperature (!) 97.3 F (36.3 C), temperature source Oral, resp. rate 14, height 5' 8.5" (1.74 m), weight 71.8 kg (158 lb 4.8 oz), SpO2 97 %.  PHYSICAL EXAMINATION:   Physical Exam  GENERAL:  84 y.o.-year-old patient lying in the bed with no acute distress.  EYES: Pupils equal, round, reactive to light and accommodation. No scleral icterus. Extraocular muscles intact.  HEENT: Head atraumatic, normocephalic. Oropharynx and nasopharynx clear.  NECK:  Supple, no jugular venous distention. No thyroid enlargement, no tenderness.  LUNGS: Normal breath sounds bilaterally, no wheezing, rales, rhonchi. No use of accessory muscles of respiration.  CARDIOVASCULAR: S1, S2  normal. No murmurs, rubs, or gallops.  ABDOMEN: Soft, nontender, nondistended. Bowel sounds present. No organomegaly or mass.  EXTREMITIES: No cyanosis, clubbing or edema b/l.    NEUROLOGIC: Cranial nerves II through XII are intact. No focal Motor or sensory deficits b/l.   PSYCHIATRIC:  patient is alert and oriented x 3.  SKIN: No obvious rash, lesion, or ulcer.   LABORATORY PANEL:  CBC Recent Labs  Lab 12/26/17 0545  WBC 18.5*  HGB 10.9*  HCT 33.2*  PLT 359    Chemistries  Recent Labs  Lab 12/22/17 1918  12/24/17 0859 12/26/17 0545  NA  --    < >  --  134*  K  --    < >  --  5.3*  CL  --    < >  --  100*  CO2  --    < >  --  22  GLUCOSE  --    < >  --  112*  BUN  --    < >  --  30*  CREATININE  --    < >  --  0.88  CALCIUM  --    < >  --  8.6*  MG  --   --  1.9  --   AST 58*  --   --   --   ALT 42  --   --   --   ALKPHOS 343*  --   --   --   BILITOT 0.5  --   --   --    < > = values in this interval not displayed.   Cardiac Enzymes Recent Labs  Lab 12/25/17 1659  TROPONINI 0.62*  RADIOLOGY:  No results found. ASSESSMENT AND PLAN:  84 year old male with past medical history significant for pulmonary fibrosis on chronic 2 L home oxygen, paroxysmal atrial fibrillation on anticoagulation, hypertension, hyperlipidemia presents to the hospital secondary to worsening shortness of breath and chest pain  1. Acute on chronic respiratory failure-secondary to pulmonary fibrosis -Also fluid retention, likely diastolic dysfunction. -Received 1 dose of Lasix. Now on 2 L oxygen which uses chronic oxygen -Received IV  steroids.  Patient currently is breathing comfortably.  Does not seem to have pulmonary fibrosis flare at present.  Will stop steroids.  - IV Lasix 20 mg daily--- now on Lasix 40 mg daily -Pulmonary consult appreciated -cardiology consult appreciated.  Currently he is on bisoprolol 2.5 mg daily, as needed lisinopril 2.5 mg. -Patient's Xarelto is on hold--will  be started 1 month after-completing aspirin Plavix. -  Patient is s.p cardiac cath that showed Significant underlying three-vessel coronary artery disease but the culprit is severe ostial RCA disease.   The coronary arteries are overall severely calcified.   Severely reduced LV systolic function with an EF of 25%.  Moderately elevated left ventricular end-diastolic pressure.  Severe mitral annular calcifications   Successful complex angioplasty and drug-eluting stent placement to the mid as well as ostial right coronary artery.    2. History of tachycardia palpitations, atrial fibrillation-status post pacemaker for AV block. -On Cardizem for rate control -Has history of chronic atypical chest pain -Xarelto is on hold for 1 month since patient is now on aspirin Plavix per cardiology recommendation  3. Elevated troponin-likely demand ischemia from respiratory distress on presentation vs rule out underlying coronary artery disease given wall motion findings on prior CTA and echo -Cardiology consult appreciated -w/u as above   4. Hypokalemia-being replaced  5. DVT prophylaxis-on heparin drip  6.  Chronic anemia -Hemoglobin stable at 10.  No history of active bleeding at present  7. Chronic nausea -follows with Dr Markham Jordan -prn zofran and reglan. outpt w/u for nausea so far negative  D/w patient's son and Dr. Mariah Milling  Patient wants to go home tomorrow if he does well with physical therapy. Case discussed with Care Management/Social Worker. Management plans discussed with the patient, family and they are in agreement.  CODE STATUS: Full  DVT Prophylaxis: Lovenox  TOTAL TIME TAKING CARE OF THIS PATIENT: 30 minutes.  >50% time spent on counselling and coordination of care  POSSIBLE D/C IN 1-2 DAYS, DEPENDING ON CLINICAL CONDITION.  Note: This dictation was prepared with Dragon dictation along with smaller phrase technology. Any transcriptional errors that result from this process  are unintentional.  Enedina Finner M.D on 12/26/2017 at 3:41 PM  Between 7am to 6pm - Pager - [phone]  After 6pm go to [email or web address] - password [email or web address]  [facility]  Office  [phone]  CC: Primary care physician; Rafael Bihari, MDPatient ID: [name], male   DOB: Feb 12, 1925, 84 y.o.   MRN: [id number]

## 2017-12-27 DIAGNOSIS — F419 Anxiety disorder, unspecified: Secondary | ICD-10-CM

## 2017-12-27 DIAGNOSIS — J849 Interstitial pulmonary disease, unspecified: Secondary | ICD-10-CM

## 2017-12-27 LAB — POTASSIUM: Potassium: 4.5 mmol/L (ref 3.5–5.1)

## 2017-12-27 LAB — GLUCOSE, CAPILLARY: Glucose-Capillary: 99 mg/dL (ref 65–99)

## 2017-12-27 MED ORDER — BISOPROLOL FUMARATE 5 MG PO TABS: 2.5000 mg | ORAL_TABLET | Freq: Every day | ORAL | 0 refills | Status: DC

## 2017-12-27 MED ORDER — TRAZODONE HCL 50 MG PO TABS: 25.0000 mg | ORAL_TABLET | Freq: Every evening | ORAL | 0 refills | Status: AC | PRN

## 2017-12-27 MED ORDER — ATORVASTATIN CALCIUM 40 MG PO TABS: 40.0000 mg | ORAL_TABLET | Freq: Every day | ORAL | 0 refills | Status: AC

## 2017-12-27 MED ORDER — CLOPIDOGREL BISULFATE 75 MG PO TABS: 75.0000 mg | ORAL_TABLET | Freq: Every day | ORAL | 0 refills | Status: AC

## 2017-12-27 MED ORDER — ADULT MULTIVITAMIN W/MINERALS CH: 1.0000 | ORAL_TABLET | Freq: Every day | ORAL | Status: AC

## 2017-12-27 MED ORDER — TRAMADOL HCL 50 MG PO TABS: 50.0000 mg | ORAL_TABLET | Freq: Four times a day (QID) | ORAL | 0 refills | Status: AC | PRN

## 2017-12-27 MED ORDER — ENSURE ENLIVE PO LIQD: 237.0000 mL | Freq: Three times a day (TID) | ORAL | 1 refills | Status: AC

## 2017-12-27 MED ORDER — ACETAMINOPHEN 325 MG PO TABS: 650.0000 mg | ORAL_TABLET | Freq: Four times a day (QID) | ORAL | Status: AC | PRN

## 2017-12-27 MED ORDER — ASPIRIN 81 MG PO TBEC: 81.0000 mg | DELAYED_RELEASE_TABLET | Freq: Every day | ORAL | Status: AC

## 2017-12-27 MED ORDER — BISOPROLOL FUMARATE 5 MG PO TABS: 2.5000 mg | ORAL_TABLET | Freq: Every day | ORAL | 0 refills | Status: AC

## 2017-12-27 MED ORDER — FUROSEMIDE 40 MG PO TABS: 40.0000 mg | ORAL_TABLET | Freq: Every day | ORAL | 0 refills | Status: AC

## 2017-12-27 MED ORDER — SODIUM POLYSTYRENE SULFONATE 15 GM/60ML PO SUSP
30.0000 g | Freq: Once | ORAL | Status: DC
  Filled 2017-12-27: qty 120

## 2017-12-27 NOTE — Progress Notes (Signed)
IV and tele removed. Discharge instructions given to pt. 2 L of oxygen. Prescriptions given to pt. Pt has no further concerns at this time.

## 2017-12-27 NOTE — Progress Notes (Signed)
Home health set up with [facility] per Larita FifeLynn.

## 2017-12-27 NOTE — Discharge Summary (Signed)
[facility] [place] [facility]   PATIENT NAME: Walter Townsend    MR#:  [id number]  DATE OF BIRTH:  02/24/1925  DATE OF ADMISSION:  12/22/2017 ADMITTING PHYSICIAN: Cammy Copa, MD  DATE OF DISCHARGE:  12/27/17  PRIMARY CARE PHYSICIAN: Rafael Bihari, MD    ADMISSION DIAGNOSIS:  Peripheral edema [R60.9] SOB (shortness of breath) [R06.02] Elevated troponin [R74.8]  DISCHARGE DIAGNOSIS:  Active Problems:   NSTEMI (non-ST elevated myocardial infarction) (HCC) Chronic pulmonary fibrosis  SECONDARY DIAGNOSIS:   Past Medical History:  Diagnosis Date  . Cardiac pacemaker [facility]   . Diastolic dysfunction    a. 08/2013 Echo: EF 60-65%, no rwma, Gr1 DD, Ao sclerosis w/o stenosis. mildly dil LA/RA, nl RV fxn, PASP .  Marland Kitchen History of stress test    a. 12/2011 MV: EF 57%, no ischemia->Low risk; b. 09/2016 MV: Ef 55-65%, no ischemia->Low risk.  Marland Kitchen Hyperlipidemia   . Hypertension   . Orthostatic hypotension 08/22/2014  . PAF (paroxysmal atrial fibrillation) (HCC)    a. CHA2DS2VASc = 4--> Xarelto.  . Prostate enlargement   . PSVT (paroxysmal supraventricular tachycardia) (HCC)   . Pulmonary fibrosis (HCC)    a. On chronic supplemental O2 @ home.    HOSPITAL COURSE:   HISTORY OF PRESENT ILLNESS: Walter Townsend  is a 84 y.o. male with a known history of hypertension, hyperlipidemia, paroxysmal atrial fibrillation on chronic anticoagulation, BPH and pulmonary fibrosis, on home oxygen.  Patient was brought to emergency room for worsening shortness of breath going on for the past 3-4 days, gradually getting worse especially with exertion.  He also noticed some swelling and his feet in the past week.  Patient denies any fever or chills, no chest pain, no palpitations, no nausea, vomiting, diarrhea.  He was treated for an acute bronchitis episode with Levaquin just 3 weeks ago.  He could not finish the full antibiotic course at that time, due to stomach  upset. Test results from emergency room are remarkable for elevated troponin level at 0.56; WBC is slightly elevated at 12.8; BNP is elevated at 2117.  Chest x-ray shows pulmonary fibrosis no acute changes.  EKG is noted without any acute changes.  Oxygen saturation has been stable on home oxygen. Patient is admitted for further evaluation and treatment.   1. Acute on chronic respiratory failure-secondary to pulmonary fibrosis -Received 1 dose of Lasix. Now on 2 L oxygen which uses chronic oxygen -Received IV  steroids.  Patient currently is breathing comfortably.  Does not seem to have pulmonary fibrosis flare at present.  Will stop steroids.  - IV Lasix 20 mg --- now on Lasix 40 mg daily -Pulmonary consult appreciated -cardiology consult appreciated.  Currently he is on bisoprolol 2.5 mg daily, as needed lisinopril 2.5 mg. -Patient's Xarelto is on hold--will be started 1 month after-completing aspirin Plavix. -  Patient is s.p cardiac cath that showed Significant underlying three-vessel coronary artery disease but the culprit is severe ostial RCA disease. The coronary arteries are overall severely calcified.  Severely reduced LV systolic function with an EF of 25%. Moderately elevated left ventricular end-diastolic pressure. Severe mitral annular calcifications  Successful complex angioplasty and drug-eluting stent placement to the mid as well as ostial right coronary artery.  Okay to discharge patient from cardiology standpoint.  2. History of tachycardia palpitations, atrial fibrillation-status post pacemaker for AV block. -On Cardizem for rate control -Has history of chronic atypical chest pain -Xarelto is on hold for 1  month since patient is now on aspirin Plavix per cardiology recommendation  3. Elevated troponin-likely demand ischemia from respiratory distress on presentation vs rule out underlying coronary artery disease given wall motion findings on prior CTA and  echo -Cardiology consult appreciated -w/u as above   4. Hyperkalemia-patient on Lasix and a dose of Kayexalate given  5. DVT prophylaxis-off heparin drip  6.  Chronic anemia -Hemoglobin stable at 10.  No history of active bleeding at present  7. Chronic nausea -follows with Dr Markham Jordan -prn zofran and reglan. outpt w/u for nausea so far negative  Generalized weakness PT is recommending home health PT   Continue home oxygen  D/w patient's  Dr. Mariah Milling  Patient wants to go home tomorrow if he does well with physical therapy. Case discussed with Care Management/Social Worker. Management plans discussed with the patient, family and they are in agreement.    DISCHARGE CONDITIONS:   Stable  CONSULTS OBTAINED:  Treatment Team:  Yvonne Kendall, MD   PROCEDURES -left heart cardiac catheterization and coronary angiogram with 2 drug-eluting stent placement  DRUG ALLERGIES:   Allergies  Allergen Reactions  . Iodinated Diagnostic Agents Rash    PRE-MED WITH PREDNISONE    DISCHARGE MEDICATIONS:   Allergies as of 12/27/2017      Reactions   Iodinated Diagnostic Agents Rash   PRE-MED WITH PREDNISONE      Medication List    STOP taking these medications   diltiazem 120 MG 24 hr capsule Commonly known as:  CARDIZEM CD   meclizine 25 MG tablet Commonly known as:  ANTIVERT   potassium chloride SA 20 MEQ tablet Commonly known as:  K-DUR,KLOR-CON   XARELTO 20 MG Tabs tablet Generic drug:  rivaroxaban     TAKE these medications   acetaminophen 325 MG tablet Commonly known as:  TYLENOL Take 2 tablets (650 mg total) by mouth every 6 (six) hours as needed for mild pain (or Fever >/= 101).   aspirin 81 MG EC tablet Take 1 tablet (81 mg total) by mouth daily. Start taking on:  12/28/2017   atorvastatin 40 MG tablet Commonly known as:  LIPITOR Take 1 tablet (40 mg total) by mouth daily. Start taking on:  12/28/2017 What changed:    medication strength  how  much to take   bisoprolol 5 MG tablet Commonly known as:  ZEBETA Take 0.5 tablets (2.5 mg total) by mouth daily. Start taking on:  12/28/2017   Calcium-Vitamin D 600-200 MG-UNIT Caps Take 1 capsule by mouth daily.   chlorpheniramine-HYDROcodone 10-8 MG/5ML Suer Commonly known as:  TUSSIONEX PENNKINETIC ER Take 5 mLs by mouth at bedtime as needed for cough.   clopidogrel 75 MG tablet Commonly known as:  PLAVIX Take 1 tablet (75 mg total) by mouth daily with breakfast. Start taking on:  12/28/2017   cyanocobalamin 100 MCG tablet Take 100 mcg by mouth daily.   docusate sodium 100 MG capsule Commonly known as:  COLACE Take 200 mg by mouth at bedtime.   feeding supplement (ENSURE ENLIVE) Liqd Take 237 mLs by mouth 3 (three) times daily between meals.   furosemide 40 MG tablet Commonly known as:  LASIX Take 1 tablet (40 mg total) by mouth daily. Start taking on:  12/28/2017   lansoprazole 30 MG capsule Commonly known as:  PREVACID TAKE 1 CAPSULE DAILY   lisinopril 2.5 MG tablet Commonly known as:  ZESTRIL Take 1 tablet (2.5 mg total) by mouth daily. What changed:    when to take  this  reasons to take this   multivitamin with minerals Tabs tablet Take 1 tablet by mouth daily. Start taking on:  12/28/2017   PROSCAR 5 MG tablet Generic drug:  finasteride Take 1 tablet (5 mg total) by mouth daily.   traMADol 50 MG tablet Commonly known as:  ULTRAM Take 1 tablet (50 mg total) by mouth every 6 (six) hours as needed for moderate pain.   traZODone 50 MG tablet Commonly known as:  DESYREL Take 0.5 tablets (25 mg total) by mouth at bedtime as needed for sleep.        DISCHARGE INSTRUCTIONS:  Follow-up with primary care physician in 5-7 days. Follow-up with Dr. Mariah Milling in 5-7 days or sooner as needed Continue home oxygen   DIET:  Cardiac diet  DISCHARGE CONDITION:  Fair  ACTIVITY:  Activity as tolerated  OXYGEN:  Home Oxygen: Yes.     Oxygen Delivery: 2  liters/min via Patient connected to nasal cannula oxygen  DISCHARGE LOCATION:  home   If you experience worsening of your admission symptoms, develop shortness of breath, life threatening emergency, suicidal or homicidal thoughts you must seek medical attention immediately by calling 911 or calling your MD immediately  if symptoms less severe.  You Must read complete instructions/literature along with all the possible adverse reactions/side effects for all the Medicines you take and that have been prescribed to you. Take any new Medicines after you have completely understood and accpet all the possible adverse reactions/side effects.   Please note  You were cared for by a hospitalist during your hospital stay. If you have any questions about your discharge medications or the care you received while you were in the hospital after you are discharged, you can call the unit and asked to speak with the hospitalist on call if the hospitalist that took care of you is not available. Once you are discharged, your primary care physician will handle any further medical issues. Please note that NO REFILLS for any discharge medications will be authorized once you are discharged, as it is imperative that you return to your primary care physician (or establish a relationship with a primary care physician if you do not have one) for your aftercare needs so that they can reassess your need for medications and monitor your lab values.     Today  Chief Complaint  Patient presents with  . Shortness of Breath   Patient is feeling weak and tired but otherwise denies any chest pain or shortness of breath.  Patient ambulated in the hallway with the help of physical therapy with no discomfort  ROS:  CONSTITUTIONAL: Denies fevers, chills. Denies any fatigue, weakness.  EYES: Denies blurry vision, double vision, eye pain. EARS, NOSE, THROAT: Denies tinnitus, ear pain, hearing loss. RESPIRATORY: Denies cough, wheeze,  shortness of breath.  CARDIOVASCULAR: Denies chest pain, palpitations, edema.  GASTROINTESTINAL: Denies nausea, vomiting, diarrhea, abdominal pain. Denies bright red blood per rectum. GENITOURINARY: Denies dysuria, hematuria. ENDOCRINE: Denies nocturia or thyroid problems. HEMATOLOGIC AND LYMPHATIC: Denies easy bruising or bleeding. SKIN: Denies rash or lesion. MUSCULOSKELETAL: Denies pain in neck, back, shoulder, knees, hips or arthritic symptoms.  NEUROLOGIC: Denies paralysis, paresthesias.  PSYCHIATRIC: Denies anxiety or depressive symptoms.   VITAL SIGNS:  Blood pressure 96/60, pulse 63, temperature 98.2 F (36.8 C), temperature source Oral, resp. rate 18, height 5' 8.5" (1.74 m), weight 72.6 kg (160 lb 1.6 oz), SpO2 96 %.  I/O:    Intake/Output Summary (Last 24 hours) at 12/27/2017 1114  Last data filed at 12/27/2017 1027 Gross per 24 hour  Intake 720 ml  Output 200 ml  Net 520 ml    PHYSICAL EXAMINATION:  GENERAL:  83 y.o.-year-old patient lying in the bed with no acute distress.  EYES: Pupils equal, round, reactive to light and accommodation. No scleral icterus. Extraocular muscles intact.  HEENT: Head atraumatic, normocephalic. Oropharynx and nasopharynx clear.  NECK:  Supple, no jugular venous distention. No thyroid enlargement, no tenderness.  LUNGS: Normal breath sounds bilaterally, no wheezing, rales,rhonchi or crepitation. No use of accessory muscles of respiration.  CARDIOVASCULAR: S1, S2 normal. No murmurs, rubs, or gallops.  ABDOMEN: Soft, non-tender, non-distended. Bowel sounds present. No organomegaly or mass.  EXTREMITIES: Right wrist area, cath site with clean dressing, no pedal edema, cyanosis, or clubbing.  NEUROLOGIC: Cranial nerves II through XII are intact. Muscle strength almost at his baseline in all extremities. Sensation intact. Gait not checked.  PSYCHIATRIC: The patient is alert and oriented x 3.  SKIN: No obvious rash, lesion, or ulcer.   DATA  REVIEW:   CBC Recent Labs  Lab 12/26/17 0545  WBC 18.5*  HGB 10.9*  HCT 33.2*  PLT 359    Chemistries  Recent Labs  Lab 12/22/17 1918  12/24/17 0859 12/26/17 0545  NA  --    < >  --  134*  K  --    < >  --  5.3*  CL  --    < >  --  100*  CO2  --    < >  --  22  GLUCOSE  --    < >  --  112*  BUN  --    < >  --  30*  CREATININE  --    < >  --  0.88  CALCIUM  --    < >  --  8.6*  MG  --   --  1.9  --   AST 58*  --   --   --   ALT 42  --   --   --   ALKPHOS 343*  --   --   --   BILITOT 0.5  --   --   --    < > = values in this interval not displayed.    Cardiac Enzymes Recent Labs  Lab 12/25/17 1659  TROPONINI 0.62*    Microbiology Results  Results for orders placed or performed during the hospital encounter of 08/25/13  MRSA PCR Screening     Status: None   Collection Time: 08/25/13 12:54 PM  Result Value Ref Range Status   MRSA by PCR NEGATIVE NEGATIVE Final    Comment:        The GeneXpert MRSA Assay (FDA approved for NASAL specimens only), is one component of a comprehensive MRSA colonization surveillance program. It is not intended to diagnose MRSA infection nor to guide or monitor treatment for MRSA infections.    RADIOLOGY:  No results found.  EKG:   Orders placed or performed during the hospital encounter of 12/22/17  . ED EKG  . ED EKG  . EKG 12-Lead  . EKG 12-Lead  . EKG 12-Lead  . EKG 12-Lead  . EKG 12-Lead immediately post procedure  . EKG 12-Lead  . EKG 12-Lead  . EKG 12-Lead  . EKG 12-Lead immediately post procedure  . EKG 12-Lead  . EKG 12-Lead  . EKG 12-Lead  . EKG 12-Lead  . EKG 12-Lead  . EKG 12-Lead  .  EKG 12-Lead  . EKG 12-Lead  . EKG 12-Lead      Management plans discussed with the patient, family and they are in agreement.  CODE STATUS:     Code Status Orders  (From admission, onward)        Start     Ordered   12/22/17 2241  Full code  Continuous     12/22/17 2240    Code Status History    Date  Active Date Inactive Code Status Order ID Comments User Context   06/02/2017 21:48 06/03/2017 20:21 Full Code [id number]  Altamese DillingVachhani, Vaibhavkumar, MD Inpatient   08/26/2013 18:45 08/27/2013 14:44 Full Code [id number]  Marinus Mawaylor, Gregg W, MD Inpatient   08/25/2013 13:29 08/26/2013 18:45 Full Code [id number]  Minda MeoEdmisten, Brooke O, PA-C Inpatient      TOTAL TIME TAKING CARE OF THIS PATIENT: 42  minutes.   Note: This dictation was prepared with Dragon dictation along with smaller phrase technology. Any transcriptional errors that result from this process are unintentional.   @M[facility] @  on 12/27/2017 at 11:14 AM  Between 7am to 6pm - Pager - [phone]  After 6pm go to [email or web address] - password [email or web address]  [facility]  Office  [phone]  CC: Primary care physician; Rafael BihariWalker, [name], MD

## 2017-12-27 NOTE — Evaluation (Signed)
Physical Therapy Evaluation Patient Details Name: Walter Townsend MRN: [id number] DOB: 04/19/1925 Today's Date: 12/27/2017   History of Present Illness  pt is a 84 y.o M addmitted on 12/22/2017 with dx of NSTEMI. He has a PMhx of hypertension, hyperlipidemia, paroxysmal atrial fibrillation on chronic anticoagulation, BPH and pulmonary fibrosis, on home oxygen. pt reported SOB and LE swelling 3-4 days prior to admission.  Clinical Impression  Pt was laying in bed once PT entered the room. He is A&O x 4 and currently denies any pain or discomfort. He was able to perform all bed mobility I and sit to stand/ transfer to chair MOD I using RW. He is currently on 2 LPM and reports he uses O2 at home. In the room he was able to maintain standing balance at EOB to use urinal with minimal postural sway and he was able to ambulate ~340 ft with RW reporting minimal fatigue and no pain, and satted at 94%. Pt plans to return back to his house where he lives next to his son and his girlfriend will be available for assistance. Pt would benefit from continued physical therapy via home health upon discharge.     Follow Up Recommendations Home health PT    Equipment Recommendations  (pt reports he has a RW and rollator at home)    Recommendations for Other Services       Precautions / Restrictions Precautions Precautions: None Restrictions Weight Bearing Restrictions: No      Mobility  Bed Mobility Overal bed mobility: Independent Bed Mobility: Supine to Sit     Supine to sit: Independent        Transfers Overall transfer level: Modified independent Equipment used: Rolling walker (2 wheeled)             General transfer comment: able to perform supine <> sit and transfer using standing pivot to chair with RW  Ambulation/Gait Ambulation/Gait assistance: Modified independent (Device/Increase time) Ambulation Distance (Feet): 340 Feet Assistive device: Rolling walker (2 wheeled) Gait  Pattern/deviations: Step-through pattern;Antalgic        Stairs Stairs: Yes Stairs assistance: Modified independent (Device/Increase time) Stair Management: Two rails;Step to pattern Number of Stairs: 4 General stair comments: verbal cues required for proper form  Wheelchair Mobility    Modified Rankin (Stroke Patients Only)       Balance                                             Pertinent Vitals/Pain Pain Assessment: No/denies pain    Home Living Family/patient expects to be discharged to:: Private residence Living Arrangements: Other (Comment)(Girlfriend stays with him frequently and son lives next door) Available Help at Discharge: Family;Available 24 hours/day;Available PRN/intermittently Type of Home: House Home Access: Stairs to enter Entrance Stairs-Rails: Right Entrance Stairs-Number of Steps: 3 Home Layout: One level Home Equipment: Walker - 2 wheels;Crutches(rollator)      Prior Function Level of Independence: Independent               Hand Dominance   Dominant Hand: Right    Extremity/Trunk Assessment   Upper Extremity Assessment Upper Extremity Assessment: Overall WFL for tasks assessed    Lower Extremity Assessment Lower Extremity Assessment: Overall WFL for tasks assessed       Communication   Communication: No difficulties  Cognition Arousal/Alertness: Awake/alert Behavior During Therapy: WFL for tasks assessed/performed  Overall Cognitive Status: Within Functional Limits for tasks assessed                                        General Comments General comments (skin integrity, edema, etc.): pt was able to maintain standing balance to use urinal at bedside with minimal postural sway    Exercises     Assessment/Plan    PT Assessment All further PT needs can be met in the next venue of care  PT Problem List Decreased strength;Decreased activity tolerance;Decreased knowledge of use of  DME;Decreased safety awareness       PT Treatment Interventions      PT Goals (Current goals can be found in the Care Plan section)  Acute Rehab PT Goals Patient Stated Goal: to go home PT Goal Formulation: With patient Time For Goal Achievement: 01/10/18 Potential to Achieve Goals: Good    Frequency     Barriers to discharge        Co-evaluation               AM-PAC PT "6 Clicks" Daily Activity  Outcome Measure Difficulty turning over in bed (including adjusting bedclothes, sheets and blankets)?: None Difficulty moving from lying on back to sitting on the side of the bed? : None Difficulty sitting down on and standing up from a chair with arms (e.g., wheelchair, bedside commode, etc,.)?: None Help needed moving to and from a bed to chair (including a wheelchair)?: None Help needed walking in hospital room?: A Little Help needed climbing 3-5 steps with a railing? : A Little 6 Click Score: 22    End of Session Equipment Utilized During Treatment: Gait belt;Oxygen Activity Tolerance: Patient tolerated treatment well Patient left: in chair;with call bell/phone within reach;with chair alarm set   PT Visit Diagnosis: Other abnormalities of gait and mobility (R26.89);Unsteadiness on feet (R26.81)    Time: 4742-5956 PT Time Calculation (min) (ACUTE ONLY): 35 min   Charges:   PT Evaluation $PT Eval Moderate Complexity: 1 Mod PT Treatments $Gait Training: 23-37 mins   PT G Codes:        Dezaree Tracey PT, DPT, LAT, ATC  12/27/17  12:39 PM       Starr [place] 12/27/2017, 12:35 PM

## 2017-12-27 NOTE — Progress Notes (Signed)
Progress Note  Patient Name: Walter PickettWillard L Townsend Date of Encounter: 12/27/2017  Primary Cardiologist: Mariah MillingGollan  Subjective   No pain overnight chest Slight abdominal discomfort, "is that my heart"  anxiety concerning going home Does not know if he will have someone to take care of him Lives alone No shortness of breath, mild chronic leg weakness,   amblated with PT, walked in the hallways  Inpatient Medications    Scheduled Meds: . aspirin  324 mg Oral Once  . aspirin EC  81 mg Oral Daily  . atorvastatin  20 mg Oral Daily  . bisoprolol  2.5 mg Oral Daily  . calcium-vitamin D  1 tablet Oral Daily  . clopidogrel  75 mg Oral Q breakfast  . docusate sodium  100 mg Oral BID  . enoxaparin (LOVENOX) injection  40 mg Subcutaneous Q24H  . feeding supplement (ENSURE ENLIVE)  237 mL Oral TID BM  . finasteride  5 mg Oral Daily  . furosemide  40 mg Oral Daily  . lisinopril  2.5 mg Oral Daily  .  morphine injection  1 mg Intravenous Once  . multivitamin with minerals  1 tablet Oral Daily  . pantoprazole  40 mg Oral Daily  . pneumococcal 23 valent vaccine  0.5 mL Intramuscular Tomorrow-1000  . sodium chloride flush  3 mL Intravenous Q12H  . cyanocobalamin  100 mcg Oral Daily   Continuous Infusions: . sodium chloride     PRN Meds: sodium chloride, acetaminophen **OR** acetaminophen, bisacodyl, chlorpheniramine-HYDROcodone, Melatonin, metoCLOPramide (REGLAN) injection, ondansetron (ZOFRAN) IV, sodium chloride flush, traMADol, traZODone   Vital Signs    Vitals:   12/26/17 0819 12/26/17 2001 12/27/17 0458 12/27/17 0739  BP: 121/79 (!) 100/58 (!) 93/48 96/60  Pulse: 78 80 69 63  Resp: 14 18 17 18   Temp:  98.1 F (36.7 C) 98.2 F (36.8 C) 98.2 F (36.8 C)  TempSrc:  Oral Oral Oral  SpO2: 97% 98% 94% 96%  Weight:   160 lb 1.6 oz (72.6 kg)   Height:        Intake/Output Summary (Last 24 hours) at 12/27/2017 1358 Last data filed at 12/27/2017 1138 Gross per 24 hour  Intake 720  ml  Output 350 ml  Net 370 ml   Filed Weights   12/25/17 0714 12/26/17 0524 12/27/17 0458  Weight: 157 lb (71.2 kg) 158 lb 4.8 oz (71.8 kg) 160 lb 1.6 oz (72.6 kg)    Telemetry    V-paced -no significant arrhythmia,  personally Reviewed  ECG    n/a - Personally Reviewed  Physical Exam   Constitutional:  oriented to person, place, and time. No distress.  HENT:  Head: Normocephalic and atraumatic.  Eyes:  no discharge. No scleral icterus.  Neck: Normal range of motion. Neck supple. No JVD present.  Cardiovascular: Normal rate, regular rhythm, normal heart sounds and intact distal pulses. Exam reveals no gallop and no friction rub. No edema No murmur heard. Pulmonary/Chest:Diffuse bilateral rhonchi particularly at the bases halfway up bilaterally with diminished breath sounds.  Abdominal: Soft.  no distension.  no tenderness.  Musculoskeletal: Normal range of motion.  no  tenderness or deformity.  Neurological:  normal muscle tone. Coordination normal. No atrophy Skin: Skin is warm and dry. No rash noted. not diaphoretic.  Psychiatric:  normal mood and affect. behavior is normal. Thought content normal.      Labs    Chemistry Recent Labs  Lab 12/22/17 1918  12/23/17 1551 12/24/17 [id number] 12/26/17 [id number]  12/27/17 1103  NA  --    < > 134* 136 134*  --   K  --    < > 4.2 4.5 5.3* 4.5  CL  --    < > 100* 102 100*  --   CO2  --    < > 24 23 22   --   GLUCOSE  --    < > 180* 147* 112*  --   BUN  --    < > 17 22* 30*  --   CREATININE  --    < > 0.74 0.69 0.88  --   CALCIUM  --    < > 8.5* 8.5* 8.6*  --   PROT 6.5  --   --   --   --   --   ALBUMIN 2.6*  --   --   --   --   --   AST 58*  --   --   --   --   --   ALT 42  --   --   --   --   --   ALKPHOS 343*  --   --   --   --   --   BILITOT 0.5  --   --   --   --   --   GFRNONAA  --    < > >60 >60 >60  --   GFRAA  --    < > >60 >60 >60  --   ANIONGAP  --    < > 10 11 12   --    < > = values in this interval not displayed.       Hematology Recent Labs  Lab 12/24/17 0113 12/25/17 0641 12/26/17 0545  WBC 12.3* 16.9* 18.5*  RBC 3.70* 3.63* 3.62*  HGB 10.9* 10.9* 10.9*  HCT 33.5* 33.0* 33.2*  MCV 90.6 90.9 91.5  MCH 29.4 30.0 30.1  MCHC 32.5 33.0 32.9  RDW 13.8 13.8 14.1  PLT 319 363 359    Cardiac Enzymes Recent Labs  Lab 12/22/17 1914 12/23/17 0435 12/24/17 0859 12/25/17 1659  TROPONINI 0.56* 0.59* 0.58* 0.62*   No results for input(s): TROPIPOC in the last 168 hours.   BNP Recent Labs  Lab 12/22/17 1914  BNP 2,117.0*     DDimer No results for input(s): DDIMER in the last 168 hours.   Radiology    Dg Chest 2 View  Result Date: 12/22/2017 IMPRESSION: Marked pulmonary fibrosis similar to prior exam. No new segmental consolidation or pulmonary edema. Cardiomegaly with sequential pacemaker in place. Aortic Atherosclerosis (ICD10-I70.0). Electronically Signed   By: Lacy Duverney M.D.   On: 12/22/2017 20:05    Cardiac Studies   TTE 12/23/2017: Study Conclusions  - Left ventricle: The cavity size was mildly dilated. Wall   thickness was increased in a pattern of mild LVH. Systolic   function was moderately to severely reduced. The estimated   ejection fraction was in the range of 30% to 35%. There is severe   hypokinesis of the inferior, inferoseptal, and apical myocardium.   The study is not technically sufficient to allow evaluation of LV   diastolic function. - Aortic valve: Trileaflet; moderately thickened, mildly calcified   leaflets. Cusp separation was reduced. Though there is no   significant stenosis by velocity and mean gradient criteria,   degree of stenosis may be underestimated due to decreased LVEF. - Mitral valve: Calcified annulus. Mildly thickened leaflets .   There was  mild to moderate regurgitation. - Left atrium: The atrium was moderately dilated. - Right ventricle: The cavity size was mildly dilated. Systolic   function was mildly reduced. - Right atrium: The  atrium was mildly dilated. - Tricuspid valve: There was mild-moderate regurgitation. - Pulmonary arteries: Systolic pressure was mildly to moderately   increased, in the range of 40 mm Hg to 45 mm Hg.  Patient Profile     84 y.o. male with history of pulmonary fibrosis, diast dysfxn, PSVT, PAF on xarelto, HTN, HL, and CHB s/p PPM, who is being seen today for the evaluation of acute systolic CHF with elevated troponin at the request of Dr. Nemiah Commander.  Assessment & Plan    1. Acute systolic CHF with pulmonary hypertension: -Echo 2/6 with reduced EF 35% -Continue bisoprolol,  lisinopril  If blood pressure tolerates Spironolactone  spironolactone prior to discharge As an outpatient we could change him to [facility]    2. CAD Severe RCA stenosis, stent x2 placed  this hospital admission Difficult procedure, heavily calcified Aspirin and Plavix for 1 month after 30 days as an outpatient we will discontinue aspirin and restart Xarelto Atypical chest pain yesterday, resolved now  3. PAF: -V paced -Tolerating bisoprolol   CHADS2VASc of at least 5 (CHF, HTN age x 2, vascular disease) We will restart Xarelto in 30 days  4. CHB s/p PPM:  functioning normally on tele  5. Chronic respiratory failure with hypoxia secondary to pulmonary fibrosis: Slow progression of his disease over the past several years On oxygen at home Terminal disease  will need escalating  support from family  6.  Anxiety Major contributor to medical issues Family will need to be involved in care at home Has a girlfriend but may need to higher caretaker if too much for her He does have 2 sons  Long discussion with him concerning situation at home Past PT evaluation, safe to go home but will need help  Total encounter time more than 35 minutes  Greater than 50% was spent in counseling and coordination of care with the patient   For questions or updates, please contact [facility] Please consult  [email or web address] for contact info under Cardiology/STEMI.    Signed, Signed, Dossie Arbour, MD, Ph.D [facility]

## 2017-12-27 NOTE — Care Management Note (Signed)
Case Management Note  Patient Details  Name: Walter Townsend MRN: [id number] Date of Birth: [birth year]  Subjective/Objective:   Referral for HHPT, SW sent to Shaune Lee[facility]Jermaine Jenkins at [facility]. [name] is already open to [facility] for 02 and has his portable 02 tank at bedside. He has a RW at home.                  Action/Plan:   Expected Discharge Date:  12/27/17               Expected Discharge Plan:  Home w Home Health Services  In-House Referral:     Discharge planning Services  CM Consult  Post Acute Care Choice:    Choice offered to:  Patient  DME Arranged:    DME Agency:     HH Arranged:  PT, Social Work [facility] [employer]  [facility]  Status of Service:  Completed, signed off  If discussed at [employer] Meetings, dates discussed:    Additional Comments:  Eames Dibiasio A, RN 12/27/2017, 1:24 PM

## 2017-12-28 ENCOUNTER — Telehealth: Payer: Self-pay | Admitting: Cardiovascular Disease

## 2017-12-28 MED ORDER — LISINOPRIL 2.5 MG PO TABS: 2.5000 mg | ORAL_TABLET | Freq: Every day | ORAL | 0 refills | Status: AC

## 2017-12-28 NOTE — Telephone Encounter (Signed)
Patient gave me permission to speak with Burna MortimerWanda, daughter in law. Encouraged him to sign DPR at next visit.  Burna MortimerWanda reports patient is very short of breath anytime he exerts himself.  He just about gives out after using walker to walk to the bathroom.  He gets tired out easily. She reports he was like this about a week before recent admission as well. Home PT has called and will be coming to evaluate patient soon. Ankles are still swollen but not nearly as much as they were prior to admission. Patient did not weigh today but will do so moving forward. Denies chest pain. Reports some dizziness but not any more than normal which he has been experiencing for a year now. Patient's BP today a few hours after taking morning medications was 110/62, O2 @L  97%.  Patient was discharged 2/10. Has Left heart cath for NSTEMI 2/8. LV EF on 2/6 was 30-35%.  We discussed it may take time for patient to regain strength and EF being 30-35%.  Patient is taking furosemide 40 mg daily at this time. Advised to make sure to measure daily weights and they verbalized understanding. Patient has appointment on Friday with PCP and 2/19 with Dr Mariah MillingGollan. Routing to Dr Mariah MillingGollan for further advice.

## 2017-12-28 NOTE — Telephone Encounter (Signed)
Walter Townsend (pt daugher in Social workerlaw) calling stating pt was in hospital  Had some changes in medication   Lisinopril he is to take 2.5 mg in the morning  In the past he took 10 mg a day He stopped this for we placed him on another medication   She is calling asking if we may call back and just go over what patient is to be taking   Please call back

## 2017-12-29 NOTE — Telephone Encounter (Signed)
Spoke with patients daughter in law and she reports that he is requiring round the clock assistance. She stated that his breathing is terrible and that he just gasps with standing. Reviewed oxygen saturation and she states that his have been normal. She stated that they have discussed with him breathing through his nose verses his mouth but he continues to do that. Reviewed that if his oxygen numbers are normal then the gasping may just be from the way he is breathing. She reports that PT has not been out to see them yet and that she thought a home health nurse would also be seeing him. Reviewed that I would reach out to [facility] to see about the services he is receiving. She reports that he has follow up with PCP office this Friday and reviewed that may be a good opportunity to discuss other care options such as [facility]. Advised that message has been sent to Dr. Mariah MillingGollan for review and that I would call back if any further recommendations. She verbalized understanding of our conversation with no further questions at this time.

## 2017-12-29 NOTE — Telephone Encounter (Signed)
Pt daughter in law is calling back, would like to talk with a nurse. This is regarding a PT coming to his home. [facility]. Please call to discuss.

## 2017-12-31 NOTE — Telephone Encounter (Signed)
If he feels ankle swelling is excessive he could take additional Lasix after lunch Will need to be careful to avoid hypotension and dehydration Agree with home PT/OT Family may need to hire help at home

## 2017-12-31 NOTE — Telephone Encounter (Signed)
Reviewed recommendations with patients son and he states that they feel he was sent home too soon. Patient lives with him but they are not home all the time due to work. He states that his father requires 24/7 care and he is just not doing well. Reviewed instructions per Dr. Mariah MillingGollan and he verbalized understanding. He reports that family is concerned. Reviewed signs and symptoms to monitor for which would require immediate assistance. He verbalized understanding of our conversation and had no further questions at this time.

## 2018-01-01 ENCOUNTER — Other Ambulatory Visit: Payer: Self-pay

## 2018-01-01 ENCOUNTER — Inpatient Hospital Stay: Payer: Medicare Other

## 2018-01-01 ENCOUNTER — Inpatient Hospital Stay
Admission: AD | Admit: 2018-01-01 | Discharge: 2018-01-15 | DRG: 193 | Disposition: E | Payer: Medicare Other | Source: Ambulatory Visit | Attending: Internal Medicine | Admitting: Internal Medicine

## 2018-01-01 DIAGNOSIS — I959 Hypotension, unspecified: Secondary | ICD-10-CM | POA: Diagnosis present

## 2018-01-01 DIAGNOSIS — Z7982 Long term (current) use of aspirin: Secondary | ICD-10-CM

## 2018-01-01 DIAGNOSIS — I480 Paroxysmal atrial fibrillation: Secondary | ICD-10-CM | POA: Diagnosis present

## 2018-01-01 DIAGNOSIS — Z66 Do not resuscitate: Secondary | ICD-10-CM | POA: Diagnosis present

## 2018-01-01 DIAGNOSIS — I2510 Atherosclerotic heart disease of native coronary artery without angina pectoris: Secondary | ICD-10-CM | POA: Diagnosis present

## 2018-01-01 DIAGNOSIS — Z8249 Family history of ischemic heart disease and other diseases of the circulatory system: Secondary | ICD-10-CM | POA: Diagnosis not present

## 2018-01-01 DIAGNOSIS — J8410 Pulmonary fibrosis, unspecified: Secondary | ICD-10-CM | POA: Diagnosis present

## 2018-01-01 DIAGNOSIS — N400 Enlarged prostate without lower urinary tract symptoms: Secondary | ICD-10-CM | POA: Diagnosis present

## 2018-01-01 DIAGNOSIS — Z808 Family history of malignant neoplasm of other organs or systems: Secondary | ICD-10-CM

## 2018-01-01 DIAGNOSIS — Z91041 Radiographic dye allergy status: Secondary | ICD-10-CM

## 2018-01-01 DIAGNOSIS — I110 Hypertensive heart disease with heart failure: Secondary | ICD-10-CM | POA: Diagnosis present

## 2018-01-01 DIAGNOSIS — I214 Non-ST elevation (NSTEMI) myocardial infarction: Secondary | ICD-10-CM | POA: Diagnosis present

## 2018-01-01 DIAGNOSIS — J189 Pneumonia, unspecified organism: Principal | ICD-10-CM | POA: Diagnosis present

## 2018-01-01 DIAGNOSIS — J9621 Acute and chronic respiratory failure with hypoxia: Secondary | ICD-10-CM | POA: Diagnosis present

## 2018-01-01 DIAGNOSIS — I5023 Acute on chronic systolic (congestive) heart failure: Secondary | ICD-10-CM | POA: Diagnosis present

## 2018-01-01 DIAGNOSIS — Z955 Presence of coronary angioplasty implant and graft: Secondary | ICD-10-CM

## 2018-01-01 DIAGNOSIS — Z950 Presence of cardiac pacemaker: Secondary | ICD-10-CM

## 2018-01-01 DIAGNOSIS — Z515 Encounter for palliative care: Secondary | ICD-10-CM | POA: Diagnosis not present

## 2018-01-01 DIAGNOSIS — I252 Old myocardial infarction: Secondary | ICD-10-CM | POA: Diagnosis not present

## 2018-01-01 DIAGNOSIS — E785 Hyperlipidemia, unspecified: Secondary | ICD-10-CM | POA: Diagnosis present

## 2018-01-01 DIAGNOSIS — Z87891 Personal history of nicotine dependence: Secondary | ICD-10-CM

## 2018-01-01 DIAGNOSIS — Z7902 Long term (current) use of antithrombotics/antiplatelets: Secondary | ICD-10-CM | POA: Diagnosis not present

## 2018-01-01 DIAGNOSIS — J9600 Acute respiratory failure, unspecified whether with hypoxia or hypercapnia: Secondary | ICD-10-CM | POA: Diagnosis present

## 2018-01-01 DIAGNOSIS — Z9981 Dependence on supplemental oxygen: Secondary | ICD-10-CM

## 2018-01-01 DIAGNOSIS — G4700 Insomnia, unspecified: Secondary | ICD-10-CM | POA: Diagnosis present

## 2018-01-01 DIAGNOSIS — E222 Syndrome of inappropriate secretion of antidiuretic hormone: Secondary | ICD-10-CM | POA: Diagnosis present

## 2018-01-01 DIAGNOSIS — J9601 Acute respiratory failure with hypoxia: Secondary | ICD-10-CM | POA: Diagnosis not present

## 2018-01-01 DIAGNOSIS — R0602 Shortness of breath: Secondary | ICD-10-CM

## 2018-01-01 DIAGNOSIS — I2583 Coronary atherosclerosis due to lipid rich plaque: Secondary | ICD-10-CM | POA: Diagnosis not present

## 2018-01-01 LAB — CBC
HEMATOCRIT: 33.5 % — AB (ref 40.0–52.0)
Hemoglobin: 11 g/dL — ABNORMAL LOW (ref 13.0–18.0)
MCH: 29.7 pg (ref 26.0–34.0)
MCHC: 32.9 g/dL (ref 32.0–36.0)
MCV: 90.2 fL (ref 80.0–100.0)
Platelets: 333 10*3/uL (ref 150–440)
RBC: 3.71 MIL/uL — ABNORMAL LOW (ref 4.40–5.90)
RDW: 14.4 % (ref 11.5–14.5)
WBC: 16.6 10*3/uL — AB (ref 3.8–10.6)

## 2018-01-01 LAB — BASIC METABOLIC PANEL
ANION GAP: 10 (ref 5–15)
BUN: 19 mg/dL (ref 6–20)
CALCIUM: 8 mg/dL — AB (ref 8.9–10.3)
CO2: 23 mmol/L (ref 22–32)
Chloride: 95 mmol/L — ABNORMAL LOW (ref 101–111)
Creatinine, Ser: 0.67 mg/dL (ref 0.61–1.24)
GFR calc Af Amer: >60 mL/min (ref >60)
GFR calc non Af Amer: >60 mL/min (ref >60)
GLUCOSE: 145 mg/dL — AB (ref 65–99)
Potassium: 4.5 mmol/L (ref 3.5–5.1)
Sodium: 128 mmol/L — ABNORMAL LOW (ref 135–145)

## 2018-01-01 MED ORDER — ENSURE ENLIVE PO LIQD
237.0000 mL | Freq: Three times a day (TID) | ORAL | Status: DC
  Administered 2018-01-02 (×2): 237 mL via ORAL

## 2018-01-01 MED ORDER — TRAMADOL HCL 50 MG PO TABS: 50.0000 mg | ORAL_TABLET | Freq: Four times a day (QID) | ORAL | Status: DC | PRN

## 2018-01-01 MED ORDER — ONDANSETRON HCL 4 MG/2ML IJ SOLN
4.0000 mg | Freq: Four times a day (QID) | INTRAMUSCULAR | Status: DC | PRN
  Administered 2018-01-02: 4 mg via INTRAVENOUS
  Filled 2018-01-01: qty 2

## 2018-01-01 MED ORDER — ATORVASTATIN CALCIUM 20 MG PO TABS
40.0000 mg | ORAL_TABLET | Freq: Every day | ORAL | Status: DC
  Administered 2018-01-02: 40 mg via ORAL
  Filled 2018-01-01 (×2): qty 2

## 2018-01-01 MED ORDER — CALCIUM CARBONATE-VITAMIN D 500-200 MG-UNIT PO TABS
1.0000 | ORAL_TABLET | Freq: Every day | ORAL | Status: DC
  Administered 2018-01-02 – 2018-01-03 (×2): 1 via ORAL
  Filled 2018-01-01 (×2): qty 1

## 2018-01-01 MED ORDER — ONDANSETRON HCL 4 MG PO TABS
4.0000 mg | ORAL_TABLET | Freq: Four times a day (QID) | ORAL | Status: DC | PRN
  Administered 2018-01-01: 20:00:00 4 mg via ORAL
  Filled 2018-01-01: qty 1

## 2018-01-01 MED ORDER — PANTOPRAZOLE SODIUM 40 MG PO TBEC
40.0000 mg | DELAYED_RELEASE_TABLET | Freq: Every day | ORAL | Status: DC
  Administered 2018-01-02 – 2018-01-03 (×2): 40 mg via ORAL
  Filled 2018-01-01 (×2): qty 1

## 2018-01-01 MED ORDER — TRAZODONE HCL 50 MG PO TABS
25.0000 mg | ORAL_TABLET | Freq: Every evening | ORAL | Status: DC | PRN
  Administered 2018-01-01 – 2018-01-02 (×2): 25 mg via ORAL
  Filled 2018-01-01 (×2): qty 1

## 2018-01-01 MED ORDER — GUAIFENESIN-DM 100-10 MG/5ML PO SYRP
5.0000 mL | ORAL_SOLUTION | ORAL | Status: DC | PRN
  Administered 2018-01-01 – 2018-01-03 (×5): 5 mL via ORAL
  Filled 2018-01-01 (×12): qty 5

## 2018-01-01 MED ORDER — FUROSEMIDE 20 MG PO TABS
20.0000 mg | ORAL_TABLET | Freq: Every day | ORAL | Status: DC
  Administered 2018-01-01 – 2018-01-02 (×2): 20 mg via ORAL
  Filled 2018-01-01 (×2): qty 1

## 2018-01-01 MED ORDER — ADULT MULTIVITAMIN W/MINERALS CH
1.0000 | ORAL_TABLET | Freq: Every day | ORAL | Status: DC
  Administered 2018-01-02 – 2018-01-03 (×2): 1 via ORAL
  Filled 2018-01-01 (×2): qty 1

## 2018-01-01 MED ORDER — CLOPIDOGREL BISULFATE 75 MG PO TABS
75.0000 mg | ORAL_TABLET | Freq: Every day | ORAL | Status: DC
  Administered 2018-01-02 – 2018-01-03 (×2): 75 mg via ORAL
  Filled 2018-01-01 (×2): qty 1

## 2018-01-01 MED ORDER — HYDROCOD POLST-CPM POLST ER 10-8 MG/5ML PO SUER
5.0000 mL | Freq: Two times a day (BID) | ORAL | Status: DC | PRN
  Administered 2018-01-02 – 2018-01-03 (×2): 5 mL via ORAL
  Filled 2018-01-01 (×3): qty 5

## 2018-01-01 MED ORDER — ENOXAPARIN SODIUM 40 MG/0.4ML ~~LOC~~ SOLN
40.0000 mg | SUBCUTANEOUS | Status: DC
  Administered 2018-01-01 – 2018-01-02 (×2): 40 mg via SUBCUTANEOUS
  Filled 2018-01-01 (×2): qty 0.4

## 2018-01-01 MED ORDER — METHYLPREDNISOLONE SODIUM SUCC 40 MG IJ SOLR
40.0000 mg | Freq: Four times a day (QID) | INTRAMUSCULAR | Status: DC
  Administered 2018-01-01 – 2018-01-02 (×4): 40 mg via INTRAVENOUS
  Filled 2018-01-01 (×4): qty 1

## 2018-01-01 MED ORDER — DOCUSATE SODIUM 100 MG PO CAPS
200.0000 mg | ORAL_CAPSULE | Freq: Every day | ORAL | Status: DC
  Administered 2018-01-01 – 2018-01-02 (×2): 200 mg via ORAL
  Filled 2018-01-01 (×2): qty 2

## 2018-01-01 MED ORDER — FINASTERIDE 5 MG PO TABS
5.0000 mg | ORAL_TABLET | Freq: Every day | ORAL | Status: DC
  Administered 2018-01-02 – 2018-01-03 (×2): 5 mg via ORAL
  Filled 2018-01-01 (×2): qty 1

## 2018-01-01 MED ORDER — BISOPROLOL FUMARATE 5 MG PO TABS: 2.5000 mg | ORAL_TABLET | Freq: Every day | ORAL | Status: DC

## 2018-01-01 MED ORDER — DOCUSATE SODIUM 100 MG PO CAPS: 100.0000 mg | ORAL_CAPSULE | Freq: Two times a day (BID) | ORAL | Status: DC | PRN

## 2018-01-01 MED ORDER — VITAMIN B-12 100 MCG PO TABS
100.0000 ug | ORAL_TABLET | Freq: Every day | ORAL | Status: DC
  Administered 2018-01-02 – 2018-01-03 (×2): 100 ug via ORAL
  Filled 2018-01-01 (×2): qty 1

## 2018-01-01 MED ORDER — ALBUTEROL SULFATE (2.5 MG/3ML) 0.083% IN NEBU
2.5000 mg | INHALATION_SOLUTION | RESPIRATORY_TRACT | Status: DC | PRN
  Administered 2018-01-02 – 2018-01-03 (×2): 2.5 mg via RESPIRATORY_TRACT
  Filled 2018-01-01 (×2): qty 3

## 2018-01-01 MED ORDER — IPRATROPIUM-ALBUTEROL 0.5-2.5 (3) MG/3ML IN SOLN
3.0000 mL | Freq: Four times a day (QID) | RESPIRATORY_TRACT | Status: DC
  Administered 2018-01-01 – 2018-01-02 (×3): 3 mL via RESPIRATORY_TRACT
  Filled 2018-01-01 (×3): qty 3

## 2018-01-01 MED ORDER — ACETAMINOPHEN 325 MG PO TABS: 650.0000 mg | ORAL_TABLET | Freq: Four times a day (QID) | ORAL | Status: DC | PRN

## 2018-01-01 MED ORDER — ASPIRIN EC 81 MG PO TBEC
81.0000 mg | DELAYED_RELEASE_TABLET | Freq: Every day | ORAL | Status: DC
  Administered 2018-01-02 – 2018-01-03 (×2): 81 mg via ORAL
  Filled 2018-01-01 (×3): qty 1

## 2018-01-01 NOTE — Progress Notes (Signed)
Family Meeting Note  [facility] Directive:yes  Today a meeting took place with the Patient, daughter-in-law one died bedside     The following clinical team members were present during this meeting:MD  The following were discussed:Patient's diagnosis: Acute on chronic hypoxic respiratory failure, recent history of non-STEMI, chronic pulmonary fibrosis, hypertension.   Patient's progosis: Poor prognosis and Goals for treatment: DNR, 2 sons and daughter-in-law Burna MortimerWanda of the healthcare power of attorney.  Patient is considering palliative care and if no improvement hospice care at home  Additional follow-up to be provided: Hospitalist, palliative care, pulmonology  Time spent during discussion:18 min  Ramonita LabAruna Jeneal Vogl, MD

## 2018-01-01 NOTE — H&P (Signed)
[facility] [place] at [facility]   PATIENT NAME: Walter Townsend    MR#:  [id number]  DATE OF BIRTH:  1925/09/12  DATE OF ADMISSION:  01-05-2018  PRIMARY CARE PHYSICIAN: Gracelyn Nurse, MD   REQUESTING/REFERRING PHYSICIAN: Dr. Letitia Libra  CHIEF COMPLAINT:   Worsening of shortness of breath HISTORY OF PRESENT ILLNESS:  Shakir Petrosino  is a 84 y.o. male with a known history of chronic pulmonary fibrosis, recent history of non-STEMI, paroxysmal atrial fibrillation, chronically lives on 2 L of oxygen is presenting with shortness of breath to the doctor's office.  Patient was requiring 4 L of oxygen and was found to be hypotensive.  Sent over to the hospital as a direct admit.  Patient usually takes 40 mg of Lasix at home.  Getting short of breath with minimal exertion, denies any chest pain but feeling tight in his chest.  Daughter-in-law at bedside  PAST MEDICAL HISTORY:   Past Medical History:  Diagnosis Date  . Cardiac pacemaker st Judes   . Diastolic dysfunction    a. 08/2013 Echo: EF 60-65%, no rwma, Gr1 DD, Ao sclerosis w/o stenosis. mildly dil LA/RA, nl RV fxn, PASP .  Marland Kitchen History of stress test    a. 12/2011 MV: EF 57%, no ischemia->Low risk; b. 09/2016 MV: Ef 55-65%, no ischemia->Low risk.  Marland Kitchen Hyperlipidemia   . Hypertension   . Orthostatic hypotension 08/22/2014  . PAF (paroxysmal atrial fibrillation) (HCC)    a. CHA2DS2VASc = 4--> Xarelto.  . Prostate enlargement   . PSVT (paroxysmal supraventricular tachycardia) (HCC)   . Pulmonary fibrosis (HCC)    a. On chronic supplemental O2 @ home.    PAST SURGICAL HISTOIRY:   Past Surgical History:  Procedure Laterality Date  . BACK SURGERY    . CORONARY STENT INTERVENTION N/A 12/25/2017   Procedure: CORONARY STENT INTERVENTION;  Surgeon: Iran Ouch, MD;  Location: [facility] INVASIVE CV LAB;  Service: Cardiovascular;  Laterality: N/A;  . FOOT SURGERY    . HERNIA REPAIR    . INSERT / REPLACE /  REMOVE PACEMAKER    . LEFT HEART CATH AND CORONARY ANGIOGRAPHY N/A 12/25/2017   Procedure: LEFT HEART CATH AND CORONARY ANGIOGRAPHY;  Surgeon: Iran Ouch, MD;  Location: [facility] INVASIVE CV LAB;  Service: Cardiovascular;  Laterality: N/A;  . PACEMAKER INSERTION  08/26/2013   St. Jude Assurity DDD pacemaker  . PERMANENT PACEMAKER INSERTION N/A 08/26/2013   Procedure: PERMANENT PACEMAKER INSERTION;  Surgeon: Marinus Maw, MD;  Location: [facility] CATH LAB;  Service: Cardiovascular;  Laterality: N/A;  . TEMPORARY PACEMAKER INSERTION N/A 08/25/2013   Procedure: TEMPORARY PACEMAKER INSERTION;  Surgeon: Lesleigh Noe, MD;  Location: [facility] CATH LAB;  Service: Cardiovascular;  Laterality: N/A;    SOCIAL HISTORY:   Social History   Tobacco Use  . Smoking status: Former Smoker    Packs/day: 0.30    Years: 3.00    Pack years: 0.90    Types: Cigarettes    Last attempt to quit: 11/17/1948    Years since quitting: 69.1  . Smokeless tobacco: Never Used  Substance Use Topics  . Alcohol use: No    FAMILY HISTORY:   Family History  Problem Relation Age of Onset  . Heart disease Father   . Heart attack Brother   . Heart attack Sister   . Heart attack Brother   . Heart failure Brother   . Heart attack Brother   . Heart attack Brother   .  Cancer Brother        skin  . Heart disease Other   . Coronary artery disease Other   . Heart disease Son [id number]       stent placement     DRUG ALLERGIES:   Allergies  Allergen Reactions  . Iodinated Diagnostic Agents Rash    PRE-MED WITH PREDNISONE    REVIEW OF SYSTEMS:  CONSTITUTIONAL: No fever, fatigue or weakness.  EYES: No blurred or double vision.  EARS, NOSE, AND THROAT: No tinnitus or ear pain.  RESPIRATORY: Reporting cough, shortness of breath, wheezing, denies hemoptysis.  CARDIOVASCULAR: No chest pain, orthopnea, edema.  GASTROINTESTINAL: No nausea, vomiting, diarrhea or abdominal pain.  GENITOURINARY: No dysuria, hematuria.  ENDOCRINE:  No polyuria, nocturia,  HEMATOLOGY: No anemia, easy bruising or bleeding SKIN: No rash or lesion. MUSCULOSKELETAL: No joint pain or arthritis.   NEUROLOGIC: No tingling, numbness, weakness.  PSYCHIATRY: No anxiety or depression.   MEDICATIONS AT HOME:   Prior to Admission medications   Medication Sig Start Date End Date Taking? Authorizing Provider  acetaminophen (TYLENOL) 325 MG tablet Take 2 tablets (650 mg total) by mouth every 6 (six) hours as needed for mild pain (or Fever >/= 101). 12/27/17   Ramonita LabGouru, Brylen Wagar, MD  aspirin EC 81 MG EC tablet Take 1 tablet (81 mg total) by mouth daily. 12/28/17   Damaria Vachon, Deanna ArtisAruna, MD  atorvastatin (LIPITOR) 40 MG tablet Take 1 tablet (40 mg total) by mouth daily. 12/28/17   Kadien Lineman, Deanna ArtisAruna, MD  bisoprolol (ZEBETA) 5 MG tablet Take 0.5 tablets (2.5 mg total) by mouth daily. 12/28/17   Ramonita LabGouru, Breckin Zafar, MD  Calcium Carbonate-Vitamin D (CALCIUM-VITAMIN D) 600-200 MG-UNIT CAPS Take 1 capsule by mouth daily.      [provider]  chlorpheniramine-HYDROcodone (TUSSIONEX PENNKINETIC ER) 10-8 MG/5ML SUER Take 5 mLs by mouth at bedtime as needed for cough. 12/07/17   Merwyn KatosSimonds, David B, MD  clopidogrel (PLAVIX) 75 MG tablet Take 1 tablet (75 mg total) by mouth daily with breakfast. 12/28/17   Dontrell Stuck, Deanna ArtisAruna, MD  cyanocobalamin 100 MCG tablet Take 100 mcg by mouth daily.    [provider]  docusate sodium (COLACE) 100 MG capsule Take 200 mg by mouth at bedtime.     [provider]  feeding supplement, ENSURE ENLIVE, (ENSURE ENLIVE) LIQD Take 237 mLs by mouth 3 (three) times daily between meals. 12/27/17   Vollie Aaron, Deanna ArtisAruna, MD  finasteride (PROSCAR) 5 MG tablet Take 1 tablet (5 mg total) by mouth daily. 09/22/16   Antonieta IbaGollan, Timothy J, MD  furosemide (LASIX) 40 MG tablet Take 1 tablet (40 mg total) by mouth daily. 12/28/17   Ramonita LabGouru, Daxton Nydam, MD  lansoprazole (PREVACID) 30 MG capsule TAKE 1 CAPSULE DAILY 10/06/17   Antonieta IbaGollan, Timothy J, MD  lisinopril (ZESTRIL) 2.5 MG tablet Take  1 tablet (2.5 mg total) by mouth daily. 12/28/17   Antonieta IbaGollan, Timothy J, MD  Multiple Vitamin (MULTIVITAMIN WITH MINERALS) TABS tablet Take 1 tablet by mouth daily. 12/28/17   Altariq Goodall, Deanna ArtisAruna, MD  traMADol (ULTRAM) 50 MG tablet Take 1 tablet (50 mg total) by mouth every 6 (six) hours as needed for moderate pain. 12/27/17   Ramonita LabGouru, Senon Nixon, MD  traZODone (DESYREL) 50 MG tablet Take 0.5 tablets (25 mg total) by mouth at bedtime as needed for sleep. 12/27/17   Arliss Frisina, Deanna ArtisAruna, MD      VITAL SIGNS:  Blood pressure (!) 105/58, pulse 99, temperature 97.8 F (36.6 C), temperature source Oral, resp. rate 20, height 5' 8.5" (  1.74 m), weight 71.7 kg (158 lb 1.6 oz), SpO2 91 %.  PHYSICAL EXAMINATION:  GENERAL:  84 y.o.-year-old patient lying in the bed with no acute distress.  EYES: Pupils equal, round, reactive to light and accommodation. No scleral icterus. Extraocular muscles intact.  HEENT: Head atraumatic, normocephalic. Oropharynx and nasopharynx clear.  NECK:  Supple, no jugular venous distention. No thyroid enlargement, no tenderness.  LUNGS:  diminished breath sounds bilaterally, bilateral expiratory wheezing, rattly and crackly crepitation. No use of accessory muscles of respiration.  CARDIOVASCULAR: S1, S2 normal. No murmurs, rubs, or gallops.  ABDOMEN: Soft, nontender, nondistended. Bowel sounds present.  EXTREMITIES: No pedal edema, cyanosis, or clubbing.  NEUROLOGIC: Cranial nerves II through XII are intact.  Sensation intact. Gait not checked.  PSYCHIATRIC: The patient is alert and oriented x 3.  SKIN: No obvious rash, lesion, or ulcer.   LABORATORY PANEL:   CBC Recent Labs  Lab 01/03/2018 1834  WBC 16.6*  HGB 11.0*  HCT 33.5*  PLT 333   ------------------------------------------------------------------------------------------------------------------  Chemistries  Recent Labs  Lab 12/20/2017 1834  NA 128*  K 4.5  CL 95*  CO2 23  GLUCOSE 145*  BUN 19  CREATININE 0.67  CALCIUM 8.0*    ------------------------------------------------------------------------------------------------------------------  Cardiac Enzymes No results for input(s): TROPONINI in the last 168 hours. ------------------------------------------------------------------------------------------------------------------  RADIOLOGY:  No results found.  EKG:   Orders placed or performed during the hospital encounter of 12/22/17  . ED EKG  . ED EKG  . EKG 12-Lead  . EKG 12-Lead  . EKG 12-Lead  . EKG 12-Lead  . EKG 12-Lead immediately post procedure  . EKG 12-Lead  . EKG 12-Lead  . EKG 12-Lead  . EKG 12-Lead immediately post procedure  . EKG 12-Lead  . EKG 12-Lead  . EKG 12-Lead  . EKG 12-Lead  . EKG 12-Lead  . EKG 12-Lead  . EKG 12-Lead  . EKG 12-Lead  . EKG 12-Lead    IMPRESSION AND PLAN:   Cable Fearn  is a 84 y.o. male with a known history of chronic pulmonary fibrosis, recent history of non-STEMI, paroxysmal atrial fibrillation, chronically lives on 2 L of oxygen is presenting with shortness of breath to the doctor's office.  Patient was requiring 4 L of oxygen and was found to be hypotensive.  Sent over to the hospital as a direct admit.  Patient usually takes 40 mg of Lasix at home.  #Acute on chronic hypoxic respiratory failure with worsening of pulmonary fibrosis Admit to MedSurg unit Oxygen increased from 2 L of home oxygen to 4 L Lasix 20 mg with close monitoring of the blood pressure as patient is currently hypotensive Pulmonology consulted patient was seen by Dr. 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 Management plans discussed with the patient, family and they are in agreement.  CODE STATUS: DO NOT RESUSCITATE  TOTAL TIME TAKING CARE OF THIS PATIENT: 43 minutes.   Note: This dictation was prepared with Dragon dictation along with smaller phrase technology. Any  transcriptional errors that result from this process are unintentional.  Ramonita Lab M.D on 01/11/2018 at 7:26 PM  Between 7am to 6pm - Pager - [phone]  After 6pm go to [email or web address] - password [email or web address] [facility]  [facility]  Office  [phone]  CC: Primary care physician; Gracelyn Nurse, MD

## 2018-01-02 ENCOUNTER — Encounter: Payer: Self-pay | Admitting: Internal Medicine

## 2018-01-02 DIAGNOSIS — J8410 Pulmonary fibrosis, unspecified: Secondary | ICD-10-CM

## 2018-01-02 DIAGNOSIS — J9601 Acute respiratory failure with hypoxia: Secondary | ICD-10-CM

## 2018-01-02 DIAGNOSIS — I214 Non-ST elevation (NSTEMI) myocardial infarction: Secondary | ICD-10-CM

## 2018-01-02 DIAGNOSIS — I2583 Coronary atherosclerosis due to lipid rich plaque: Secondary | ICD-10-CM

## 2018-01-02 DIAGNOSIS — I2510 Atherosclerotic heart disease of native coronary artery without angina pectoris: Secondary | ICD-10-CM

## 2018-01-02 LAB — BASIC METABOLIC PANEL
Anion gap: 8 (ref 5–15)
BUN: 18 mg/dL (ref 6–20)
CALCIUM: 8.2 mg/dL — AB (ref 8.9–10.3)
CO2: 26 mmol/L (ref 22–32)
CREATININE: 0.62 mg/dL (ref 0.61–1.24)
Chloride: 97 mmol/L — ABNORMAL LOW (ref 101–111)
GFR calc Af Amer: >60 mL/min (ref >60)
GFR calc non Af Amer: >60 mL/min (ref >60)
GLUCOSE: 176 mg/dL — AB (ref 65–99)
Potassium: 4.2 mmol/L (ref 3.5–5.1)
Sodium: 131 mmol/L — ABNORMAL LOW (ref 135–145)

## 2018-01-02 MED ORDER — FUROSEMIDE 40 MG PO TABS
40.0000 mg | ORAL_TABLET | Freq: Two times a day (BID) | ORAL | Status: DC
Start: 2018-01-02 — End: 2018-01-03
  Administered 2018-01-02: 40 mg via ORAL
  Filled 2018-01-02: qty 1

## 2018-01-02 MED ORDER — IPRATROPIUM-ALBUTEROL 0.5-2.5 (3) MG/3ML IN SOLN
3.0000 mL | Freq: Two times a day (BID) | RESPIRATORY_TRACT | Status: DC
  Administered 2018-01-02: 3 mL via RESPIRATORY_TRACT
  Filled 2018-01-02: qty 3

## 2018-01-02 MED ORDER — LEVOFLOXACIN IN D5W 750 MG/150ML IV SOLN
750.0000 mg | INTRAVENOUS | Status: DC
  Filled 2018-01-02: qty 150

## 2018-01-02 MED ORDER — SODIUM CHLORIDE 0.9 % IV SOLN
100.0000 mg | Freq: Two times a day (BID) | INTRAVENOUS | Status: DC
  Administered 2018-01-02 – 2018-01-03 (×2): 100 mg via INTRAVENOUS
  Filled 2018-01-02 (×4): qty 100

## 2018-01-02 MED ORDER — SODIUM CHLORIDE 0.9 % IV SOLN
1.0000 g | INTRAVENOUS | Status: DC
  Administered 2018-01-02: 17:00:00 1 g via INTRAVENOUS
  Filled 2018-01-02 (×2): qty 10

## 2018-01-02 MED ORDER — METHYLPREDNISOLONE SODIUM SUCC 40 MG IJ SOLR
40.0000 mg | Freq: Two times a day (BID) | INTRAMUSCULAR | Status: DC
  Administered 2018-01-03: 01:00:00 40 mg via INTRAVENOUS
  Filled 2018-01-02: qty 1

## 2018-01-02 NOTE — Progress Notes (Signed)
No pain this shift. Exertional shortness of breath noted. Patient states that shortness of breath when moving has increased throughout the day. He became very short of breath around 1700 after getting up to the bedside commode. Tussionex was administered for cough and patient also received a prn breathing treatment. He also received Zofran for complaint of nausea. Dr. Elpidio AnisSudini was made aware of increased short of breath on exertion. Patient is currently resting quietly with family at the bedside and is in no distress.

## 2018-01-02 NOTE — Progress Notes (Addendum)
Pharmacy Antibiotic Note  Walter Townsend is a 84 y.o. male admitted on 30-May-2018 with pneumonia.  Pharmacy has been consulted for ceftriaxone and doxycycline dosing.  Plan: Ceftriaxone 1g IV every 24 hours  Doxycycline 100mg  IV every 12 hours   Height: 5' 8.5" (174 cm) Weight: 158 lb 1.6 oz (71.7 kg) IBW/kg (Calculated) : 69.55  Temp (24hrs), Avg:97.8 F (36.6 C), Min:97.2 F (36.2 C), Max:98.4 F (36.9 C)  Recent Labs  Lab 12/27/2017 1834 01/02/18 0358  WBC 16.6*  --   CREATININE 0.67 0.62    Estimated Creatinine Clearance: 60.4 mL/min (by C-G formula based on SCr of 0.62 mg/dL).    Allergies  Allergen Reactions  . Iodinated Diagnostic Agents Rash    PRE-MED WITH PREDNISONE    Antimicrobials this admission: 2/16 ceftriaxone >> 2/16 doxycycline >>   Dose adjustments this admission:   Microbiology results:   Thank you for allowing pharmacy to be a part of this patient's care.  Cleopatra CedarStephanie Ramani Riva, PharmD  Pharmacy Resident  01/02/2018 3:05 PM
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
## 2018-01-02 NOTE — Consult Note (Signed)
Pulmonary Critical Care  Initial Consult Note  Walter Townsend:[id number] DOB: [birth year] DOA: 01-11-18  Referring physician: Milagros Loll, MD  Chief Complaint: Shortness of Breath  HPI: Walter Townsend is a 84 y.o. male  With advanced Pulmonary fibrosis followed by Dr simonds. Patient presented to the hospital for increased SOB noted with more swelling of the legs also. He had been recently seen in the office Patient states that he had noted some cough and some congestion. Patient was hypoxic on presentation to the hospital despite using oxygen at home. He states that he had to go up on his oxygen therapy. On presentation he was noted to have a NSTEMI and was evaluated by cardiology. He has had a PCI done and has had really no major improvement. Patient was started on abx and steroids and also nebs. He has not improved and does not appear he will. The PCP has had a discussion with the patient and family regarding prognosis and they do understand that it is not good  Review of Systems:  Constitutional:  No weight loss, night sweats, Fevers, chills, fatigue.  HEENT:  No headaches, nasal congestion, post nasal drip,  Cardio-vascular:  No chest pain, +Orthopnea, +PND, +swelling in lower extremities  GI:  No heartburn, indigestion, abdominal pain, nausea, vomiting, diarrhea  Resp:  +shortness of breath. +productive cough, No coughing up of blood Skin:  no rash or lesions.  Musculoskeletal:  No joint pain or swelling.   Remainder ROS performed and is unremarkable other than noted in HPI  Past Medical History:  Diagnosis Date  . Cardiac pacemaker [employer]   . Diastolic dysfunction    a. 08/2013 Echo: EF 60-65%, no rwma, Gr1 DD, Ao sclerosis w/o stenosis. mildly dil LA/RA, nl RV fxn, PASP .  Marland Kitchen History of stress test    a. 12/2011 MV: EF 57%, no ischemia->Low risk; b. 09/2016 MV: Ef 55-65%, no ischemia->Low risk.  Marland Kitchen Hyperlipidemia   . Hypertension   . Orthostatic  hypotension 08/22/2014  . PAF (paroxysmal atrial fibrillation) (HCC)    a. CHA2DS2VASc = 4--> Xarelto.  . Prostate enlargement   . PSVT (paroxysmal supraventricular tachycardia) (HCC)   . Pulmonary fibrosis (HCC)    a. On chronic supplemental O2 @ home.   Past Surgical History:  Procedure Laterality Date  . BACK SURGERY    . CORONARY STENT INTERVENTION N/A 12/25/2017   Procedure: CORONARY STENT INTERVENTION;  Surgeon: Iran Ouch, MD;  Location: [facility] INVASIVE CV LAB;  Service: Cardiovascular;  Laterality: N/A;  . FOOT SURGERY    . HERNIA REPAIR    . INSERT / REPLACE / REMOVE PACEMAKER    . LEFT HEART CATH AND CORONARY ANGIOGRAPHY N/A 12/25/2017   Procedure: LEFT HEART CATH AND CORONARY ANGIOGRAPHY;  Surgeon: Iran Ouch, MD;  Location: [facility] INVASIVE CV LAB;  Service: Cardiovascular;  Laterality: N/A;  . PACEMAKER INSERTION  08/26/2013   St[employer] Jude Assurity DDD pacemaker  . PERMANENT PACEMAKER INSERTION N/A 08/26/2013   Procedure: PERMANENT PACEMAKER INSERTION;  Surgeon: Marinus Maw, MD;  Location: [facility] CATH LAB;  Service: Cardiovascular;  Laterality: N/A;  . TEMPORARY PACEMAKER INSERTION N/A 08/25/2013   Procedure: TEMPORARY PACEMAKER INSERTION;  Surgeon: Lesleigh Noe, MD;  Location: [facility] CATH LAB;  Service: Cardiovascular;  Laterality: N/A;   Social History:  reports that he quit smoking about 69 years ago. His smoking use included cigarettes. He has a 0.90 pack-year smoking history. he has never used smokeless tobacco. He  reports that he does not drink alcohol or use drugs.  Allergies  Allergen Reactions  . Iodinated Diagnostic Agents Rash    PRE-MED WITH PREDNISONE    Family History  Problem Relation Age of Onset  . Heart disease Father   . Heart attack Brother   . Heart attack Sister   . Heart attack Brother   . Heart failure Brother   . Heart attack Brother   . Heart attack Brother   . Cancer Brother        skin  . Heart disease Other   . Coronary artery  disease Other   . Heart disease Son 73       stent placement     Prior to Admission medications   Medication Sig Start Date End Date Taking? Authorizing Provider  acetaminophen (TYLENOL) 325 MG tablet Take 2 tablets (650 mg total) by mouth every 6 (six) hours as needed for mild pain (or Fever >/= 101). 12/27/17  Yes Gouru, Deanna Artis, MD  aspirin EC 81 MG EC tablet Take 1 tablet (81 mg total) by mouth daily. 12/28/17  Yes Gouru, Deanna Artis, MD  atorvastatin (LIPITOR) 40 MG tablet Take 1 tablet (40 mg total) by mouth daily. 12/28/17  Yes Gouru, Deanna Artis, MD  bisoprolol (ZEBETA) 5 MG tablet Take 0.5 tablets (2.5 mg total) by mouth daily. 12/28/17  Yes Gouru, Deanna Artis, MD  Calcium Carbonate-Vitamin D (CALCIUM-VITAMIN D) 600-200 MG-UNIT CAPS Take 1 capsule by mouth daily.     Yes [provider]  chlorpheniramine-HYDROcodone (TUSSIONEX PENNKINETIC ER) 10-8 MG/5ML SUER Take 5 mLs by mouth at bedtime as needed for cough. 12/07/17  Yes Merwyn Katos, MD  clopidogrel (PLAVIX) 75 MG tablet Take 1 tablet (75 mg total) by mouth daily with breakfast. 12/28/17  Yes Gouru, Aruna, MD  cyanocobalamin 100 MCG tablet Take 100 mcg by mouth daily.   Yes [provider]  docusate sodium (COLACE) 100 MG capsule Take 200 mg by mouth at bedtime.    Yes [provider]  feeding supplement, ENSURE ENLIVE, (ENSURE ENLIVE) LIQD Take 237 mLs by mouth 3 (three) times daily between meals. 12/27/17  Yes Gouru, Deanna Artis, MD  finasteride (PROSCAR) 5 MG tablet Take 1 tablet (5 mg total) by mouth daily. 09/22/16  Yes Gollan, Tollie Pizza, MD  furosemide (LASIX) 40 MG tablet Take 1 tablet (40 mg total) by mouth daily. 12/28/17  Yes Gouru, Deanna Artis, MD  lansoprazole (PREVACID) 30 MG capsule Take 30 mg by mouth daily at 12 noon.   Yes [provider]  lisinopril (ZESTRIL) 2.5 MG tablet Take 1 tablet (2.5 mg total) by mouth daily. 12/28/17  Yes Antonieta Iba, MD  Multiple Vitamin (MULTIVITAMIN WITH MINERALS) TABS tablet Take 1  tablet by mouth daily. 12/28/17  Yes Gouru, Deanna Artis, MD  traMADol (ULTRAM) 50 MG tablet Take 1 tablet (50 mg total) by mouth every 6 (six) hours as needed for moderate pain. 12/27/17  Yes Gouru, Deanna Artis, MD  traZODone (DESYREL) 50 MG tablet Take 0.5 tablets (25 mg total) by mouth at bedtime as needed for sleep. 12/27/17  Yes Ramonita Lab, MD   Physical Exam: Vitals:   01/02/18 0211 01/02/18 0517 01/02/18 1023 01/02/18 1334  BP:  105/71 (!) 102/59 (!) 114/58  Pulse:  82 67 73  Resp:  16  20  Temp:  (!) 97.2 F (36.2 C)  98.4 F (36.9 C)  TempSrc:      SpO2: 91% 94%  94%  Weight:  Height:        Wt Readings from Last 3 Encounters:  2018-09-16 158 lb 1.6 oz (71.7 kg)  12/27/17 160 lb 1.6 oz (72.6 kg)  12/07/17 162 lb (73.5 kg)    General:  Appears calm and comfortable Eyes: PERRL, normal lids, irises & conjunctiva ENT: grossly normal hearing, lips & tongue Neck: no LAD, masses or thyromegaly Cardiovascular: RRR, no m/r/g. No LE edema. Respiratory: +crackles dry Abdomen: soft, nontender Skin: no rash or induration seen on limited exam Musculoskeletal: grossly normal tone BUE/BLE Psychiatric: grossly normal mood and affect Neurologic: grossly non-focal.          Labs on Admission:  Basic Metabolic Panel: Recent Labs  Lab 12/27/17 1103 2018-09-16 1834 01/02/18 0358  NA  --  128* 131*  K 4.5 4.5 4.2  CL  --  95* 97*  CO2  --  23 26  GLUCOSE  --  145* 176*  BUN  --  19 18  CREATININE  --  0.67 0.62  CALCIUM  --  8.0* 8.2*   Liver Function Tests: No results for input(s): AST, ALT, ALKPHOS, BILITOT, PROT, ALBUMIN in the last 168 hours. No results for input(s): LIPASE, AMYLASE in the last 168 hours. No results for input(s): AMMONIA in the last 168 hours. CBC: Recent Labs  Lab 2018-09-16 1834  WBC 16.6*  HGB 11.0*  HCT 33.5*  MCV 90.2  PLT 333   Cardiac Enzymes: No results for input(s): CKTOTAL, CKMB, CKMBINDEX, TROPONINI in the last 168 hours.  BNP (last 3  results) Recent Labs    06/02/17 1516 12/22/17 1914  BNP 754.0* 2,117.0*    ProBNP (last 3 results) No results for input(s): PROBNP in the last 8760 hours.  CBG: Recent Labs  Lab 12/27/17 0813  GLUCAP 99    Radiological Exams on Admission: Dg Chest Port 1 View  Result Date: 08-04-2018 CLINICAL DATA:  Shortness of breath and weakness for 2 days. Cough for 1 month. EXAM: PORTABLE CHEST 1 VIEW COMPARISON:  12/22/2017 FINDINGS: The cardiomediastinal silhouette is unchanged. Aortic atherosclerosis is noted. A dual lead pacemaker remains in place. Marked pulmonary fibrosis is again noted. There is new superimposed hazy opacity diffusely throughout the right upper lobe, and there is also likely new mild opacity in the lateral left mid lung. No sizable pleural effusion or pneumothorax is identified. No acute osseous abnormality is seen. IMPRESSION: New right upper lobe and left mid lung opacities concerning for pneumonia superimposed on diffuse fibrosis. Electronically Signed   By: Sebastian AcheAllen  Grady M.D.   On: 009-18-2019 20:07    EKG: Independently reviewed.  Assessment/Plan Active Problems:   Acute respiratory failure (HCC)   1. Acute on Chronic Respiratory failure with hypoxia continue with oxygen therapy. Palliative care at this time may be the best option. Patient recently had NSTEMI which is likely also effecting his respiratory status at this time 2. Pulmonary fibrosis advanced disease supportive care not likely to benefit from steroids supportive care oxygen therapy 3. NSTEMI as per cardiology had PCI done   Code Status: DNR   Family Communication: Significant other in room  Disposition Plan: home   Time spent: 70min  I have personally obtained a history, examined the patient, evaluated laboratory and imaging results, formulated the assessment and plan and placed orders.  The Patient requires high complexity decision making for assessment and support. Total Time Spent  70min   Yevonne PaxSaadat A Aliyah Abeyta, MD [facility]CC[facility] Pulmonary Critical Care Medicine Sleep Medicine

## 2018-01-02 NOTE — Progress Notes (Signed)
[facility] - [place] at [facility]   PATIENT NAME: Walter Townsend    MR#:  [id number]  DATE OF BIRTH:  1925-05-23  SUBJECTIVE:  CHIEF COMPLAINT:  No chief complaint on file.  Continues to have shortness of breath and weakness.  Poor appetite.  Has been progressively declining over the last few months.  Was unable to get out of bed on his own.  Lives alone.  Ambulates with a walker.  REVIEW OF SYSTEMS:    Review of Systems  Constitutional: Positive for malaise/fatigue. Negative for chills and fever.  HENT: Negative for sore throat.   Eyes: Negative for blurred vision, double vision and pain.  Respiratory: Positive for cough and shortness of breath. Negative for hemoptysis and wheezing.   Cardiovascular: Negative for chest pain, palpitations, orthopnea and leg swelling.  Gastrointestinal: Negative for abdominal pain, constipation, diarrhea, heartburn, nausea and vomiting.  Genitourinary: Negative for dysuria and hematuria.  Musculoskeletal: Negative for back pain and joint pain.  Skin: Negative for rash.  Neurological: Positive for weakness. Negative for sensory change, speech change, focal weakness and headaches.  Endo/Heme/Allergies: Does not bruise/bleed easily.  Psychiatric/Behavioral: Negative for depression. The patient is not nervous/anxious.     DRUG ALLERGIES:   Allergies  Allergen Reactions  . Iodinated Diagnostic Agents Rash    PRE-MED WITH PREDNISONE    VITALS:  Blood pressure (!) 114/58, pulse 73, temperature 98.4 F (36.9 C), resp. rate 20, height 5' 8.5" (1.74 m), weight 71.7 kg (158 lb 1.6 oz), SpO2 94 %.  PHYSICAL EXAMINATION:   Physical Exam  GENERAL:  84 y.o.-year-old patient lying in the bed with conversational dyspnea EYES: Pupils equal, round, reactive to light and accommodation. No scleral icterus. Extraocular muscles intact.  HEENT: Head atraumatic, normocephalic. Oropharynx and nasopharynx clear.  NECK:  Supple, no jugular  venous distention. No thyroid enlargement, no tenderness.  LUNGS: Bilateral inspiratory and expiratory crackles CARDIOVASCULAR: S1, S2 normal. No murmurs, rubs, or gallops.  ABDOMEN: Soft, nontender, nondistended. Bowel sounds present. No organomegaly or mass.  EXTREMITIES: No cyanosis, clubbing .  Bilateral lower extremity edema NEUROLOGIC: Cranial nerves II through XII are intact. No focal Motor or sensory deficits b/l.   PSYCHIATRIC: The patient is alert and oriented x 3.  SKIN: No obvious rash, lesion, or ulcer.   LABORATORY PANEL:   CBC Recent Labs  Lab 01-07-18 1834  WBC 16.6*  HGB 11.0*  HCT 33.5*  PLT 333   ------------------------------------------------------------------------------------------------------------------ Chemistries  Recent Labs  Lab 01/02/18 0358  NA 131*  K 4.2  CL 97*  CO2 26  GLUCOSE 176*  BUN 18  CREATININE 0.62  CALCIUM 8.2*   ------------------------------------------------------------------------------------------------------------------  Cardiac Enzymes No results for input(s): TROPONINI in the last 168 hours. ------------------------------------------------------------------------------------------------------------------  RADIOLOGY:  Dg Chest Port 1 View  Result Date: 01-07-18 CLINICAL DATA:  Shortness of breath and weakness for 2 days. Cough for 1 month. EXAM: PORTABLE CHEST 1 VIEW COMPARISON:  12/22/2017 FINDINGS: The cardiomediastinal silhouette is unchanged. Aortic atherosclerosis is noted. A dual lead pacemaker remains in place. Marked pulmonary fibrosis is again noted. There is new superimposed hazy opacity diffusely throughout the right upper lobe, and there is also likely new mild opacity in the lateral left mid lung. No sizable pleural effusion or pneumothorax is identified. No acute osseous abnormality is seen. IMPRESSION: New right upper lobe and left mid lung opacities concerning for pneumonia superimposed on diffuse  fibrosis. Electronically Signed   By: Sebastian Ache M.D.   On: January 07, 2018  20:07     ASSESSMENT AND PLAN:   Thomes DinningWillard Marquart  is a 84 y.o. male with a known history of chronic pulmonary fibrosis, recent history of non-STEMI, paroxysmal atrial fibrillation, chronically lives on 2 L of oxygen is presenting with shortness of breath to the doctor's office.  Patient was requiring 4 L of oxygen and was found to be hypotensive.  Sent over to the hospital as a direct admit.  Patient usually takes 40 mg of Lasix at home.  #Acute on chronic hypoxic respiratory failure secondary to bilateral pneumonia over pulmonary fibrosis Start IV ceftriaxone.  IV steroids.  Nebulizers. Consult pulmonary.  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 Management plans discussed with the patient, family and they are in agreement.  CODE STATUS: DNR  DVT Prophylaxis: SCDs  TOTAL TIME TAKING CARE OF THIS PATIENT: 35 minutes.   POSSIBLE D/C IN 1-2 DAYS, DEPENDING ON CLINICAL CONDITION.  Molinda BailiffSrikar R Armend Hochstatter M.D on 01/02/2018 at 2:21 PM  Between 7am to 6pm - Pager - [phone]  After 6pm go to [email or web address] - password [email or web address] [facility]  [facility] [facility]  Office  [phone]  CC: Primary care physician; Gracelyn NurseJohnston, John D, MD  Note: This dictation was prepared with Dragon dictation along with smaller phrase technology. Any transcriptional errors that result from this process are unintentional.

## 2018-01-02 NOTE — Plan of Care (Signed)
VSS, free of falls during shift.  Denies pain.  Reports cough, received PRN PO Robitussin DM x2.  Per request, received PRN PO Trazodone 25mg  for sleep.  Reported nausea early in shift, received PRN PO Zofran 4mg  x1.  No other complaints overnight.  Son at bedside, bed in low position.  Call bell within reach, WCTM.

## 2018-01-03 ENCOUNTER — Inpatient Hospital Stay: Payer: Medicare Other

## 2018-01-03 ENCOUNTER — Encounter: Payer: Self-pay | Admitting: *Deleted

## 2018-01-03 LAB — CBC WITH DIFFERENTIAL/PLATELET
Basophils Absolute: 0.1 10*3/uL (ref 0–0.1)
Basophils Relative: 0 %
Eosinophils Absolute: 0 10*3/uL (ref 0–0.7)
Eosinophils Relative: 0 %
HEMATOCRIT: 33 % — AB (ref 40.0–52.0)
Hemoglobin: 10.8 g/dL — ABNORMAL LOW (ref 13.0–18.0)
LYMPHS PCT: 6 %
Lymphs Abs: 1.3 10*3/uL (ref 1.0–3.6)
MCH: 29.7 pg (ref 26.0–34.0)
MCHC: 32.8 g/dL (ref 32.0–36.0)
MCV: 90.3 fL (ref 80.0–100.0)
MONO ABS: 1.5 10*3/uL — AB (ref 0.2–1.0)
MONOS PCT: 6 %
Neutro Abs: 21.1 10*3/uL — ABNORMAL HIGH (ref 1.4–6.5)
Neutrophils Relative %: 88 %
Platelets: 406 10*3/uL (ref 150–440)
RBC: 3.65 MIL/uL — ABNORMAL LOW (ref 4.40–5.90)
RDW: 14.2 % (ref 11.5–14.5)
WBC: 24 10*3/uL — ABNORMAL HIGH (ref 3.8–10.6)

## 2018-01-03 LAB — LACTIC ACID, PLASMA: Lactic Acid, Venous: 3.7 mmol/L (ref 0.5–1.9)

## 2018-01-03 LAB — BASIC METABOLIC PANEL
ANION GAP: 14 (ref 5–15)
BUN: 28 mg/dL — ABNORMAL HIGH (ref 6–20)
CALCIUM: 8.2 mg/dL — AB (ref 8.9–10.3)
CO2: 21 mmol/L — AB (ref 22–32)
Chloride: 96 mmol/L — ABNORMAL LOW (ref 101–111)
Creatinine, Ser: 0.81 mg/dL (ref 0.61–1.24)
GFR calc Af Amer: >60 mL/min (ref >60)
GFR calc non Af Amer: >60 mL/min (ref >60)
GLUCOSE: 240 mg/dL — AB (ref 65–99)
POTASSIUM: 3.8 mmol/L (ref 3.5–5.1)
Sodium: 131 mmol/L — ABNORMAL LOW (ref 135–145)

## 2018-01-03 MED ORDER — PROCHLORPERAZINE EDISYLATE 5 MG/ML IJ SOLN
10.0000 mg | Freq: Two times a day (BID) | INTRAMUSCULAR | Status: DC | PRN
  Filled 2018-01-03: qty 2

## 2018-01-03 MED ORDER — SODIUM CHLORIDE 0.9 % IV SOLN: 250.0000 mL | INTRAVENOUS | Status: DC | PRN

## 2018-01-03 MED ORDER — LORAZEPAM 2 MG/ML IJ SOLN
1.0000 mg | INTRAMUSCULAR | Status: DC | PRN
  Administered 2018-01-03: 20:00:00 1 mg via INTRAVENOUS
  Filled 2018-01-03: qty 1

## 2018-01-03 MED ORDER — IPRATROPIUM-ALBUTEROL 0.5-2.5 (3) MG/3ML IN SOLN
3.0000 mL | Freq: Four times a day (QID) | RESPIRATORY_TRACT | Status: DC
  Administered 2018-01-03: 08:00:00 3 mL via RESPIRATORY_TRACT
  Filled 2018-01-03: qty 3

## 2018-01-03 MED ORDER — ALBUTEROL SULFATE (2.5 MG/3ML) 0.083% IN NEBU: 2.5000 mg | INHALATION_SOLUTION | RESPIRATORY_TRACT | Status: DC | PRN

## 2018-01-03 MED ORDER — MORPHINE SULFATE (CONCENTRATE) 10 MG/0.5ML PO SOLN
5.0000 mg | ORAL | Status: DC | PRN
  Administered 2018-01-03 (×2): 5 mg via ORAL
  Filled 2018-01-03 (×2): qty 1

## 2018-01-03 MED ORDER — MORPHINE BOLUS VIA INFUSION
2.0000 mg | Freq: Once | INTRAVENOUS | Status: AC
  Administered 2018-01-03: 2 mg via INTRAVENOUS
  Filled 2018-01-03: qty 2

## 2018-01-03 MED ORDER — LORAZEPAM 1 MG PO TABS
1.0000 mg | ORAL_TABLET | ORAL | Status: DC | PRN
  Administered 2018-01-03: 11:00:00 1 mg via ORAL
  Filled 2018-01-03: qty 1

## 2018-01-03 MED ORDER — HALOPERIDOL 0.5 MG PO TABS
0.5000 mg | ORAL_TABLET | ORAL | Status: DC | PRN
  Filled 2018-01-03: qty 1

## 2018-01-03 MED ORDER — PROCHLORPERAZINE MALEATE 10 MG PO TABS
10.0000 mg | ORAL_TABLET | Freq: Four times a day (QID) | ORAL | Status: DC | PRN
  Filled 2018-01-03: qty 1

## 2018-01-03 MED ORDER — MORPHINE SULFATE (PF) 4 MG/ML IV SOLN
4.0000 mg | INTRAVENOUS | Status: DC | PRN
  Administered 2018-01-03: 11:00:00 4 mg via INTRAVENOUS
  Filled 2018-01-03 (×2): qty 1

## 2018-01-03 MED ORDER — BENZONATATE 100 MG PO CAPS: 100.0000 mg | ORAL_CAPSULE | Freq: Three times a day (TID) | ORAL | Status: DC | PRN

## 2018-01-03 MED ORDER — LORAZEPAM 2 MG/ML IJ SOLN: 0.5000 mg | Freq: Once | INTRAMUSCULAR | Status: AC

## 2018-01-03 MED ORDER — SODIUM CHLORIDE 0.9% FLUSH: 3.0000 mL | Freq: Two times a day (BID) | INTRAVENOUS | Status: DC

## 2018-01-03 MED ORDER — SODIUM CHLORIDE 0.9 % IV SOLN
6.0000 mg/h | INTRAVENOUS | Status: DC
  Administered 2018-01-03: 21:00:00 8 mg/h via INTRAVENOUS
  Administered 2018-01-03: 3 mg/h via INTRAVENOUS
  Filled 2018-01-03: qty 10

## 2018-01-03 MED ORDER — LORAZEPAM 2 MG/ML PO CONC: 1.0000 mg | ORAL | Status: DC | PRN

## 2018-01-03 MED ORDER — HALOPERIDOL LACTATE 5 MG/ML IJ SOLN
0.5000 mg | INTRAMUSCULAR | Status: DC | PRN
  Filled 2018-01-03: qty 1

## 2018-01-03 MED ORDER — ATROPINE SULFATE 1 % OP SOLN
4.0000 [drp] | OPHTHALMIC | Status: DC | PRN
  Filled 2018-01-03 (×2): qty 2

## 2018-01-03 MED ORDER — SODIUM CHLORIDE 0.9% FLUSH: 3.0000 mL | INTRAVENOUS | Status: DC | PRN

## 2018-01-03 MED ORDER — HALOPERIDOL LACTATE 2 MG/ML PO CONC
0.5000 mg | ORAL | Status: DC | PRN
  Filled 2018-01-03: qty 0.3

## 2018-01-03 MED ORDER — LORAZEPAM 2 MG/ML IJ SOLN
INTRAMUSCULAR | Status: AC
  Administered 2018-01-03: 1 mg
  Filled 2018-01-03: qty 1

## 2018-01-03 MED ORDER — FUROSEMIDE 10 MG/ML IJ SOLN
60.0000 mg | Freq: Two times a day (BID) | INTRAMUSCULAR | Status: DC
  Administered 2018-01-03: 08:00:00 60 mg via INTRAVENOUS
  Filled 2018-01-03: qty 6

## 2018-01-03 MED ORDER — GLYCOPYRROLATE 0.2 MG/ML IJ SOLN
0.2000 mg | INTRAMUSCULAR | Status: DC | PRN
  Administered 2018-01-03 – 2018-01-04 (×3): 0.2 mg via SUBCUTANEOUS
  Filled 2018-01-03 (×6): qty 1

## 2018-01-03 MED ORDER — PROCHLORPERAZINE 25 MG RE SUPP
25.0000 mg | Freq: Two times a day (BID) | RECTAL | Status: DC | PRN
Start: 2018-01-03 — End: 2018-01-04
  Filled 2018-01-03: qty 1

## 2018-01-03 MED ORDER — MORPHINE SULFATE (CONCENTRATE) 10 MG/0.5ML PO SOLN: 10.0000 mg | ORAL | Status: DC | PRN

## 2018-01-03 NOTE — Progress Notes (Addendum)
[facility] - [place] [facility]   PATIENT NAME: Walter Townsend    MR#:  [id number]  DATE OF BIRTH:  November 11, 1925  SUBJECTIVE:  CHIEF COMPLAINT:  No chief complaint on file.  Patient has worsened overnight.  On nonrebreather.  Severely short of breath.  Saturations 87% on 6 L. Afebrile.  Granddaughter at bedside.  REVIEW OF SYSTEMS:    Review of Systems  Constitutional: Positive for malaise/fatigue. Negative for chills and fever.  HENT: Negative for sore throat.   Eyes: Negative for blurred vision, double vision and pain.  Respiratory: Positive for cough and shortness of breath. Negative for hemoptysis and wheezing.   Cardiovascular: Negative for chest pain, palpitations, orthopnea and leg swelling.  Gastrointestinal: Negative for abdominal pain, constipation, diarrhea, heartburn, nausea and vomiting.  Genitourinary: Negative for dysuria and hematuria.  Musculoskeletal: Negative for back pain and joint pain.  Skin: Negative for rash.  Neurological: Positive for weakness. Negative for sensory change, speech change, focal weakness and headaches.  Endo/Heme/Allergies: Does not bruise/bleed easily.  Psychiatric/Behavioral: Negative for depression. The patient is not nervous/anxious.     DRUG ALLERGIES:   Allergies  Allergen Reactions  . Iodinated Diagnostic Agents Rash    PRE-MED WITH PREDNISONE    VITALS:  Blood pressure 120/76, pulse 94, temperature 97.6 F (36.4 C), temperature source Axillary, resp. rate (!) 34, height 5' 8.5" (1.74 m), weight 71.7 kg (158 lb 1.6 oz), SpO2 98 %.  PHYSICAL EXAMINATION:   Physical Exam  GENERAL:  84 y.o.-year-old patient lying in the bed with severe respiratory distress. EYES: Pupils equal, round, reactive to light and accommodation. No scleral icterus. Extraocular muscles intact.  HEENT: Head atraumatic, normocephalic. Oropharynx and nasopharynx clear.  NECK:  Supple, no jugular venous distention. No thyroid  enlargement, no tenderness.  LUNGS: Bilateral coarse breath sounds, wheezing CARDIOVASCULAR: S1, S2 normal. No murmurs, rubs, or gallops.  ABDOMEN: Soft, nontender, nondistended. Bowel sounds present. No organomegaly or mass.  EXTREMITIES: No cyanosis, clubbing .  Bilateral lower extremity edema NEUROLOGIC: Cranial nerves II through XII are intact. No focal Motor or sensory deficits b/l.   PSYCHIATRIC: The patient is alert and oriented x 3.  Anxious SKIN: No obvious rash, lesion, or ulcer.   LABORATORY PANEL:   CBC Recent Labs  Lab 12/31/2017 1834  WBC 16.6*  HGB 11.0*  HCT 33.5*  PLT 333   ------------------------------------------------------------------------------------------------------------------ Chemistries  Recent Labs  Lab 01/02/18 0358  NA 131*  K 4.2  CL 97*  CO2 26  GLUCOSE 176*  BUN 18  CREATININE 0.62  CALCIUM 8.2*   ------------------------------------------------------------------------------------------------------------------  Cardiac Enzymes No results for input(s): TROPONINI in the last 168 hours. ------------------------------------------------------------------------------------------------------------------  RADIOLOGY:  Dg Chest Port 1 View  Result Date: 01/03/2018 CLINICAL DATA:  Acute onset of shortness of breath. EXAM: PORTABLE CHEST 1 VIEW COMPARISON:  Radiograph 12/31/2017.  CT 06/02/2017 FINDINGS: Left-sided pacemaker remains in place. Unchanged heart size and mediastinal contours. Progressive interstitial opacities throughout the right hemithorax. Left mid lung opacity on prior exam partially obscured by battery pack from pacemaker. Background pulmonary fibrosis. No confluent airspace disease. No pleural effusion or pneumothorax. IMPRESSION: Progressive interstitial opacities throughout the right hemithorax, pneumonia versus pulmonary edema superimposed on chronic interstitial lung disease. Left midlung opacity on prior exam currently obscured  by pacemaker. Electronically Signed   By: Rubye OaksMelanie  Ehinger M.D.   On: 01/03/2018 05:53   Dg Chest Port 1 View  Result Date: 01/10/2018 CLINICAL DATA:  Shortness of breath and weakness for  2 days. Cough for 1 month. EXAM: PORTABLE CHEST 1 VIEW COMPARISON:  12/22/2017 FINDINGS: The cardiomediastinal silhouette is unchanged. Aortic atherosclerosis is noted. A dual lead pacemaker remains in place. Marked pulmonary fibrosis is again noted. There is new superimposed hazy opacity diffusely throughout the right upper lobe, and there is also likely new mild opacity in the lateral left mid lung. No sizable pleural effusion or pneumothorax is identified. No acute osseous abnormality is seen. IMPRESSION: New right upper lobe and left mid lung opacities concerning for pneumonia superimposed on diffuse fibrosis. Electronically Signed   By: Sebastian Ache M.D.   On: 01/08/2018 20:07     ASSESSMENT AND PLAN:   Walter Townsend  is a 84 y.o. male with a known history of chronic pulmonary fibrosis, recent history of non-STEMI, paroxysmal atrial fibrillation, chronically lives on 2 L of oxygen is presenting with shortness of breath to the doctor's office.  Patient was requiring 4 L of oxygen and was found to be hypotensive.  Sent over to the hospital as a direct admit.  Patient usually takes 40 mg of Lasix at home.  #Acute on chronic hypoxic respiratory failure secondary to bilateral pneumonia over pulmonary fibrosis On IV ceftriaxone.  IV steroids.  Nebulizers. Stat breathing treatment.  Gave 0.5 of Ativan IV 1 time. Chest x-ray checked overnight shows worsening infiltrates. Patient given 1 dose of IV Lasix for possible pulmonary edema. Continues to worsen. Patient will need BiPAP support and transferred to ICU.  Discussed with Dr. Senaida Ores ICU.  She is suggesting morphine.  Requested patient to be evaluated by Dr. Peggye Pitt on the floor.  Will start patient on high flow nasal cannula.  Ordered morphine.  High risk  for deterioration and death.  Patient is DNR.  If no improvement will have to transfer to ICU.  #Acute on chronic systolic congestive heart failure.  Ejection fraction 35%.  Started IV Lasix twice daily.  Monitor input and output and blood work.  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 Management plans discussed with the patient, family and they are in agreement.  CODE STATUS: DNR  DVT Prophylaxis: SCDs  TOTAL CC TIME TAKING CARE OF THIS PATIENT: 45 minutes.   POSSIBLE D/C IN 1-2 DAYS, DEPENDING ON CLINICAL CONDITION.  Orie Fisherman M.D on 01/03/2018 at 9:14 AM  Between 7am to 6pm - Pager - [phone]  After 6pm go to [email or web address] - password [email or web address]  [facility] [place] [facility]  Office  [phone]  CC: Primary care physician; Gracelyn Nurse, MD  Note: This dictation was prepared with Dragon dictation along with smaller phrase technology. Any transcriptional errors that result from this process are unintentional.

## 2018-01-03 NOTE — Plan of Care (Signed)
Desatted to 77% on 2LNC, received PRN Albuterol neb, increased flow to 6L, satting 90-92%.  Dr. Katheren ShamsSalary paged, ordered CXR, ABG.  VSS otherwise, free of falls during shift.  Received PRN Robitussin DM x3 for cough, PRN PO Trazodone 25mg  for sleep.  Denies pain, no other needs overnight.  Granddaughter at bedside, bed in low position.  Call bell within reach, WCTM.

## 2018-01-03 NOTE — Plan of Care (Signed)
  Education: Knowledge of General Education information will improve 01/03/2018 2045 - Not Progressing by Donnel SaxonKennedy, Dianelly Ferran L, RN 01/03/2018 2037 - Progressing by Donnel SaxonKennedy, Glender Augusta L, RN   Health Behavior/Discharge Planning: Ability to manage health-related needs will improve 01/03/2018 2045 - Not Progressing by Donnel SaxonKennedy, Alois Colgan L, RN 01/03/2018 2037 - Progressing by Donnel SaxonKennedy, Kanav Kazmierczak L, RN   Clinical Measurements: Ability to maintain clinical measurements within normal limits will improve 01/03/2018 2045 - Not Progressing by Donnel SaxonKennedy, Jotham Ahn L, RN 01/03/2018 2037 - Progressing by Donnel SaxonKennedy, Renuka Farfan L, RN Will remain free from infection 01/03/2018 2045 - Not Progressing by Donnel SaxonKennedy, Lulani Bour L, RN 01/03/2018 2037 - Progressing by Donnel SaxonKennedy, Edsel Shives L, RN Diagnostic test results will improve 01/03/2018 2045 - Not Progressing by Donnel SaxonKennedy, Rajanae Mantia L, RN 01/03/2018 2037 - Progressing by Donnel SaxonKennedy, Aviyanna Colbaugh L, RN Respiratory complications will improve 01/03/2018 2045 - Not Progressing by Donnel SaxonKennedy, Janeah Kovacich L, RN 01/03/2018 2037 - Progressing by Donnel SaxonKennedy, Angi Goodell L, RN Cardiovascular complication will be avoided 01/03/2018 2045 - Not Progressing by Donnel SaxonKennedy, Shalayne Leach L, RN 01/03/2018 2037 - Progressing by Donnel SaxonKennedy, Jane Birkel L, RN   Clinical Measurements: Diagnostic test results will improve 01/03/2018 2045 - Not Progressing by Donnel SaxonKennedy, Tashonna Descoteaux L, RN 01/03/2018 2037 - Progressing by Donnel SaxonKennedy, Linell Shawn L, RN   Clinical Measurements: Respiratory complications will improve 01/03/2018 2045 - Not Progressing by Donnel SaxonKennedy, Porchia Sinkler L, RN 01/03/2018 2037 - Progressing by Donnel SaxonKennedy, Markell Schrier L, RN   Clinical Measurements: Cardiovascular complication will be avoided 01/03/2018 2045 - Not Progressing by Donnel SaxonKennedy, Hagar Sadiq L, RN 01/03/2018 2037 - Progressing by Donnel SaxonKennedy, Danyale Ridinger L, RN   Activity: Risk for activity intolerance will decrease 01/03/2018 2045 - Not Progressing by Donnel SaxonKennedy, Judene Logue L,  RN 01/03/2018 2037 - Progressing by Donnel SaxonKennedy, Hasnain Manheim L, RN   Nutrition: Adequate nutrition will be maintained 01/03/2018 2045 - Not Progressing by Donnel SaxonKennedy, Chestine Belknap L, RN 01/03/2018 2037 - Progressing by Donnel SaxonKennedy, Christorpher Hisaw L, RN   Coping: Level of anxiety will decrease 01/03/2018 2045 - Not Progressing by Donnel SaxonKennedy, Zoelle Markus L, RN 01/03/2018 2037 - Progressing by Donnel SaxonKennedy, Noa Galvao L, RN   Elimination: Will not experience complications related to bowel motility 01/03/2018 2045 - Not Progressing by Donnel SaxonKennedy, Lucelia Lacey L, RN 01/03/2018 2037 - Progressing by Donnel SaxonKennedy, Hazael Olveda L, RN Will not experience complications related to urinary retention 01/03/2018 2045 - Not Progressing by Donnel SaxonKennedy, Stevana Dufner L, RN 01/03/2018 2037 - Progressing by Donnel SaxonKennedy, Nacole Fluhr L, RN   Pain Managment: General experience of comfort will improve 01/03/2018 2045 - Not Progressing by Donnel SaxonKennedy, Ikeya Brockel L, RN 01/03/2018 2037 - Progressing by Donnel SaxonKennedy, Tailey Top L, RN   Safety: Ability to remain free from injury will improve 01/03/2018 2045 - Not Progressing by Donnel SaxonKennedy, Tricha Ruggirello L, RN 01/03/2018 2037 - Progressing by Donnel SaxonKennedy, Davit Vassar L, RN   Skin Integrity: Risk for impaired skin integrity will decrease 01/03/2018 2045 - Not Progressing by Donnel SaxonKennedy, Tahira Olivarez L, RN 01/03/2018 2037 - Progressing by Donnel SaxonKennedy, Mishka Stegemann L, RN

## 2018-01-03 NOTE — Progress Notes (Signed)
Patient complaining of SOB, 02 sats 80% on 6L, notified respiratory and Dr. Elpidio AnisSudini. Non-rebreather applied to patient, sats up to 95%. Dr. Elpidio AnisSudini at bedside, respiratory attempted patient on high flow nasal cannula, patient unable to tolerate, refusing high flow. Respiratory reapplied non rebreather, sats at 92%. Administered  Morphine 5mg  X3. After Sudini discussed with family, family decided with Dr. Elpidio AnisSudini to make the patient comfort care. Patient on nonrebreather currently, Dr. Elpidio AnisSudini also ordered morphine drip for shortness of breath and comfort.

## 2018-01-03 NOTE — Progress Notes (Signed)
Patient reevaluated and continues to be  in respiratory distress on nonrebreather with saturations 99% and persistent diffuse wheezing and coarse breath sounds. Patient woke up on calling his name.  He feels a little better after the morphine dose he received. We waited for patient's son to arrive.  I discussed with the son, daughter-in-law and granddaughter regarding patient's continued deterioration in spite of aggressive treatment with IV antibiotics, IV steroids scheduled nebulizers and oxygen support.  Explained the discussion with Dr. Peggye Pittichards of ICU.  We discussed regarding patient's DNR and DNI.  Also his refusal of high flow nasal cannula and how uncomfortable BiPAP could be.  Initially family contemplated using BiPAP to see if that helps.  After discussing with patient and his continued respiratory distress patient has wished to keep him comfortable.  Does not want a BiPAP.  He is requesting that we leave him on the nonrebreather.  Family is requesting a Foley catheter and orders entered.  I have explained regarding comfort measures.  Stop IV antibiotics and steroids.  No further blood work or new IV lines.  Patient is DNR.  RN may pronounce.  Added morphine and Ativan. Critical care time spent 40 minutes

## 2018-01-03 NOTE — Progress Notes (Signed)
Nutrition Brief Note  Patient identified to be seen for Malnutrition Screening Tool (MST). Chart reviewed and discussed with RN. Patient now transitioning to comfort care.   No nutrition interventions warranted at this time.  Please consult RD as needed.   Evart Mcdonnell, MS, RD, LDN Office: [phone] Pager: [phone] After Hours/Weekend Pager: [phone]    

## 2018-01-03 NOTE — Plan of Care (Signed)
  Education: Knowledge of General Education information will improve 01/03/2018 2037 - Progressing by Donnel SaxonKennedy, Bjorn Hallas L, RN   Health Behavior/Discharge Planning: Ability to manage health-related needs will improve 01/03/2018 2037 - Progressing by Donnel SaxonKennedy, Damier Disano L, RN   Clinical Measurements: Ability to maintain clinical measurements within normal limits will improve 01/03/2018 2037 - Progressing by Donnel SaxonKennedy, Caid Radin L, RN Will remain free from infection 01/03/2018 2037 - Progressing by Donnel SaxonKennedy, Esias Mory L, RN Diagnostic test results will improve 01/03/2018 2037 - Progressing by Donnel SaxonKennedy, Harlyn Rathmann L, RN Respiratory complications will improve 01/03/2018 2037 - Progressing by Donnel SaxonKennedy, Sandria Mcenroe L, RN Cardiovascular complication will be avoided 01/03/2018 2037 - Progressing by Donnel SaxonKennedy, Letrice Pollok L, RN   Activity: Risk for activity intolerance will decrease 01/03/2018 2037 - Progressing by Donnel SaxonKennedy, Juni Glaab L, RN   Nutrition: Adequate nutrition will be maintained 01/03/2018 2037 - Progressing by Donnel SaxonKennedy, Makesha Belitz L, RN   Coping: Level of anxiety will decrease 01/03/2018 2037 - Progressing by Donnel SaxonKennedy, Jaishon Krisher L, RN   Elimination: Will not experience complications related to bowel motility 01/03/2018 2037 - Progressing by Donnel SaxonKennedy, Chistina Roston L, RN Will not experience complications related to urinary retention 01/03/2018 2037 - Progressing by Donnel SaxonKennedy, Mistie Adney L, RN   Pain Managment: General experience of comfort will improve 01/03/2018 2037 - Progressing by Donnel SaxonKennedy, Pauleen Goleman L, RN   Safety: Ability to remain free from injury will improve 01/03/2018 2037 - Progressing by Donnel SaxonKennedy, Maxen Rowland L, RN   Skin Integrity: Risk for impaired skin integrity will decrease 01/03/2018 2037 - Progressing by Donnel SaxonKennedy, [place] Souter L, RN   Skin Integrity: Risk for impaired skin integrity will decrease 01/03/2018 2037 - Progressing by Donnel SaxonKennedy, Kasarah Sitts L, RN

## 2018-01-04 LAB — BLOOD GAS, ARTERIAL
Acid-Base Excess: 2.4 mmol/L — ABNORMAL HIGH (ref 0.0–2.0)
Bicarbonate: 26.4 mmol/L (ref 20.0–28.0)
FIO2: 0.44
O2 SAT: 94.4 %
PCO2 ART: 38 mmHg (ref 32.0–48.0)
PO2 ART: 69 mmHg — AB (ref 83.0–108.0)
Patient temperature: 37
pH, Arterial: 7.45 (ref 7.350–7.450)

## 2018-01-05 ENCOUNTER — Ambulatory Visit: Payer: Medicare Other | Admitting: Cardiovascular Disease

## 2018-01-14 ENCOUNTER — Ambulatory Visit: Payer: Medicare Other | Admitting: Pulmonary Disease

## 2018-01-15 NOTE — Death Summary Note (Signed)
DEATH SUMMARY   Patient Details  Name: Walter Townsend Walter Chinn MRN: [id number] DOB: 1925/07/03  Admission/Discharge Information   Admit Date:  12/20/2017  Date of Death: Date of Death: 01-18-2018  Time of Death: Time of Death: 0308  Length of Stay: 3  Referring Physician: Gracelyn NurseJohnston, John D, MD   Reason(s) for Hospitalization   HISTORY OF PRESENT ILLNESS:  Walter Townsend  is a 84 y.o. male with a known history of chronic pulmonary fibrosis, recent history of non-STEMI, paroxysmal atrial fibrillation, chronically lives on 2 Walter of oxygen is presenting with shortness of breath to the doctor's office.  Patient was requiring 4 Walter of oxygen and was found to be hypotensive.  Sent over to the hospital as a direct admit.  Patient usually takes 40 mg of Lasix at home.  Getting short of breath with minimal exertion, denies any chest pain but feeling tight in his chest.  Daughter-in-law at bedside  Diagnoses  Preliminary cause of death:  Secondary Diagnoses (including complications and co-morbidities):  Active Problems:   Acute respiratory failure [facility](HCC)   Brief Hospital Course (including significant findings, care, treatment, and services provided and events leading to death)  Walter Townsend Walter Townsend is a 84 y.o. year old male with history of CAD, chronic pulmonary fibrosis, paroxysmal atrial fibrillation and recent non-ST elevation MI presented to the hospital due to bilateral pneumonia with acute on chronic respiratory failure and admitted to the medical service.  Patient was started on broad-spectrum antibiotics, IV steroids, nebulizers.  In spite of aggressive treatment he continued to deteriorate.  Pulmonary and intensivist were consulted. Due to continued worsening family meeting was held with patient involved.  Decision was made to transition patient to comfort measures.  Morphine drip was started.  Patient was pronounced dead at 3.08 AM on 11-08-18.    Pertinent Labs and Studies  Significant Diagnostic  Studies Dg Chest 2 View  Result Date: 12/22/2017 CLINICAL DATA:  84 year old male with shortness of breath. Initial encounter. EXAM: CHEST  2 VIEW COMPARISON:  12/01/2017 and 06/02/2017 chest x-ray. FINDINGS: Marked pulmonary fibrosis similar to prior exam. No new segmental consolidation or pulmonary edema. Cardiomegaly with sequential pacemaker in place. Calcified aorta. No acute osseous abnormality. IMPRESSION: Marked pulmonary fibrosis similar to prior exam. No new segmental consolidation or pulmonary edema. Cardiomegaly with sequential pacemaker in place. Aortic Atherosclerosis (ICD10-I70.0). Electronically Signed   By: Lacy DuverneySteven  Olson M.D.   On: 12/22/2017 20:05   Dg Chest Port 1 View  Result Date: 01/03/2018 CLINICAL DATA:  Acute onset of shortness of breath. EXAM: PORTABLE CHEST 1 VIEW COMPARISON:  Radiograph 12/28/2017.  CT 06/02/2017 FINDINGS: Left-sided pacemaker remains in place. Unchanged heart size and mediastinal contours. Progressive interstitial opacities throughout the right hemithorax. Left mid lung opacity on prior exam partially obscured by battery pack from pacemaker. Background pulmonary fibrosis. No confluent airspace disease. No pleural effusion or pneumothorax. IMPRESSION: Progressive interstitial opacities throughout the right hemithorax, pneumonia versus pulmonary edema superimposed on chronic interstitial lung disease. Left midlung opacity on prior exam currently obscured by pacemaker. Electronically Signed   By: Rubye OaksMelanie  Ehinger M.D.   On: 01/03/2018 05:53   Dg Chest Port 1 View  Result Date: 01/02/2018 CLINICAL DATA:  Shortness of breath and weakness for 2 days. Cough for 1 month. EXAM: PORTABLE CHEST 1 VIEW COMPARISON:  12/22/2017 FINDINGS: The cardiomediastinal silhouette is unchanged. Aortic atherosclerosis is noted. A dual lead pacemaker remains in place. Marked pulmonary fibrosis is again noted. There is new superimposed hazy opacity diffusely  throughout the right upper  lobe, and there is also likely new mild opacity in the lateral left mid lung. No sizable pleural effusion or pneumothorax is identified. No acute osseous abnormality is seen. IMPRESSION: New right upper lobe and left mid lung opacities concerning for pneumonia superimposed on diffuse fibrosis. Electronically Signed   By: Sebastian Ache M.D.   On: 01-10-2018 20:07    Microbiology No results found for this or any previous visit (from the past 240 hour(s)).  Lab Basic Metabolic Panel: No results for input(s): NA, K, CL, CO2, GLUCOSE, BUN, CREATININE, CALCIUM, MG, PHOS in the last 168 hours. Liver Function Tests: No results for input(s): AST, ALT, ALKPHOS, BILITOT, PROT, ALBUMIN in the last 168 hours. No results for input(s): LIPASE, AMYLASE in the last 168 hours. No results for input(s): AMMONIA in the last 168 hours. CBC: No results for input(s): WBC, NEUTROABS, HGB, HCT, MCV, PLT in the last 168 hours. Cardiac Enzymes: No results for input(s): CKTOTAL, CKMB, CKMBINDEX, TROPONINI in the last 168 hours. Sepsis Labs: No results for input(s): PROCALCITON, WBC, LATICACIDVEN in the last 168 hours.  Procedures/Operations     Deionna Marcantonio R Channel Papandrea 01/12/2018, 4:48 PM

## 2018-01-15 DEATH — deceased

## 2018-01-20 IMAGING — CT CT HEAD W/O CM
3 series · 15 of 47 positions shown, 18 images · non-contrast
Comparison: None.

CLINICAL DATA: Onset of dizziness this morning.  No known injury.

EXAM:
CT HEAD WITHOUT CONTRAST
TECHNIQUE: Contiguous axial images were obtained from the base of the skull
through the vertex without intravenous contrast.

[Series 2: head wo · axial · 0.42mm/px · z∈[+232,+357]mm · 9 of 30 slices shown, 12 images]
[im 3/30  brain]
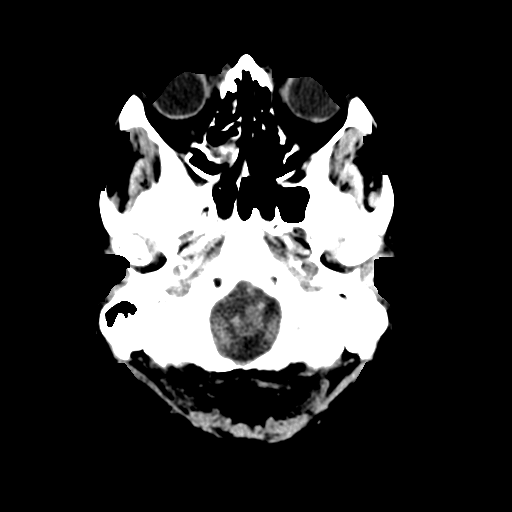
[im 3/30  bone]
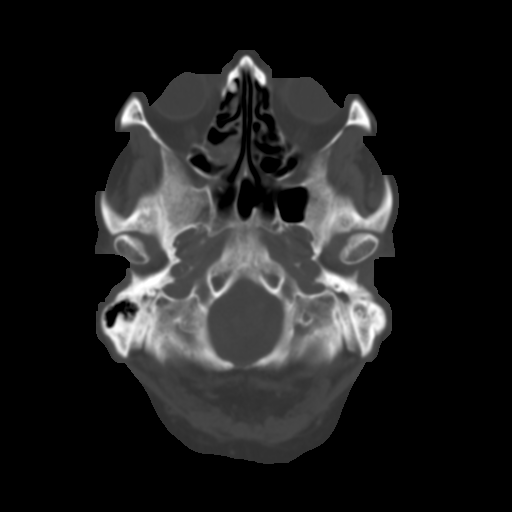
[im 6/30  brain]
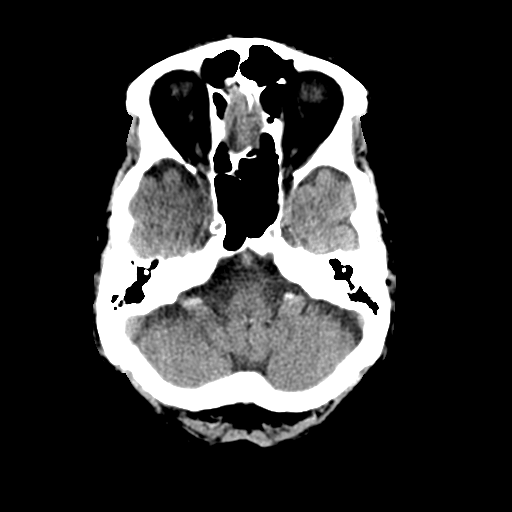
[im 9/30  brain]
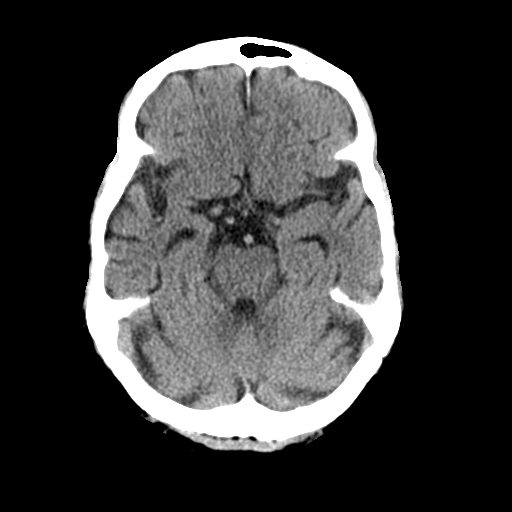
[im 12/30  brain]
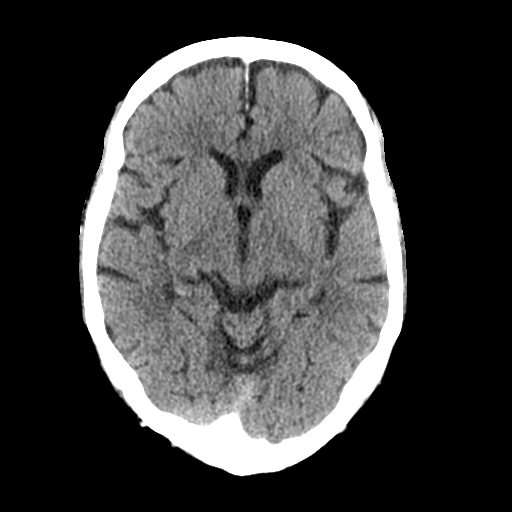
[im 16/30  brain]
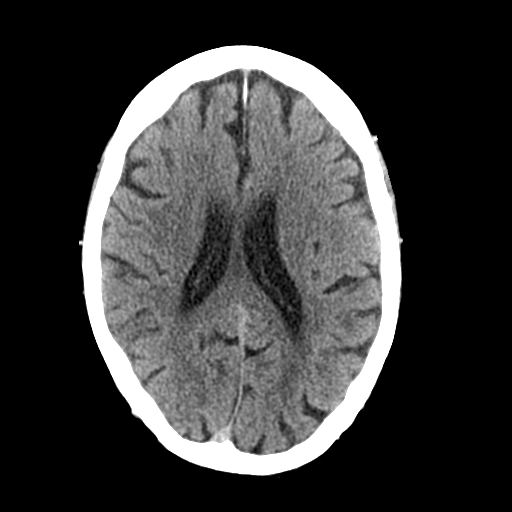
[im 16/30  bone]
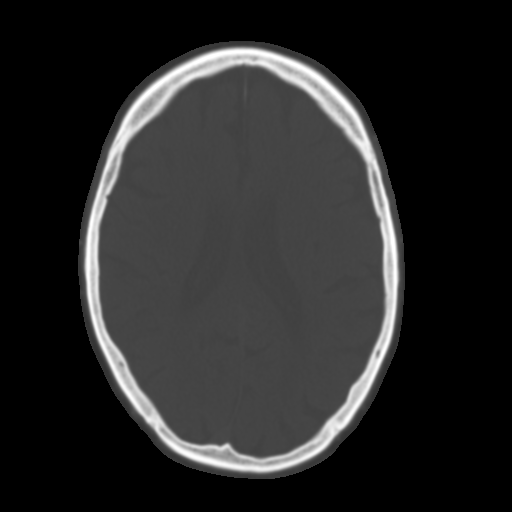
[im 19/30  brain]
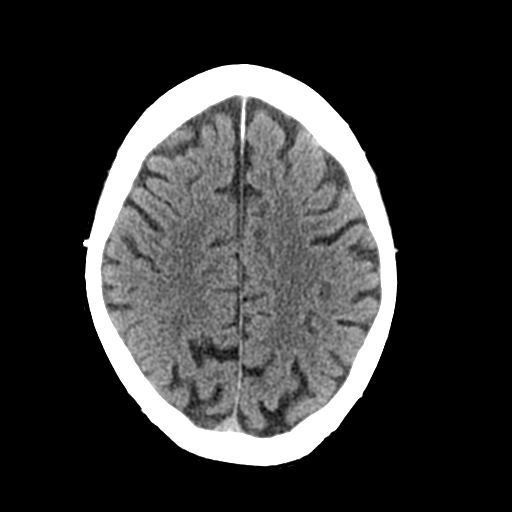
[im 22/30  brain]
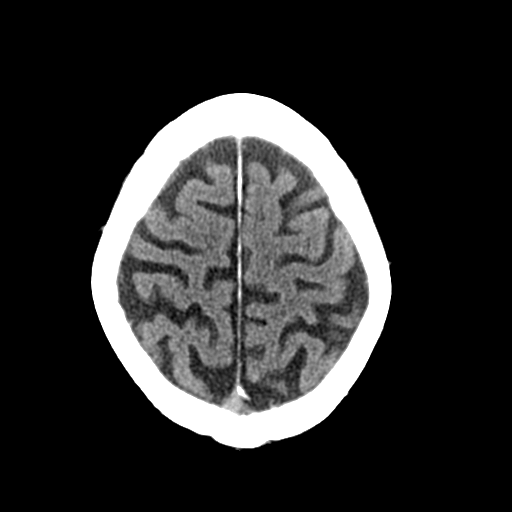
[im 25/30  brain]
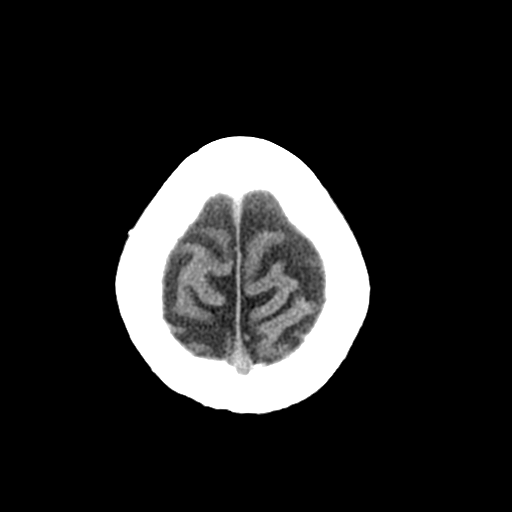
[im 28/30  brain]
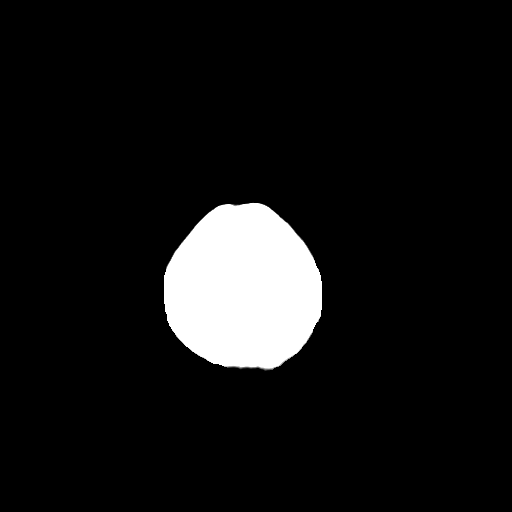
[im 28/30  bone]
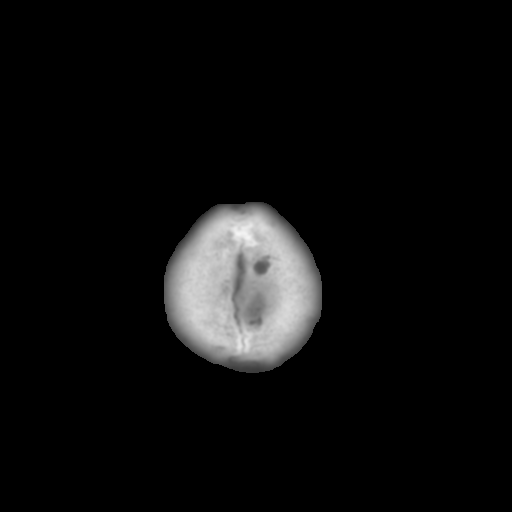

[Series 4: coronal soft tissue · coronal · 0.32mm/px · 3 of 66 slices shown]
[im 22/66  brain]
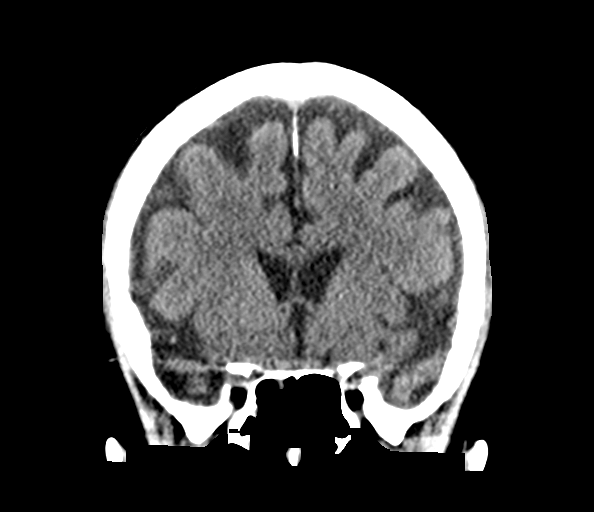
[im 29/66  brain]
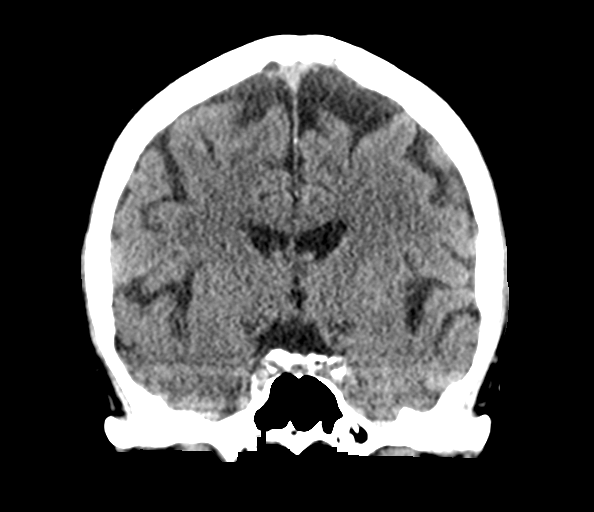
[im 37/66  brain]
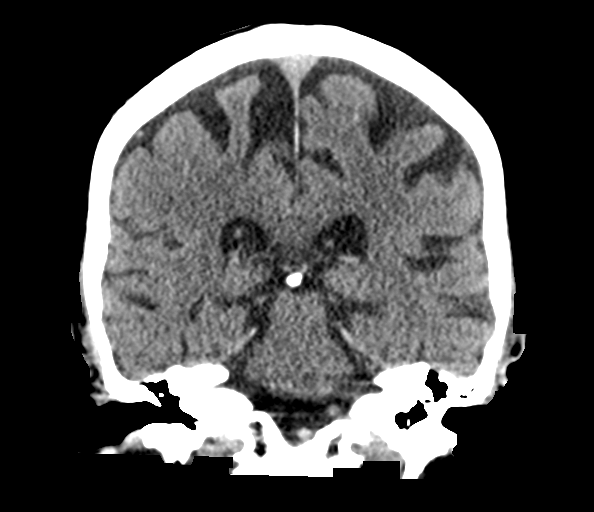

[Series 5: sagittal soft tissue · sagittal · 0.32mm/px · 3 of 51 slices shown]
[im 17/51  brain]
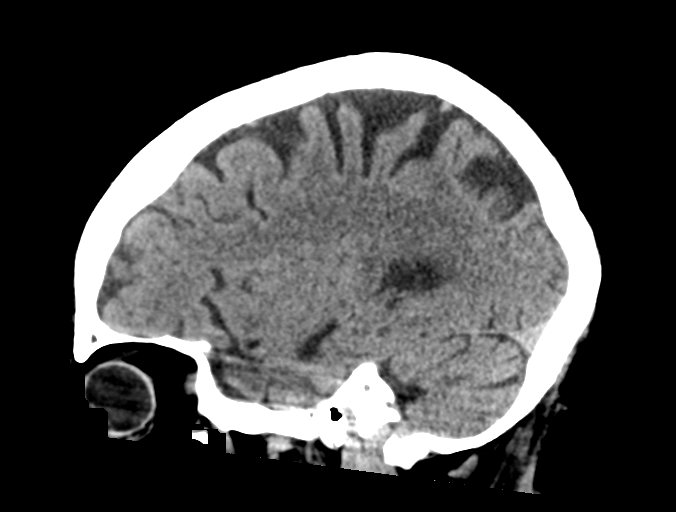
[im 26/51  brain]
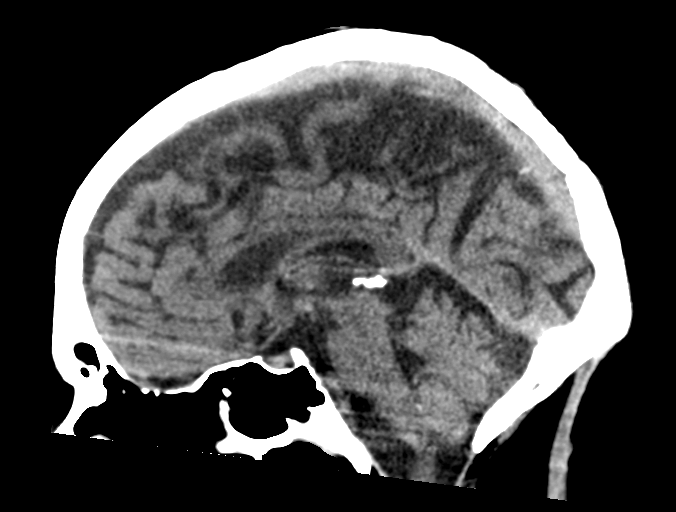
[im 34/51  brain]
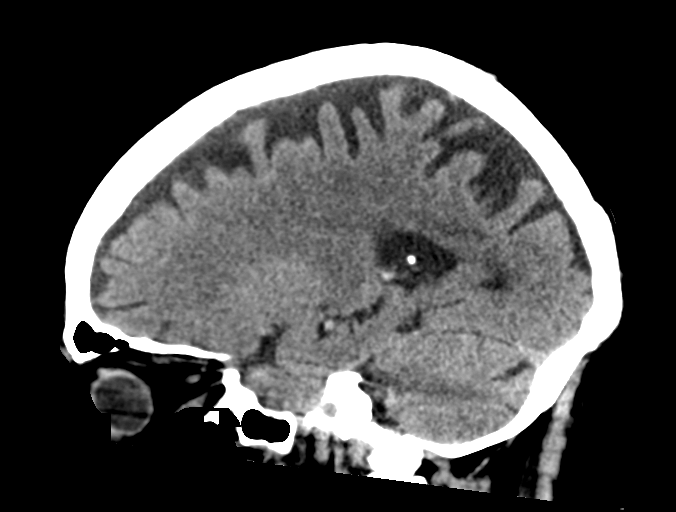

[15 of 47 positions shown; findings below may reference images not displayed]

FINDINGS: Brain: No acute abnormality including hemorrhage, infarct, mass
lesion, mass effect, midline shift or abnormal extra-axial fluid
collection. No hydrocephalus or pneumocephalus. Mild atrophy and
chronic microvascular ischemic change noted.

Vascular: Atherosclerotic vascular disease is seen.

Skull: Intact.

Sinuses/Orbits: Mild ethmoid air cell disease is noted.

Other: None.
IMPRESSION: No acute abnormality.

Mild atrophy and chronic microvascular ischemic change.

Atherosclerosis.

Mild ethmoid air cell disease.

## 2018-01-24 ENCOUNTER — Other Ambulatory Visit: Payer: Self-pay | Admitting: Cardiovascular Disease

## 2018-12-12 IMAGING — DX DG CHEST 1V PORT
1 series · 1 of 1 positions shown · non-contrast
Comparison: 12/22/2017

CLINICAL DATA: Shortness of breath and weakness for 2 days. Cough
for 1 month.

EXAM:
PORTABLE CHEST 1 VIEW

[chest ap]
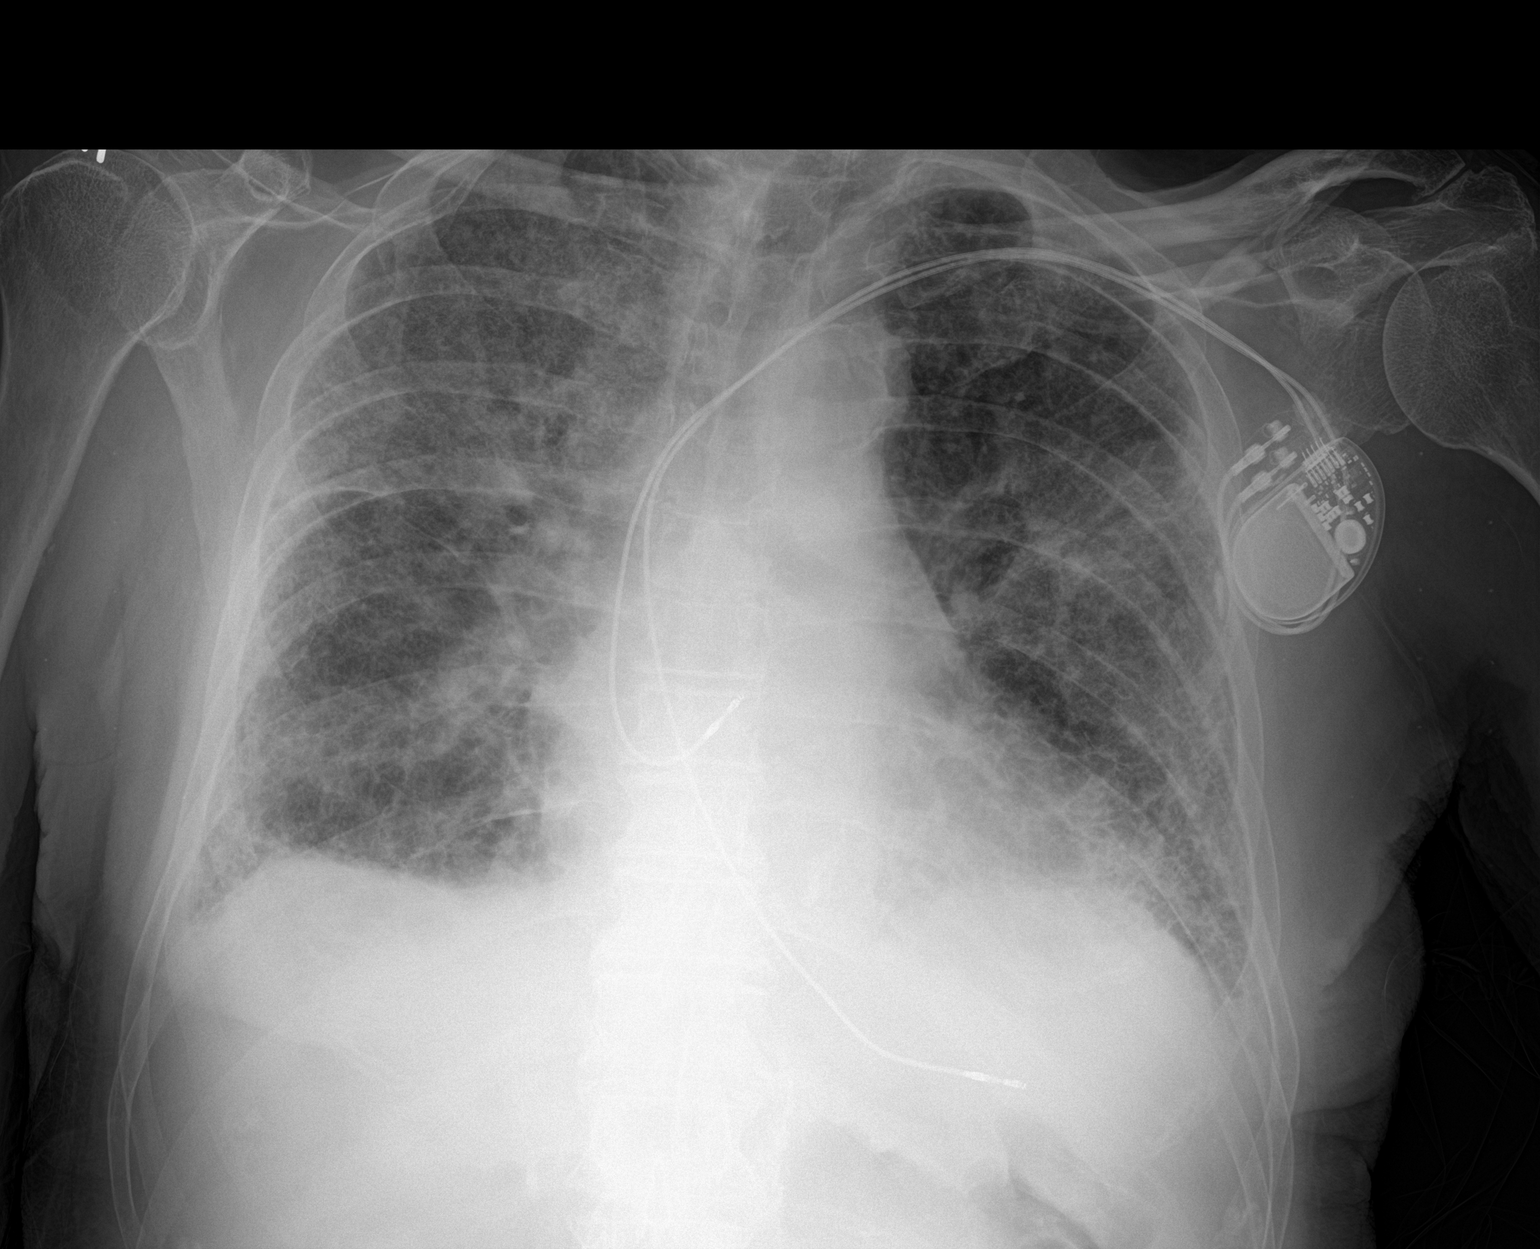

[1 of 1 positions shown; findings below may reference images not displayed]

FINDINGS: The cardiomediastinal silhouette is unchanged. Aortic
atherosclerosis is noted. A dual lead pacemaker remains in place.
Marked pulmonary fibrosis is again noted. There is new superimposed
hazy opacity diffusely throughout the right upper lobe, and there is
also likely new mild opacity in the lateral left mid lung. No
sizable pleural effusion or pneumothorax is identified. No acute
osseous abnormality is seen.
IMPRESSION: New right upper lobe and left mid lung opacities concerning for
pneumonia superimposed on diffuse fibrosis.

## 2018-12-14 IMAGING — DX DG CHEST 1V PORT
1 series · 1 of 1 positions shown · non-contrast
Comparison: Radiograph 01/01/2018.  CT 06/02/2017

CLINICAL DATA: Acute onset of shortness of breath.

EXAM:
PORTABLE CHEST 1 VIEW

[chest ap]
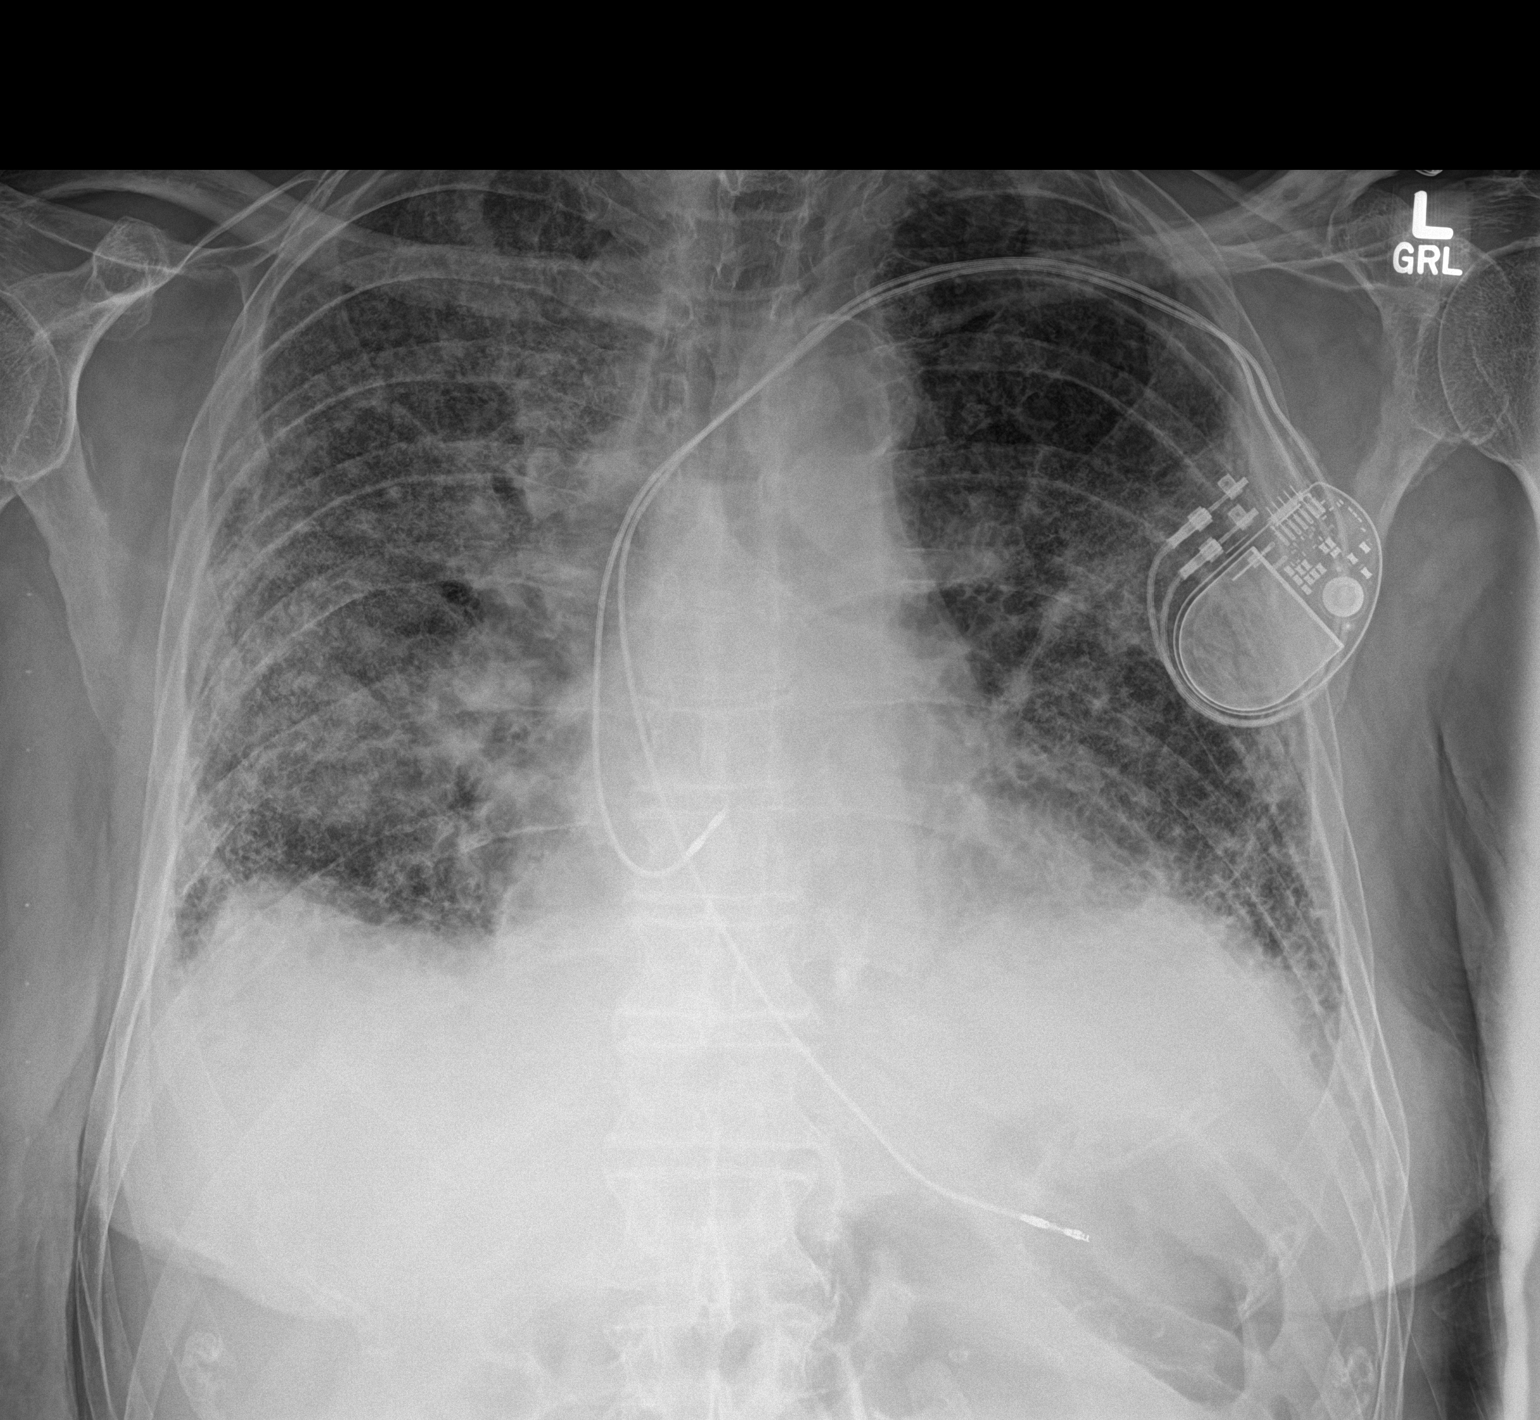

[1 of 1 positions shown; findings below may reference images not displayed]

FINDINGS: Left-sided pacemaker remains in place. Unchanged heart size and
mediastinal contours. Progressive interstitial opacities throughout
the right hemithorax. Left mid lung opacity on prior exam partially
obscured by battery pack from pacemaker. Background pulmonary
fibrosis. No confluent airspace disease. No pleural effusion or
pneumothorax.
IMPRESSION: Progressive interstitial opacities throughout the right hemithorax,
pneumonia versus pulmonary edema superimposed on chronic
interstitial lung disease. Left midlung opacity on prior exam
currently obscured by pacemaker.
